# Patient Record
Sex: Male | Born: 1950 | Race: White | Hispanic: No | Marital: Married | Smoking: Never smoker
Health system: Southern US, Community
[De-identification: ages and names within clinical notes are randomized; demographics above are authoritative.]

## PROBLEM LIST (undated history)

## (undated) DIAGNOSIS — C4490 Unspecified malignant neoplasm of skin, unspecified: Secondary | ICD-10-CM

## (undated) DIAGNOSIS — Z789 Other specified health status: Secondary | ICD-10-CM

## (undated) DIAGNOSIS — N183 Chronic kidney disease, stage 3 unspecified: Secondary | ICD-10-CM

## (undated) DIAGNOSIS — J45909 Unspecified asthma, uncomplicated: Secondary | ICD-10-CM

## (undated) DIAGNOSIS — M6530 Trigger finger, unspecified finger: Secondary | ICD-10-CM

## (undated) DIAGNOSIS — K529 Noninfective gastroenteritis and colitis, unspecified: Secondary | ICD-10-CM

## (undated) DIAGNOSIS — R55 Syncope and collapse: Secondary | ICD-10-CM

## (undated) DIAGNOSIS — Z950 Presence of cardiac pacemaker: Secondary | ICD-10-CM

## (undated) DIAGNOSIS — Z9989 Dependence on other enabling machines and devices: Secondary | ICD-10-CM

## (undated) DIAGNOSIS — K635 Polyp of colon: Secondary | ICD-10-CM

## (undated) DIAGNOSIS — N320 Bladder-neck obstruction: Secondary | ICD-10-CM

## (undated) DIAGNOSIS — M1990 Unspecified osteoarthritis, unspecified site: Secondary | ICD-10-CM

## (undated) DIAGNOSIS — C439 Malignant melanoma of skin, unspecified: Secondary | ICD-10-CM

## (undated) DIAGNOSIS — E119 Type 2 diabetes mellitus without complications: Secondary | ICD-10-CM

## (undated) DIAGNOSIS — M109 Gout, unspecified: Secondary | ICD-10-CM

## (undated) DIAGNOSIS — E785 Hyperlipidemia, unspecified: Secondary | ICD-10-CM

## (undated) DIAGNOSIS — N1330 Unspecified hydronephrosis: Secondary | ICD-10-CM

## (undated) DIAGNOSIS — I453 Trifascicular block: Secondary | ICD-10-CM

## (undated) DIAGNOSIS — I10 Essential (primary) hypertension: Secondary | ICD-10-CM

## (undated) DIAGNOSIS — G4733 Obstructive sleep apnea (adult) (pediatric): Secondary | ICD-10-CM

## (undated) HISTORY — DX: Chronic kidney disease, stage 3 unspecified: N18.30

## (undated) HISTORY — DX: Unspecified hydronephrosis: N13.30

## (undated) HISTORY — PX: SKIN CANCER EXCISION: SHX779

## (undated) HISTORY — DX: Trifascicular block: I45.3

## (undated) HISTORY — DX: Bladder-neck obstruction: N32.0

## (undated) HISTORY — DX: Malignant melanoma of skin, unspecified: C43.9

## (undated) HISTORY — DX: Chronic kidney disease, stage 3 (moderate): N18.3

## (undated) HISTORY — DX: Syncope and collapse: R55

---

## 2005-01-17 ENCOUNTER — Ambulatory Visit: Payer: Self-pay | Admitting: Family Medicine

## 2005-02-27 ENCOUNTER — Ambulatory Visit: Payer: Self-pay | Admitting: Family Medicine

## 2006-11-15 ENCOUNTER — Ambulatory Visit: Payer: Self-pay | Admitting: Dermatology

## 2007-08-22 ENCOUNTER — Ambulatory Visit: Payer: Self-pay | Admitting: Gastroenterology

## 2011-12-01 ENCOUNTER — Emergency Department: Payer: Self-pay | Admitting: Emergency Medicine

## 2011-12-14 ENCOUNTER — Ambulatory Visit: Payer: Self-pay | Admitting: Family Medicine

## 2011-12-25 HISTORY — PX: INCISION AND DRAINAGE: SHX5863

## 2012-01-08 ENCOUNTER — Ambulatory Visit: Payer: Self-pay | Admitting: Orthopedic Surgery

## 2012-01-15 ENCOUNTER — Ambulatory Visit: Payer: Self-pay | Admitting: Gastroenterology

## 2012-01-17 LAB — PATHOLOGY REPORT

## 2015-04-17 NOTE — Op Note (Signed)
PATIENT NAME:  Anthony Palmer, Anthony Palmer MR#:  [id number] DATE OF BIRTH:  December 19, 1951  DATE OF PROCEDURE:  01/08/2012  PREOPERATIVE DIAGNOSIS: Hematoma left lower leg.  POSTOPERATIVE DIAGNOSIS: Hematoma left lower leg.    PROCEDURE: Incision and drainage of hematoma left lower leg.   ANESTHESIA: General.   SURGEON: Laurene Footman, MD    PROCEDURE: The patient was brought to the operating room and after adequate anesthesia was obtained, the leg was prepped and draped in the usual sterile fashion. After patient identification and time-out procedures were completed, an approximately 1 cm incision was made just lateral to midline and over the mass which was at the proximal end of the tibia. In spreading the soft tissue, a large amount of blood clot was evacuated from the wound, 50 mL at least. After gentle pressure to evacuate this, the wound was thoroughly irrigated to remove all clot. It did appear to be adequately decompressed. There was no evidence of infection. After pulse irrigation, quarter-inch Penrose drain was placed to prevent recurrence and sutured in place with a single 4-0 nylon suture to partially close the wound. The wound was then dressed with Xeroform, 4 x 4's, Webril, and an Ace wrap for a compressive dressing. The patient was sent to the recovery room in stable condition.   ESTIMATED BLOOD LOSS: 10 mL.   COMPLICATIONS: None.   SPECIMENS: None.   ____________________________ Laurene Footman, MD mjm:drc D: 01/08/2012 19:16:43 ET T: 01/08/2012 20:08:47 ET JOB#: [id number]  cc: Laurene Footman, MD, <Dictator> Laurene Footman MD ELECTRONICALLY SIGNED 01/09/2012 17:02

## 2016-07-20 ENCOUNTER — Encounter: Payer: Self-pay | Admitting: *Deleted

## 2016-07-23 ENCOUNTER — Ambulatory Visit
Admission: RE | Admit: 2016-07-23 | Discharge: 2016-07-23 | Disposition: A | Discharge status: Home / Self Care | Payer: Commercial Managed Care - HMO | Source: Ambulatory Visit | Attending: Gastroenterology | Admitting: Gastroenterology

## 2016-07-23 ENCOUNTER — Encounter
Admission: RE | Disposition: A | Discharge status: Home / Self Care | Payer: Self-pay | Source: Ambulatory Visit | Attending: Gastroenterology

## 2016-07-23 ENCOUNTER — Ambulatory Visit: Payer: Commercial Managed Care - HMO | Admitting: Anesthesiology

## 2016-07-23 ENCOUNTER — Encounter: Payer: Self-pay | Admitting: *Deleted

## 2016-07-23 DIAGNOSIS — E119 Type 2 diabetes mellitus without complications: Secondary | ICD-10-CM | POA: Diagnosis not present

## 2016-07-23 DIAGNOSIS — Z79899 Other long term (current) drug therapy: Secondary | ICD-10-CM | POA: Diagnosis not present

## 2016-07-23 DIAGNOSIS — D123 Benign neoplasm of transverse colon: Secondary | ICD-10-CM | POA: Diagnosis not present

## 2016-07-23 DIAGNOSIS — K6389 Other specified diseases of intestine: Secondary | ICD-10-CM | POA: Insufficient documentation

## 2016-07-23 DIAGNOSIS — I10 Essential (primary) hypertension: Secondary | ICD-10-CM | POA: Insufficient documentation

## 2016-07-23 DIAGNOSIS — M1990 Unspecified osteoarthritis, unspecified site: Secondary | ICD-10-CM | POA: Diagnosis not present

## 2016-07-23 DIAGNOSIS — Z86010 Personal history of colonic polyps: Secondary | ICD-10-CM | POA: Diagnosis present

## 2016-07-23 DIAGNOSIS — K5730 Diverticulosis of large intestine without perforation or abscess without bleeding: Secondary | ICD-10-CM | POA: Insufficient documentation

## 2016-07-23 DIAGNOSIS — K529 Noninfective gastroenteritis and colitis, unspecified: Secondary | ICD-10-CM | POA: Diagnosis not present

## 2016-07-23 DIAGNOSIS — D122 Benign neoplasm of ascending colon: Secondary | ICD-10-CM | POA: Diagnosis not present

## 2016-07-23 DIAGNOSIS — N400 Enlarged prostate without lower urinary tract symptoms: Secondary | ICD-10-CM | POA: Insufficient documentation

## 2016-07-23 DIAGNOSIS — E785 Hyperlipidemia, unspecified: Secondary | ICD-10-CM | POA: Insufficient documentation

## 2016-07-23 DIAGNOSIS — Z7984 Long term (current) use of oral hypoglycemic drugs: Secondary | ICD-10-CM | POA: Insufficient documentation

## 2016-07-23 DIAGNOSIS — D124 Benign neoplasm of descending colon: Secondary | ICD-10-CM | POA: Insufficient documentation

## 2016-07-23 HISTORY — DX: Unspecified osteoarthritis, unspecified site: M19.90

## 2016-07-23 HISTORY — PX: COLONOSCOPY WITH PROPOFOL: SHX5780

## 2016-07-23 HISTORY — DX: Trigger finger, unspecified finger: M65.30

## 2016-07-23 HISTORY — DX: Hyperlipidemia, unspecified: E78.5

## 2016-07-23 HISTORY — DX: Essential (primary) hypertension: I10

## 2016-07-23 LAB — GLUCOSE, CAPILLARY: Glucose-Capillary: 113 mg/dL — ABNORMAL HIGH (ref 65–99)

## 2016-07-23 SURGERY — COLONOSCOPY WITH PROPOFOL
Anesthesia: General

## 2016-07-23 MED ORDER — PROPOFOL 10 MG/ML IV BOLUS
INTRAVENOUS | Status: DC | PRN
  Administered 2016-07-23: 30 mg via INTRAVENOUS
  Administered 2016-07-23: 70 mg via INTRAVENOUS

## 2016-07-23 MED ORDER — SODIUM CHLORIDE 0.9 % IV SOLN
INTRAVENOUS | Status: DC
  Administered 2016-07-23: 09:00:00 via INTRAVENOUS

## 2016-07-23 MED ORDER — SODIUM CHLORIDE 0.9 % IV SOLN
INTRAVENOUS | Status: DC
  Administered 2016-07-23: 08:00:00 via INTRAVENOUS

## 2016-07-23 MED ORDER — SODIUM CHLORIDE 0.9 % IV SOLN: INTRAVENOUS | Status: DC

## 2016-07-23 MED ORDER — PROPOFOL 500 MG/50ML IV EMUL
INTRAVENOUS | Status: DC | PRN
  Administered 2016-07-23: 120 ug/kg/min via INTRAVENOUS

## 2016-07-23 MED ORDER — LIDOCAINE 2% (20 MG/ML) 5 ML SYRINGE
INTRAMUSCULAR | Status: DC | PRN
  Administered 2016-07-23: 50 mg via INTRAVENOUS

## 2016-07-23 MED ORDER — MIDAZOLAM HCL 5 MG/5ML IJ SOLN
INTRAMUSCULAR | Status: DC | PRN
  Administered 2016-07-23: 1 mg via INTRAVENOUS

## 2016-07-23 MED ORDER — PHENYLEPHRINE HCL 10 MG/ML IJ SOLN
INTRAMUSCULAR | Status: DC | PRN
  Administered 2016-07-23: 100 ug via INTRAVENOUS

## 2016-07-23 NOTE — Transfer of Care (Signed)
Immediate Anesthesia Transfer of Care Note  Patient: Anthony Palmer  Procedure(s) Performed: Procedure(s): COLONOSCOPY WITH PROPOFOL (N/A)  Patient Location: Endoscopy Unit  Anesthesia Type:General  Level of Consciousness: awake  Airway & Oxygen Therapy: Patient Spontanous Breathing and Patient connected to nasal cannula oxygen  Post-op Assessment: Report given to RN and Post -op Vital signs reviewed and stable  Post vital signs: Reviewed  Last Vitals:  Vitals:   07/23/16 0802 07/23/16 1027  BP: 136/84 105/70  Pulse: 62 66  Resp: 18 18  Temp: 36.1 C     Last Pain:  Vitals:   07/23/16 0802  TempSrc: Tympanic         Complications: No apparent anesthesia complications

## 2016-07-23 NOTE — H&P (Signed)
Outpatient short stay form Pre-procedure 07/23/2016 8:53 AM Anthony Sails MD  Primary Physician: Dr. [place] Pink  Reason for visit:  Colonoscopy  History of present illness:  Patient is a 65 year old male presenting today for colonoscopy. He has personal history of adenomatous colon polyps. His last colonoscopy was in January 2013. He tolerated his prep well. He takes no aspirin or blood thinning agents.    Current Facility-Administered Medications:  .  0.9 %  sodium chloride infusion, , Intravenous, Continuous, Anthony Silvas, MD .  0.9 %  sodium chloride infusion, , Intravenous, Continuous, Anthony Sails, MD, Last Rate: 50 mL/hr at 07/23/16 [id number] .  0.9 %  sodium chloride infusion, , Intravenous, Continuous, Anthony Sails, MD  Prescriptions Prior to Admission  Medication Sig Dispense Refill Last Dose  . allopurinol (ZYLOPRIM) 300 MG tablet Take 300 mg by mouth daily.   07/22/2016 at Unknown time  . enalapril (VASOTEC) 20 MG tablet Take 20 mg by mouth daily.   07/22/2016 at Unknown time  . fluticasone (FLONASE) 50 MCG/ACT nasal spray Place 2 sprays into both nostrils daily.   07/22/2016 at Unknown time  . lovastatin (MEVACOR) 40 MG tablet Take 40 mg by mouth at bedtime.   07/22/2016 at Unknown time  . metFORMIN (GLUCOPHAGE) 500 MG tablet Take by mouth 2 (two) times daily with a meal.   Past Week at Unknown time  . tamsulosin (FLOMAX) 0.4 MG CAPS capsule Take 0.4 mg by mouth daily.   07/22/2016 at Unknown time  . cefUROXime (CEFTIN) 250 MG tablet Take 250 mg by mouth 2 (two) times daily with a meal.   Completed Course at Unknown time     No Known Allergies   Past Medical History:  Diagnosis Date  . Arthritis   . Diabetes mellitus without complication ([place])   . Hyperlipidemia   . Hypertension   . Trigger finger, acquired     Review of systems:      Physical Exam    Heart and lungs: Regular rate and rhythm without rub or gallop, lungs are bilaterally clear.   HEENT: Normocephalic atraumatic eyes are anicteric    Other:     Pertinant exam for procedure: Soft nontender nondistended bowel sounds positive normoactive.    Planned proceedures: Colonoscopy and indicated procedures. I have discussed the risks benefits and complications of procedures to include not limited to bleeding, infection, perforation and the risk of sedation and the patient wishes to proceed. I have discussed the risks benefits and complications of procedures to include not limited to bleeding, infection, perforation and the risk of sedation and the patient wishes to proceed.    Anthony Sails, MD Gastroenterology 07/23/2016  8:53 AM

## 2016-07-23 NOTE — Op Note (Signed)
[facility] Gastroenterology Patient Name: Anthony Palmer Procedure Date: 07/23/2016 8:57 AM MRN: [id number] Account #: [id number] Date of Birth: [birth year] Admit Type: Outpatient Age: 65 Room: [facility] ENDO ROOM 1 Gender: Male Note Status: Finalized Procedure:            Colonoscopy Indications:          Personal history of colonic polyps Providers:            Lollie Sails, MD Referring MD:         Irven Easterly. Kary Kos, MD (Referring MD) Medicines:            Monitored Anesthesia Care Complications:        No immediate complications. Procedure:            Pre-Anesthesia Assessment:                       - ASA Grade Assessment: III - A patient with severe                        systemic disease.                       After obtaining informed consent, the colonoscope was                        passed under direct vision. Throughout the procedure,                        the patient's blood pressure, pulse, and oxygen                        saturations were monitored continuously. The                        Colonoscope was introduced through the anus and                        advanced to the the cecum, identified by appendiceal                        orifice and ileocecal valve. The colonoscopy was                        unusually difficult due to significant looping, a                        tortuous colon and the patient's body habitus.                        Successful completion of the procedure was aided by                        changing the patient to a supine position, changing the                        patient to a prone position and using manual pressure.                        The patient tolerated the procedure well. The quality  of the bowel preparation was fair except the cecum was                        poor. Findings:      The sigmoid colon, descending colon and transverse colon were       significantly redundant.      Multiple  small and large-mouthed diverticula were found in the sigmoid       colon, descending colon, transverse colon and ascending colon.      A 4 mm polyp was found in the descending colon. The polyp was sessile.       The polyp was removed with a cold snare. Resection and retrieval were       complete.      A 2 mm polyp was found in the descending colon. The polyp was sessile.       The polyp was removed with a cold biopsy forceps. Resection and       retrieval were complete.      A 12 mm polyp was found in the descending colon. The polyp was       pedunculated. The polyp was removed with a hot snare. Resection and       retrieval were complete. To prevent bleeding after the polypectomy, one       hemostatic clip was successfully placed. There was no bleeding at the       end of the maneuver.      A 3 mm polyp was found in the hepatic flexure. The polyp was sessile.       The polyp was removed with a hot snare. Resection and retrieval were       complete. To prevent bleeding after the polypectomy, one hemostatic clip       was successfully placed. There was no bleeding at the end of the       maneuver. The polyp was removed with a cold biopsy forceps. Resection       and retrieval were complete.      A 5 mm polyp was found in the ascending colon. The polyp was       semi-pedunculated. The polyp was removed with a cold snare. Resection       and retrieval were complete. To prevent bleeding after the polypectomy,       one hemostatic clip was successfully placed. There was no bleeding at       the end of the maneuver.      A diffuse area of granular mucosa was found at the ileocecal valve.       Biopsies were taken with a cold forceps for histology.      A 6 mm polyp was found in the distal descending colon. The polyp was       semi-pedunculated. The polyp was removed with a cold snare. Resection       and retrieval were complete. To prevent bleeding after the polypectomy,       one hemostatic  clip was successfully placed. There was no bleeding at       the end of the maneuver.      prominant rectal vasculature      The digital rectal exam findings include enlarged prostate. Impression:           - Redundant colon.                       -  Diverticulosis in the sigmoid colon, in the                        descending colon, in the transverse colon and in the                        ascending colon.                       - One 4 mm polyp in the descending colon, removed with                        a cold snare. Resected and retrieved.                       - One 2 mm polyp in the descending colon, removed with                        a cold biopsy forceps. Resected and retrieved.                       - One 12 mm polyp in the descending colon, removed with                        a hot snare. Resected and retrieved. Clip was placed.                       - One 3 mm polyp at the hepatic flexure, removed with a                        hot snare and removed with a cold biopsy forceps.                        Resected and retrieved. Clip was placed.                       - One 5 mm polyp in the ascending colon, removed with a                        cold snare. Resected and retrieved. Clip was placed.                       - Granularity at the ileocecal valve. Biopsied.                       - One 6 mm polyp in the distal descending colon,                        removed with a cold snare. Resected and retrieved. Clip                        was placed.                       - Enlarged prostate found on digital rectal exam. Recommendation:       - Discharge patient to home.                       - Await pathology results.  Procedure Code(s):    --- Professional ---                       [phone], Colonoscopy, flexible; with removal of tumor(s),                        polyp(s), or other lesion(s) by snare technique                       45380, 84, Colonoscopy, flexible; with biopsy, single                         or multiple Diagnosis Code(s):    --- Professional ---                       D12.3, Benign neoplasm of transverse colon (hepatic                        flexure or splenic flexure)                       D12.2, Benign neoplasm of ascending colon                       D12.4, Benign neoplasm of descending colon                       K63.89, Other specified diseases of intestine                       Z86.010, Personal history of colonic polyps                       K57.30, Diverticulosis of large intestine without                        perforation or abscess without bleeding                       N40.0, Benign prostatic hyperplasia without lower                        urinary tract symptoms                       Q43.8, Other specified congenital malformations of                        intestine CPT copyright 2016 American Medical Association. All rights reserved. The codes documented in this report are preliminary and upon coder review may  be revised to meet current compliance requirements. Lollie Sails, MD 07/23/2016 10:37:36 AM This report has been signed electronically. Number of Addenda: 0 Note Initiated On: 07/23/2016 8:57 AM Scope Withdrawal Time: 0 hours 25 minutes 41 seconds  Total Procedure Duration: 1 hour 13 minutes 13 seconds       [facility]

## 2016-07-23 NOTE — Anesthesia Postprocedure Evaluation (Signed)
Anesthesia Post Note  Patient: Anthony Palmer  Procedure(s) Performed: Procedure(s) (LRB): COLONOSCOPY WITH PROPOFOL (N/A)  Patient location during evaluation: PACU Anesthesia Type: General Level of consciousness: awake Pain management: pain level controlled Vital Signs Assessment: post-procedure vital signs reviewed and stable Respiratory status: nonlabored ventilation Cardiovascular status: stable Anesthetic complications: no    Last Vitals:  Vitals:   07/23/16 1026 07/23/16 1027  BP: 105/70 105/70  Pulse: 69 66  Resp: 19 18  Temp: (!) 35.7 C     Last Pain:  Vitals:   07/23/16 1026  TempSrc: Tympanic                 VAN STAVEREN,Yaretzy Olazabal

## 2016-07-23 NOTE — Anesthesia Preprocedure Evaluation (Signed)
Anesthesia Evaluation  Patient identified by MRN, date of birth, ID band Patient awake    Reviewed: Allergy & Precautions, NPO status , Patient's Chart, lab work & pertinent test results  Airway Mallampati: II       Dental  (+) Teeth Intact   Pulmonary neg pulmonary ROS,    breath sounds clear to auscultation       Cardiovascular Exercise Tolerance: Good hypertension, Pt. on medications  Rhythm:Regular     Neuro/Psych    GI/Hepatic negative GI ROS, Neg liver ROS,   Endo/Other  diabetes, Type 2, Oral Hypoglycemic Agents  Renal/GU negative Renal ROS     Musculoskeletal   Abdominal (+) + obese,   Peds  Hematology   Anesthesia Other Findings   Reproductive/Obstetrics                             Anesthesia Physical Anesthesia Plan  ASA: III  Anesthesia Plan: General   Post-op Pain Management:    Induction: Intravenous  Airway Management Planned: Natural Airway and Nasal Cannula  Additional Equipment:   Intra-op Plan:   Post-operative Plan:   Informed Consent: I have reviewed the patients History and Physical, chart, labs and discussed the procedure including the risks, benefits and alternatives for the proposed anesthesia with the patient or authorized representative who has indicated his/her understanding and acceptance.     Plan Discussed with: CRNA  Anesthesia Plan Comments:         Anesthesia Quick Evaluation

## 2016-07-24 ENCOUNTER — Encounter: Payer: Self-pay | Admitting: Gastroenterology

## 2016-07-24 LAB — SURGICAL PATHOLOGY

## 2017-09-21 ENCOUNTER — Emergency Department: Payer: 59

## 2017-09-21 ENCOUNTER — Inpatient Hospital Stay
Admission: EM | Admit: 2017-09-21 | Discharge: 2017-09-25 | DRG: 684 | Disposition: A | Discharge status: Home / Self Care | Payer: 59 | Attending: Internal Medicine | Admitting: Internal Medicine

## 2017-09-21 ENCOUNTER — Encounter: Payer: Self-pay | Admitting: Emergency Medicine

## 2017-09-21 DIAGNOSIS — G4733 Obstructive sleep apnea (adult) (pediatric): Secondary | ICD-10-CM | POA: Diagnosis not present

## 2017-09-21 DIAGNOSIS — N179 Acute kidney failure, unspecified: Principal | ICD-10-CM | POA: Diagnosis present

## 2017-09-21 DIAGNOSIS — E119 Type 2 diabetes mellitus without complications: Secondary | ICD-10-CM

## 2017-09-21 DIAGNOSIS — E785 Hyperlipidemia, unspecified: Secondary | ICD-10-CM | POA: Diagnosis not present

## 2017-09-21 DIAGNOSIS — I129 Hypertensive chronic kidney disease with stage 1 through stage 4 chronic kidney disease, or unspecified chronic kidney disease: Secondary | ICD-10-CM | POA: Diagnosis not present

## 2017-09-21 DIAGNOSIS — N281 Cyst of kidney, acquired: Secondary | ICD-10-CM | POA: Diagnosis present

## 2017-09-21 DIAGNOSIS — R310 Gross hematuria: Secondary | ICD-10-CM | POA: Diagnosis present

## 2017-09-21 DIAGNOSIS — N1330 Unspecified hydronephrosis: Secondary | ICD-10-CM | POA: Diagnosis not present

## 2017-09-21 DIAGNOSIS — M109 Gout, unspecified: Secondary | ICD-10-CM

## 2017-09-21 DIAGNOSIS — N183 Chronic kidney disease, stage 3 unspecified: Secondary | ICD-10-CM

## 2017-09-21 DIAGNOSIS — Z7951 Long term (current) use of inhaled steroids: Secondary | ICD-10-CM

## 2017-09-21 DIAGNOSIS — Z79899 Other long term (current) drug therapy: Secondary | ICD-10-CM | POA: Diagnosis not present

## 2017-09-21 DIAGNOSIS — Z7984 Long term (current) use of oral hypoglycemic drugs: Secondary | ICD-10-CM | POA: Diagnosis not present

## 2017-09-21 DIAGNOSIS — N19 Unspecified kidney failure: Secondary | ICD-10-CM

## 2017-09-21 DIAGNOSIS — E1122 Type 2 diabetes mellitus with diabetic chronic kidney disease: Secondary | ICD-10-CM | POA: Diagnosis not present

## 2017-09-21 DIAGNOSIS — R32 Unspecified urinary incontinence: Secondary | ICD-10-CM | POA: Diagnosis present

## 2017-09-21 DIAGNOSIS — R55 Syncope and collapse: Secondary | ICD-10-CM | POA: Diagnosis present

## 2017-09-21 DIAGNOSIS — N320 Bladder-neck obstruction: Secondary | ICD-10-CM | POA: Diagnosis not present

## 2017-09-21 DIAGNOSIS — I493 Ventricular premature depolarization: Secondary | ICD-10-CM | POA: Diagnosis present

## 2017-09-21 DIAGNOSIS — I4510 Unspecified right bundle-branch block: Secondary | ICD-10-CM | POA: Diagnosis present

## 2017-09-21 DIAGNOSIS — N3289 Other specified disorders of bladder: Secondary | ICD-10-CM | POA: Diagnosis not present

## 2017-09-21 DIAGNOSIS — I10 Essential (primary) hypertension: Secondary | ICD-10-CM

## 2017-09-21 LAB — TROPONIN I: Troponin I: <0.03 ng/mL (ref <0.03)

## 2017-09-21 LAB — BASIC METABOLIC PANEL
Anion gap: 9 (ref 5–15)
BUN: 45 mg/dL — ABNORMAL HIGH (ref 6–20)
CHLORIDE: 107 mmol/L (ref 101–111)
CO2: 22 mmol/L (ref 22–32)
Calcium: 8.9 mg/dL (ref 8.9–10.3)
Creatinine, Ser: 2.52 mg/dL — ABNORMAL HIGH (ref 0.61–1.24)
GFR calc Af Amer: 29 mL/min — ABNORMAL LOW (ref >60)
GFR calc non Af Amer: 25 mL/min — ABNORMAL LOW (ref >60)
Glucose, Bld: 104 mg/dL — ABNORMAL HIGH (ref 65–99)
POTASSIUM: 4.3 mmol/L (ref 3.5–5.1)
SODIUM: 138 mmol/L (ref 135–145)

## 2017-09-21 LAB — CBC
HCT: 38 % — ABNORMAL LOW (ref 40.0–52.0)
Hemoglobin: 12.8 g/dL — ABNORMAL LOW (ref 13.0–18.0)
MCH: 30.2 pg (ref 26.0–34.0)
MCHC: 33.6 g/dL (ref 32.0–36.0)
MCV: 90 fL (ref 80.0–100.0)
Platelets: 260 10*3/uL (ref 150–440)
RBC: 4.23 MIL/uL — AB (ref 4.40–5.90)
RDW: 14.8 % — ABNORMAL HIGH (ref 11.5–14.5)
WBC: 9.8 10*3/uL (ref 3.8–10.6)

## 2017-09-21 MED ORDER — HEPARIN SODIUM (PORCINE) 5000 UNIT/ML IJ SOLN
5000.0000 [IU] | Freq: Three times a day (TID) | INTRAMUSCULAR | Status: DC
  Administered 2017-09-22 – 2017-09-24 (×10): 5000 [IU] via SUBCUTANEOUS
  Filled 2017-09-21 (×11): qty 1

## 2017-09-21 MED ORDER — ACETAMINOPHEN 325 MG PO TABS: 650.0000 mg | ORAL_TABLET | Freq: Four times a day (QID) | ORAL | Status: DC | PRN

## 2017-09-21 MED ORDER — GLIPIZIDE ER 2.5 MG PO TB24
2.5000 mg | ORAL_TABLET | Freq: Every day | ORAL | Status: DC
  Filled 2017-09-21: qty 1

## 2017-09-21 MED ORDER — TAMSULOSIN HCL 0.4 MG PO CAPS
0.4000 mg | ORAL_CAPSULE | Freq: Every day | ORAL | Status: DC
  Administered 2017-09-22 – 2017-09-25 (×4): 0.4 mg via ORAL
  Filled 2017-09-21 (×4): qty 1

## 2017-09-21 MED ORDER — LOSARTAN POTASSIUM-HCTZ 100-12.5 MG PO TABS: 1.0000 | ORAL_TABLET | Freq: Every day | ORAL | Status: DC

## 2017-09-21 MED ORDER — ACETAMINOPHEN 650 MG RE SUPP: 650.0000 mg | Freq: Four times a day (QID) | RECTAL | Status: DC | PRN

## 2017-09-21 MED ORDER — ALLOPURINOL 300 MG PO TABS
300.0000 mg | ORAL_TABLET | Freq: Every day | ORAL | Status: DC
  Administered 2017-09-22 – 2017-09-25 (×4): 300 mg via ORAL
  Filled 2017-09-21 (×4): qty 1

## 2017-09-21 MED ORDER — ONDANSETRON HCL 4 MG PO TABS: 4.0000 mg | ORAL_TABLET | Freq: Four times a day (QID) | ORAL | Status: DC | PRN

## 2017-09-21 MED ORDER — ONDANSETRON HCL 4 MG/2ML IJ SOLN: 4.0000 mg | Freq: Four times a day (QID) | INTRAMUSCULAR | Status: DC | PRN

## 2017-09-21 MED ORDER — INSULIN ASPART 100 UNIT/ML ~~LOC~~ SOLN: 0.0000 [IU] | Freq: Every day | SUBCUTANEOUS | Status: DC

## 2017-09-21 MED ORDER — SODIUM CHLORIDE 0.9 % IV SOLN
INTRAVENOUS | Status: DC
  Administered 2017-09-22 – 2017-09-25 (×3): via INTRAVENOUS

## 2017-09-21 MED ORDER — INSULIN ASPART 100 UNIT/ML ~~LOC~~ SOLN
0.0000 [IU] | Freq: Three times a day (TID) | SUBCUTANEOUS | Status: DC
  Administered 2017-09-24: 2 [IU] via SUBCUTANEOUS
  Filled 2017-09-21: qty 1

## 2017-09-21 MED ORDER — FLUTICASONE PROPIONATE 50 MCG/ACT NA SUSP
2.0000 | Freq: Every day | NASAL | Status: DC
  Filled 2017-09-21: qty 16

## 2017-09-21 MED ORDER — POLYETHYL GLYCOL-PROPYL GLYCOL 0.4-0.3 % OP GEL
1.0000 "application " | Freq: Every day | OPHTHALMIC | Status: DC
  Filled 2017-09-21: qty 10

## 2017-09-21 MED ORDER — ASPIRIN EC 81 MG PO TBEC
81.0000 mg | DELAYED_RELEASE_TABLET | Freq: Every day | ORAL | Status: DC
  Administered 2017-09-22 – 2017-09-25 (×4): 81 mg via ORAL
  Filled 2017-09-21 (×4): qty 1

## 2017-09-21 MED ORDER — POLYETHYLENE GLYCOL 3350 17 G PO PACK: 17.0000 g | PACK | Freq: Every day | ORAL | Status: DC | PRN

## 2017-09-21 NOTE — ED Triage Notes (Signed)
Pt to rm 4 [facility], report syncopal episode, hit head, hematoma noted to posterior head.  Pt reports this occurring a couple times over past couple weeks.  Pt reports change in DM and HTN medications 1 month ago, stopped metformin and started glipizide, stopped enalapril and started losartan.  Pt NAD at this time.  Pt had syncopal episode while in bed, lasted about 10 seconds, had snoring respirations and body twitched mildly

## 2017-09-21 NOTE — ED Provider Notes (Signed)
[facility] Emergency Department Provider Note  ____________________________________________   First MD Initiated Contact with Patient 09/21/17 2103     (approximate)  I have reviewed the triage vital signs and the nursing notes.   HISTORY  Chief Complaint Loss of Consciousness   HPI Anthony Palmer is a 66 y.o. male with history of diabetes and hypertension who is presenting to the emergency department with several episodes of loss of consciousness. He says that he has been getting feelings in his chest which rise up to his neck which she feels as a wave of warmth. He has been lost consciousness for several seconds. He had an episode while in the emergency department and was witnessed by his nurse where he had snoring respirations for several seconds and then regained consciousness. He had no prolonged episode of confusion. He did not lose bowel or bladder continence. He denies any shortness of breath, chest pain nausea or vomiting. He says that he has been eating and drinking normally. No diarrhea.   Past Medical History:  Diagnosis Date  . Arthritis   . Diabetes mellitus without complication ([place])   . Hyperlipidemia   . Hypertension   . Trigger finger, acquired     There are no active problems to display for this patient.   Past Surgical History:  Procedure Laterality Date  . COLONOSCOPY WITH PROPOFOL N/A 07/23/2016   Procedure: COLONOSCOPY WITH PROPOFOL;  Surgeon: Lollie Sails, MD;  Location: [facility] ENDOSCOPY;  Service: Endoscopy;  Laterality: N/A;    Prior to Admission medications   Medication Sig Start Date End Date Taking? Authorizing Provider  allopurinol (ZYLOPRIM) 300 MG tablet Take 300 mg by mouth daily.    [provider]  cefUROXime (CEFTIN) 250 MG tablet Take 250 mg by mouth 2 (two) times daily with a meal.    [provider]  enalapril (VASOTEC) 20 MG tablet Take 20 mg by mouth daily.    [provider]    fluticasone (FLONASE) 50 MCG/ACT nasal spray Place 2 sprays into both nostrils daily.    [provider]  lovastatin (MEVACOR) 40 MG tablet Take 40 mg by mouth at bedtime.    [provider]  metFORMIN (GLUCOPHAGE) 500 MG tablet Take by mouth 2 (two) times daily with a meal.    [provider]  tamsulosin (FLOMAX) 0.4 MG CAPS capsule Take 0.4 mg by mouth daily.    [provider]    Allergies Patient has no known allergies.  History reviewed. No pertinent family history.  Social History Social History  Substance Use Topics  . Smoking status: Never Smoker  . Smokeless tobacco: Never Used  . Alcohol use No    Review of Systems  Constitutional: No fever/chills Eyes: No visual changes. ENT: No sore throat. Cardiovascular: Denies chest pain. Respiratory: Denies shortness of breath. Gastrointestinal: No abdominal pain.  No nausea, no vomiting.  No diarrhea.  No constipation. Genitourinary: Negative for dysuria. Musculoskeletal: Negative for back pain. Skin: Negative for rash. Neurological: Negative for headaches, focal weakness or numbness.   ____________________________________________   PHYSICAL EXAM:  VITAL SIGNS: ED Triage Vitals  Enc Vitals Group     BP 09/21/17 2058 (!) 164/97     Pulse Rate 09/21/17 2058 80     Resp 09/21/17 2058 18     Temp 09/21/17 2058 98.2 F (36.8 C)     Temp Source 09/21/17 2058 Oral     SpO2 09/21/17 2058 100 %  Weight 09/21/17 2059 240 lb (108.9 kg)     Height 09/21/17 2059 5\' 8"  (1.727 m)     Head Circumference --      Peak Flow --      Pain Score 09/21/17 2053 8     Pain Loc --      Pain Edu? --      Excl. in [place]? --     Constitutional: Alert and oriented. Well appearing and in no acute distress. Eyes: Conjunctivae are normal.  Head: Atraumatic. Nose: No congestion/rhinnorhea. Mouth/Throat: Mucous membranes are moist. no tongue bite. Neck: No stridor.   Cardiovascular: Normal rate,  regular rhythm. Grossly normal heart sounds.  Good peripheral circulation with equal bilateral radial pulses. Respiratory: Normal respiratory effort.  No retractions. Lungs CTAB. Gastrointestinal: Soft and nontender. No distention.  Musculoskeletal: No lower extremity tenderness nor edema.  No joint effusions. Neurologic:  Normal speech and language. No gross focal neurologic deficits are appreciated. Skin:  Skin is warm, dry and intact. No rash noted. Psychiatric: Mood and affect are normal. Speech and behavior are normal.  ____________________________________________   LABS (all labs ordered are listed, but only abnormal results are displayed)  Labs Reviewed  BASIC METABOLIC PANEL - Abnormal; Notable for the following:       Result Value   Glucose, Bld 104 (*)    BUN 45 (*)    Creatinine, Ser 2.52 (*)    GFR calc non Af Amer 25 (*)    GFR calc Af Amer 29 (*)    All other components within normal limits  CBC - Abnormal; Notable for the following:    RBC 4.23 (*)    Hemoglobin 12.8 (*)    HCT 38.0 (*)    RDW 14.8 (*)    All other components within normal limits  TROPONIN I  URINALYSIS, COMPLETE (UACMP) WITH MICROSCOPIC   ____________________________________________  EKG  ED ECG REPORT I, Doran Stabler, the attending physician, personally viewed and interpreted this ECG.   Date: 09/21/2017  EKG Time: 2057  Rate: 81  Rhythm: normal sinus rhythm  Axis: normal  Intervals:right bundle branch block  ST&T Change: LVH with ICD and secondary repolarization abnormality. No sniffing and ST elevation or depression. T-wave inversion in aVL. T-wave inversion in V2. EKG machine read the EKG as an inferior MI. However, I disagree with the elevation of the ST segments here and there is no cervical depression. Furthermore, the patient is calm in a symptomatic at this time.  ____________________________________________  RADIOLOGY  CT head without any acute pathology. Chest x-ray  without any acute pathology. ____________________________________________   PROCEDURES  Procedure(s) performed:   Procedures  Critical Care performed:   ____________________________________________   INITIAL IMPRESSION / ASSESSMENT AND PLAN / ED COURSE  Pertinent labs & imaging results that were available during my care of the patient were reviewed by me and considered in my medical decision making (see chart for details).  DX: Syncope, seizure, ACS    ----------------------------------------- 9:54 PM on 09/21/2017 -----------------------------------------  Patient without any complaints at this time. Intermittent PVCs on the monitor. Multiple episodes of syncope with presyncopal sensation. The patient's wife is also saying that he started having these episodes around the time he started low started about a month ago and that she has noted that arrhythmia is a side effect of losartan. Possible association. Patient will be admitted to the hospital. Signed out to Dr. Claria Dice.  patient is understanding the plan for admission a month to  comply.  ____________________________________________   FINAL CLINICAL IMPRESSION(S) / ED DIAGNOSES  syncope    NEW MEDICATIONS STARTED DURING THIS VISIT:  New Prescriptions   No medications on file     Note:  This document was prepared using Dragon voice recognition software and may include unintentional dictation errors.     Orbie Pyo, MD 09/21/17 2155

## 2017-09-21 NOTE — ED Notes (Signed)
Christine RN, aware of bed assigned.

## 2017-09-21 NOTE — H&P (Signed)
PCP:   [place] Pink, MD   Chief Complaint:  Syncope  HPI: This is a 66 year old male who states that since Sunday he felt as though  WAVE was washing up over him, he states he had a brief episode of loss of consciousness. He was sitting on his couch therefore did not fall, he felt better after. Today he was AT [place] with his family and this recurred, he fell and hit his head. He does not remember falling. This recurred in ER, he had a brief recurrent episode that lasted approximately 10 seconds. It was witnessed. There is no reports of any seizure activity, he was not postictal, he was not incontinent. He denies any chest pains. Of note he's had had 2 changes in medications recently. His metformin was changed to glipizide and his ACE inhibitor was changed to losartan. He denies being lightheaded or dizzy. He states prior to these episodes he denies any fever, chills, nausea, vomiting, diarrhea, altered mentation, burning on urination, weight loss. He is in good health. History provided by the patient as well as his wife and children present at bedside. Last week he states PCP, he was told he had chronic renal disease, nephrology follow-up was arranged  Review of Systems:  The patient denies anorexia, fever, weight loss,, vision loss, decreased hearing, hoarseness, chest pain, syncope, dyspnea on exertion, peripheral edema, balance deficits, hemoptysis, abdominal pain, melena, hematochezia, severe indigestion/heartburn, hematuria, incontinence, syncope, genital sores, muscle weakness, suspicious skin lesions, transient blindness, difficulty walking, depression, unusual weight change, abnormal bleeding, enlarged lymph nodes, angioedema, and breast masses.  Past Medical History: Past Medical History:  Diagnosis Date  . Arthritis   . Diabetes mellitus without complication ([place])   . Hyperlipidemia   . Hypertension   . Trigger finger, acquired    Past Surgical History:  Procedure Laterality Date   . COLONOSCOPY WITH PROPOFOL N/A 07/23/2016   Procedure: COLONOSCOPY WITH PROPOFOL;  Surgeon: Lollie Sails, MD;  Location: [facility] ENDOSCOPY;  Service: Endoscopy;  Laterality: N/A;    Medications: Prior to Admission medications   Medication Sig Start Date End Date Taking? Authorizing Provider  allopurinol (ZYLOPRIM) 300 MG tablet Take 300 mg by mouth daily.   Yes [provider]  glipiZIDE (GLUCOTROL XL) 2.5 MG 24 hr tablet Take 2.5 mg by mouth daily with breakfast.   Yes [provider]  losartan-hydrochlorothiazide (HYZAAR) 100-12.5 MG tablet Take 1 tablet by mouth daily. 08/20/17 08/20/18 Yes [provider]  lovastatin (MEVACOR) 40 MG tablet Take 40 mg by mouth at bedtime.   Yes [provider]  Polyethyl Glycol-Propyl Glycol (SYSTANE OP) Apply 1 drop to eye daily.   Yes [provider]  Polyethyl Glycol-Propyl Glycol (SYSTANE) 0.4-0.3 % GEL ophthalmic gel Place 1 application into both eyes daily.   Yes [provider]  tamsulosin (FLOMAX) 0.4 MG CAPS capsule Take 0.4 mg by mouth daily.   Yes [provider]  cefUROXime (CEFTIN) 250 MG tablet Take 250 mg by mouth 2 (two) times daily with a meal.    [provider]  fluticasone (FLONASE) 50 MCG/ACT nasal spray Place 2 sprays into both nostrils daily.    [provider]    Allergies:  No Known Allergies  Social History:  reports that he has never smoked. He has never used smokeless tobacco. He reports that he does not drink alcohol or use drugs.  Family History: History reviewed.Coronary artery disease  Physical Exam: Vitals:   09/21/17 2059 09/21/17 2130 09/21/17 2200  09/21/17 2230  BP:  (!) 140/97 126/89   Pulse:  97 97 96  Resp:  16 20 (!) 22  Temp:      TempSrc:      SpO2:  100% 100% 97%  Weight: 108.9 kg (240 lb)     Height: 5\' 8"  (1.727 m)       General:  Alert and oriented times three, well developed and nourished, no acute  distress Eyes: PERRLA, pink conjunctiva, no scleral icterus ENT: Moist oral mucosa, neck supple, no thyromegaly Lungs: clear to ascultation, no wheeze, no crackles, no use of accessory muscles Cardiovascular: regular rate and rhythm, no regurgitation, no gallops, no murmurs. No carotid bruits, no JVD Abdomen: soft, positive BS, non-tender, non-distended, no organomegaly, not an acute abdomen GU: not examined Neuro: CN II - XII grossly intact, sensation intact Musculoskeletal: strength 5/5 all extremities, no clubbing, cyanosis or edema Skin: no rash, no subcutaneous crepitation, no decubitus Psych: appropriate patient   Labs on Admission:   Recent Labs  09/21/17 2103  NA 138  K 4.3  CL 107  CO2 22  GLUCOSE 104*  BUN 45*  CREATININE 2.52*  CALCIUM 8.9   No results for input(s): AST, ALT, ALKPHOS, BILITOT, PROT, ALBUMIN in the last 72 hours. No results for input(s): LIPASE, AMYLASE in the last 72 hours.  Recent Labs  09/21/17 2103  WBC 9.8  HGB 12.8*  HCT 38.0*  MCV 90.0  PLT 260    Recent Labs  09/21/17 2103  TROPONINI <0.03   Invalid input(s): POCBNP No results for input(s): DDIMER in the last 72 hours. No results for input(s): HGBA1C in the last 72 hours. No results for input(s): CHOL, HDL, LDLCALC, TRIG, CHOLHDL, LDLDIRECT in the last 72 hours. No results for input(s): TSH, T4TOTAL, T3FREE, THYROIDAB in the last 72 hours.  Invalid input(s): FREET3 No results for input(s): VITAMINB12, FOLATE, FERRITIN, TIBC, IRON, RETICCTPCT in the last 72 hours.  Micro Results: No results found for this or any previous visit (from the past 240 hour(s)).   Radiological Exams on Admission: Dg Chest 1 View  Result Date: 09/21/2017 CLINICAL DATA:  Syncope, weakness. EXAM: CHEST 1 VIEW COMPARISON:  Radiographs of November 15, 2006. FINDINGS: The heart size and mediastinal contours are within normal limits. Both lungs are clear. No pneumothorax or pleural effusion is noted.  The visualized skeletal structures are unremarkable. IMPRESSION: No acute cardiopulmonary abnormality seen. Electronically Signed   By: Marijo Conception, M.D.   On: 09/21/2017 21:30   Ct Head Wo Contrast  Result Date: 09/21/2017 CLINICAL DATA:  Head trauma. EXAM: CT HEAD WITHOUT CONTRAST TECHNIQUE: Contiguous axial images were obtained from the base of the skull through the vertex without intravenous contrast. COMPARISON:  None. FINDINGS: Brain: No evidence of acute infarction, hemorrhage, hydrocephalus, extra-axial collection or mass lesion/mass effect. Vascular: No hyperdense vessel or unexpected calcification. Skull: Normal. Negative for fracture or focal lesion. Sinuses/Orbits: No acute finding. Other: Left posterior scout hematoma is identified. IMPRESSION: 1. No acute intracranial abnormalities. 2. Left posterior scalp hematoma. Electronically Signed   By: Kerby Moors M.D.   On: 09/21/2017 21:51    Assessment/Plan Present on Admission: . Syncope -admit to MedSurg with telemetry -Monitor on telemetry Overnight -Given patient's diabetes mellitus, chronic kidney disease and risk factor is that he could benefit from a cardiac workup inpatient versus outpatient -No evidence of seizures -cycle cardiac enzymes -Orthostatic vitals ordered, IV fluid  Hypertension -stable, home medications resumed  Diabetes mellitus -Home meds  resume, SSI  Gout -stable, home medications resumed  Obstructive sleep apnea -CPAP  Chronic kidney disease -stable, home medications resumed -new diagnoses by PCP. Follow-up with nephrology are arranged  Abeni Finchum 09/21/2017, 11:01 PM

## 2017-09-21 NOTE — ED Notes (Signed)
Pt taken to CT.

## 2017-09-21 NOTE — ED Notes (Signed)
Pt transported to room 146

## 2017-09-21 NOTE — ED Notes (Signed)
Attempted to call report to floor, per floor not ready for pt yet, states they will approve room soon

## 2017-09-22 ENCOUNTER — Observation Stay: Payer: 59

## 2017-09-22 ENCOUNTER — Observation Stay (HOSPITAL_BASED_OUTPATIENT_CLINIC_OR_DEPARTMENT_OTHER)
Admit: 2017-09-22 | Discharge: 2017-09-22 | Disposition: A | Discharge status: Home / Self Care | Payer: 59 | Attending: Internal Medicine | Admitting: Internal Medicine

## 2017-09-22 DIAGNOSIS — G4733 Obstructive sleep apnea (adult) (pediatric): Secondary | ICD-10-CM

## 2017-09-22 DIAGNOSIS — I10 Essential (primary) hypertension: Secondary | ICD-10-CM

## 2017-09-22 DIAGNOSIS — N183 Chronic kidney disease, stage 3 (moderate): Secondary | ICD-10-CM | POA: Diagnosis not present

## 2017-09-22 DIAGNOSIS — E1122 Type 2 diabetes mellitus with diabetic chronic kidney disease: Secondary | ICD-10-CM | POA: Diagnosis not present

## 2017-09-22 DIAGNOSIS — N179 Acute kidney failure, unspecified: Secondary | ICD-10-CM | POA: Diagnosis not present

## 2017-09-22 DIAGNOSIS — R55 Syncope and collapse: Secondary | ICD-10-CM

## 2017-09-22 LAB — CBC
HEMATOCRIT: 38.1 % — AB (ref 40.0–52.0)
HEMOGLOBIN: 12.9 g/dL — AB (ref 13.0–18.0)
MCH: 31 pg (ref 26.0–34.0)
MCHC: 33.8 g/dL (ref 32.0–36.0)
MCV: 91.8 fL (ref 80.0–100.0)
Platelets: 242 10*3/uL (ref 150–440)
RBC: 4.15 MIL/uL — AB (ref 4.40–5.90)
RDW: 14.9 % — ABNORMAL HIGH (ref 11.5–14.5)
WBC: 9.7 10*3/uL (ref 3.8–10.6)

## 2017-09-22 LAB — BASIC METABOLIC PANEL
ANION GAP: 9 (ref 5–15)
BUN: 46 mg/dL — ABNORMAL HIGH (ref 6–20)
CALCIUM: 9.1 mg/dL (ref 8.9–10.3)
CO2: 23 mmol/L (ref 22–32)
Chloride: 108 mmol/L (ref 101–111)
Creatinine, Ser: 2.32 mg/dL — ABNORMAL HIGH (ref 0.61–1.24)
GFR, EST AFRICAN AMERICAN: 32 mL/min — AB (ref >60)
GFR, EST NON AFRICAN AMERICAN: 28 mL/min — AB (ref >60)
Glucose, Bld: 94 mg/dL (ref 65–99)
POTASSIUM: 4.4 mmol/L (ref 3.5–5.1)
Sodium: 140 mmol/L (ref 135–145)

## 2017-09-22 LAB — GLUCOSE, CAPILLARY
GLUCOSE-CAPILLARY: 80 mg/dL (ref 65–99)
GLUCOSE-CAPILLARY: 101 mg/dL — AB (ref 65–99)
Glucose-Capillary: 164 mg/dL — ABNORMAL HIGH (ref 65–99)
Glucose-Capillary: 74 mg/dL (ref 65–99)
Glucose-Capillary: 96 mg/dL (ref 65–99)

## 2017-09-22 LAB — TROPONIN I: Troponin I: <0.03 ng/mL (ref <0.03)

## 2017-09-22 LAB — URINALYSIS, COMPLETE (UACMP) WITH MICROSCOPIC
Bacteria, UA: NONE SEEN
Bilirubin Urine: NEGATIVE
GLUCOSE, UA: NEGATIVE mg/dL
HGB URINE DIPSTICK: NEGATIVE
KETONES UR: NEGATIVE mg/dL
LEUKOCYTES UA: NEGATIVE
Nitrite: NEGATIVE
PH: 5 (ref 5.0–8.0)
PROTEIN: NEGATIVE mg/dL
SQUAMOUS EPITHELIAL / LPF: NONE SEEN
Specific Gravity, Urine: 1.01 (ref 1.005–1.030)

## 2017-09-22 LAB — ECHOCARDIOGRAM COMPLETE
HEIGHTINCHES: 68 in
WEIGHTICAEL: 3628.8 [oz_av]

## 2017-09-22 LAB — MAGNESIUM: MAGNESIUM: 2.2 mg/dL (ref 1.7–2.4)

## 2017-09-22 MED ORDER — LOSARTAN POTASSIUM 50 MG PO TABS
100.0000 mg | ORAL_TABLET | Freq: Every day | ORAL | Status: DC
  Administered 2017-09-22: 100 mg via ORAL
  Filled 2017-09-22: qty 2

## 2017-09-22 MED ORDER — HYDROCHLOROTHIAZIDE 12.5 MG PO CAPS
12.5000 mg | ORAL_CAPSULE | Freq: Every day | ORAL | Status: DC
  Administered 2017-09-22: 12.5 mg via ORAL
  Filled 2017-09-22: qty 1

## 2017-09-22 NOTE — Consult Note (Signed)
Cardiology Consultation:   Anthony Palmer ID: Anthony Anthony Palmer; [id number]; 04-15-1951   Admit date: 09/21/2017 Date of Consult: 09/22/2017  Primary Care Provider: [place] Pink, MD Primary Cardiologist: New to [facility] Reason for consult: Syncope Physician placing consult: Anthony Anthony Palmer   Anthony Palmer Profile:   Anthony Anthony Palmer is a 66 y.o. male with a hx of Diabetes, hypertension, who presents to [facility] hospital after episode of syncope.   History of Present Illness:   Anthony Anthony Palmer report several episodes of near syncope and syncope Around Anthony end of August, beginning of September he had episode of near syncope He was in standing position, symptoms resolved without intervention Felt like a wave coming up his chest into his neck  Head recurrent episode of near syncope approximately one week ago Again symptoms resolved  Episode of syncope yesterday while he was out shopping, felt hot in his chest going up to his neck [place] down hit Anthony back of his head, He reports waking up immediately, was unaware that he had fallen, no postictal symptoms, no seizure type activity, no tongue biting Taken to Anthony emergency room Notes reviewed indicating he had similar episode while in supine position in Anthony emergency room, was not on telemetry at Anthony time. Within 10 seconds see had made full recovery  Review of previous records shows worsening renal function over Anthony past year Previously with normal creatinine summer of 2017 Creatinine 1.3 12/26/2016 Follow-up creatinine 1.5 in summer 2018 August creatinine 2.2 Now creatinine 2.5  Previously on enalapril 20 mg, changed to losartan and of August 2018 Placed on high-dose losartan and HCTZ in Anthony emergency room last night   Past Medical History:  Diagnosis Date  . Arthritis   . Diabetes mellitus without complication ([place])   . Hyperlipidemia   . Hypertension   . Trigger finger, acquired     Past Surgical History:  Procedure Laterality Date  . COLONOSCOPY WITH  PROPOFOL N/A 07/23/2016   Procedure: COLONOSCOPY WITH PROPOFOL;  Surgeon: Lollie Sails, MD;  Location[facility] ENDOSCOPY;  Service: Endoscopy;  Laterality: N/A;     Home Medications:  Prior to Admission medications   Medication Sig Start Date End Date Taking? Authorizing Provider  allopurinol (ZYLOPRIM) 300 MG tablet Take 300 mg by mouth daily.   Yes [provider]  glipiZIDE (GLUCOTROL XL) 2.5 MG 24 hr tablet Take 2.5 mg by mouth daily with breakfast.   Yes [provider]  losartan-hydrochlorothiazide (HYZAAR) 100-12.5 MG tablet Take 1 tablet by mouth daily. 08/20/17 08/20/18 Yes [provider]  lovastatin (MEVACOR) 40 MG tablet Take 40 mg by mouth at bedtime.   Yes [provider]  Polyethyl Glycol-Propyl Glycol (SYSTANE OP) Apply 1 drop to eye daily.   Yes [provider]  Polyethyl Glycol-Propyl Glycol (SYSTANE) 0.4-0.3 % GEL ophthalmic gel Place 1 application into both eyes daily.   Yes [provider]  tamsulosin (FLOMAX) 0.4 MG CAPS capsule Take 0.4 mg by mouth daily.   Yes [provider]  cefUROXime (CEFTIN) 250 MG tablet Take 250 mg by mouth 2 (two) times daily with a meal.    [provider]  fluticasone (FLONASE) 50 MCG/ACT nasal spray Place 2 sprays into both nostrils daily.    [provider]    Inpatient Medications: Scheduled Meds: . allopurinol  300 mg Oral Daily  . aspirin EC  81 mg Oral Daily  . fluticasone  2 spray Each Nare Daily  . heparin  5,000 Units Subcutaneous Q8H  .  insulin aspart  0-15 Units Subcutaneous TID WC  . insulin aspart  0-5 Units Subcutaneous QHS  . Polyethyl Glycol-Propyl Glycol  1 application Both Eyes Daily  . tamsulosin  0.4 mg Oral Daily   Continuous Infusions: . sodium chloride 75 mL/hr at 09/22/17 0019   PRN Meds: acetaminophen **OR** acetaminophen, ondansetron **OR** ondansetron (ZOFRAN) IV, polyethylene glycol  Allergies:   No Known Allergies  Social  History:   Social History   Social History  . Marital status: Married    Spouse name: N/A  . Number of children: N/A  . Years of education: N/A   Occupational History  . Not on file.   Social History Main Topics  . Smoking status: Never Smoker  . Smokeless tobacco: Never Used  . Alcohol use No  . Drug use: No  . Sexual activity: Not on file   Other Topics Concern  . Not on file   Social History Narrative  . No narrative on file    Family History:   Mother with arrhythmia, required a pacemaker  ROS:  Please see Anthony history of present illness.  Review of Systems  Constitution: Negative for diaphoresis, fever, weakness, malaise/fatigue and night sweats.  HENT: Negative.   Eyes: Negative.   Cardiovascular: Positive for near-syncope and syncope. Negative for chest pain, claudication, cyanosis, dyspnea on exertion, irregular heartbeat, leg swelling, orthopnea, palpitations and paroxysmal nocturnal dyspnea.  Respiratory: Negative for cough, shortness of breath, sleep disturbances due to breathing and wheezing.   Endocrine: Negative.   Hematologic/Lymphatic: Negative.   Skin: Negative.   Musculoskeletal: Negative for falls, joint pain, joint swelling and myalgias.  Gastrointestinal: Negative.   Neurological: Negative for difficulty with concentration, excessive daytime sleepiness, dizziness, focal weakness, light-headedness and numbness.  Psychiatric/Behavioral: Negative.    All other ROS reviewed and negative.     Physical Exam/Data:   Vitals:   09/21/17 2300 09/21/17 2330 09/21/17 2353 09/22/17 0714  BP: (!) 125/94 125/83 136/80 126/82  Pulse: (!) 104 98 97 68  Resp: 18 (!) 22 18 19   Temp:   99.1 F (37.3 C) 97.8 F (36.6 C)  TempSrc:   Oral Oral  SpO2: 100% 97% 96% 100%  Weight:   226 lb 12.8 oz (102.9 kg)   Height:   5\' 8"  (1.727 m)     Intake/Output Summary (Last 24 hours) at 09/22/17 1638 Last data filed at 09/22/17 1501  Gross per 24 hour  Intake            1222.5 ml  Output              650 ml  Net            572.5 ml   Filed Weights   09/21/17 2059 09/21/17 2353  Weight: 240 lb (108.9 kg) 226 lb 12.8 oz (102.9 kg)   Body mass index is 34.48 kg/m.  General:  Well nourished, well developed, in no acute distress HEENT: normal Lymph: no adenopathy Neck: no JVD Endocrine:  No thryomegaly Vascular: No carotid bruits; FA pulses 2+ bilaterally without bruits  Cardiac:  normal S1, S2; RRR; no murmur  Lungs:  clear to auscultation bilaterally, no wheezing, rhonchi or rales  Abd: soft, nontender, no hepatomegaly  Ext: no edema Musculoskeletal:  No deformities, BUE and BLE strength normal and equal Skin: warm and dry  Neuro:  CNs 2-12 intact, no focal abnormalities noted Psych:  Normal affect   EKG:  Anthony EKG was personally reviewed and demonstrates:  Normal sinus rhythm with no significant ST or T-wave changes, right bundle branch block, left anterior fascicular block  Telemetry:  Telemetry was personally reviewed and demonstrates:  Normal sinus rhythm, no significant arrhythmia noted  Relevant CV Studies: Echocardiogram Normal LV function, normal right heart pressures, no significant valve disease  Laboratory Data:  Chemistry Recent Labs Lab 09/21/17 2103 09/22/17 0406  NA 138 140  K 4.3 4.4  CL 107 108  CO2 22 23  GLUCOSE 104* 94  BUN 45* 46*  CREATININE 2.52* 2.32*  CALCIUM 8.9 9.1  GFRNONAA 25* 28*  GFRAA 29* 32*  ANIONGAP 9 9    No results for input(s): PROT, ALBUMIN, AST, ALT, ALKPHOS, BILITOT in Anthony last 168 hours. Hematology Recent Labs Lab 09/21/17 2103 09/22/17 0406  WBC 9.8 9.7  RBC 4.23* 4.15*  HGB 12.8* 12.9*  HCT 38.0* 38.1*  MCV 90.0 91.8  MCH 30.2 31.0  MCHC 33.6 33.8  RDW 14.8* 14.9*  PLT 260 242   Cardiac Enzymes Recent Labs Lab 09/21/17 2103 09/22/17 0406  TROPONINI <0.03 <0.03   No results for input(s): TROPIPOC in Anthony last 168 hours.  BNPNo results for input(s): BNP, PROBNP in Anthony  last 168 hours.  DDimer No results for input(s): DDIMER in Anthony last 168 hours.  Radiology/Studies:  Dg Chest 1 View  Result Date: 09/21/2017 CLINICAL DATA:  Syncope, weakness. EXAM: CHEST 1 VIEW COMPARISON:  Radiographs of November 15, 2006. FINDINGS: Anthony heart size and mediastinal contours are within normal limits. Both lungs are clear. No pneumothorax or pleural effusion is noted. Anthony visualized skeletal structures are unremarkable. IMPRESSION: No acute cardiopulmonary abnormality seen. Electronically Signed   By: Marijo Conception, M.D.   On: 09/21/2017 21:30   Ct Head Wo Contrast  Result Date: 09/21/2017 CLINICAL DATA:  Head trauma. EXAM: CT HEAD WITHOUT CONTRAST TECHNIQUE: Contiguous axial images were obtained from Anthony base of Anthony skull through Anthony vertex without intravenous contrast. COMPARISON:  None. FINDINGS: Brain: No evidence of acute infarction, hemorrhage, hydrocephalus, extra-axial collection or mass lesion/mass effect. Vascular: No hyperdense vessel or unexpected calcification. Skull: Normal. Negative for fracture or focal lesion. Sinuses/Orbits: No acute finding. Other: Left posterior scout hematoma is identified. IMPRESSION: 1. No acute intracranial abnormalities. 2. Left posterior scalp hematoma. Electronically Signed   By: Kerby Moors M.D.   On: 09/21/2017 21:51   US Carotid Bilateral  Result Date: 09/22/2017 CLINICAL DATA:  Syncopal episode. History of hypertension and diabetes. EXAM: BILATERAL CAROTID DUPLEX ULTRASOUND TECHNIQUE: Pearline Cables scale imaging, color Doppler and duplex ultrasound were performed of bilateral carotid and vertebral arteries in Anthony neck. COMPARISON:  None. FINDINGS: Criteria: Quantification of carotid stenosis is based on velocity parameters that correlate Anthony residual internal carotid diameter with NASCET-based stenosis levels, using Anthony diameter of Anthony distal internal carotid lumen as Anthony denominator for stenosis measurement. Anthony following velocity measurements  were obtained: RIGHT ICA:  84/36 cm/sec CCA:  027/74 cm/sec SYSTOLIC ICA/CCA RATIO:  0.8 DIASTOLIC ICA/CCA RATIO:  1.9 ECA:  100 cm/sec LEFT ICA:  74/25 cm/sec CCA:  128/78 cm/sec SYSTOLIC ICA/CCA RATIO:  0.7 DIASTOLIC ICA/CCA RATIO:  0.9 ECA:  113 cm/sec RIGHT CAROTID ARTERY: There is no grayscale evidence significant intimal thickening or atherosclerotic plaque affecting interrogated portions of Anthony right carotid system. There are no elevated peak systolic velocities within Anthony interrogated course of Anthony right internal carotid artery to suggest a hemodynamically significant stenosis. RIGHT VERTEBRAL ARTERY:  Antegrade flow LEFT CAROTID ARTERY: There is no grayscale evidence  of significant intimal thickening or atherosclerotic plaque affecting interrogated portions of Anthony left carotid system. There are no elevated peak systolic velocities within Anthony interrogated course of Anthony left internal carotid artery to suggest a hemodynamically significant stenosis. Anthony left internal carotid artery is noted to be tortuous at its origin and proximal aspects (image 45). LEFT VERTEBRAL ARTERY:  Antegrade flow IMPRESSION: Unremarkable carotid Doppler ultrasound. Electronically Signed   By: Sandi Mariscal M.D.   On: 09/22/2017 09:49    Assessment and Plan:   1. Syncope Etiology unclear, concerning for arrhythmia No documentation of seizure type activity Echocardiogram showing normal LV function No significant hemodynamically significant carotid disease -Would continue on telemetry,  30 day monitor at  Discharge  If no arrhythmia on telemetry This will be ordered as outpatient by our office Less likely ischemia  2. Acute renal failure Worse over Anthony past year, normal creatinine now 2.5 on arrival Losartan held Was placed on HCTZ in Anthony emergency room, this was held Would monitor her blood pressure on no medication for now Agree with continued gentle IV fluids Consider renal ultrasound, including renal arteries  3.  Diabetes type 2 Primary care, on glipizide Metformin previously held for renal dysfunction  4. OSA  on CPAP  Long discussion with Anthony Palmer and family at bedside, as well as Dr. Bridgett Larsson  Total encounter time more than 110 minutes  Greater than 50% was spent in counseling and coordination of care with Anthony Anthony Palmer   For questions or updates, please contact [place] Please consult [email or web address] for contact info under Cardiology/STEMI.   Signed, Ida Rogue, MD  09/22/2017 4:38 PM

## 2017-09-22 NOTE — Progress Notes (Addendum)
[place] [facility] [place] NAME: Anthony Palmer    MR#:  [id number]  DATE OF BIRTH:  1951-01-23  SUBJECTIVE:  CHIEF COMPLAINT:   Chief Complaint  Patient presents with  . Loss of Consciousness  The patient has no complaints. REVIEW OF SYSTEMS:  Review of Systems  Constitutional: Negative for chills, fever and malaise/fatigue.  HENT: Negative for sore throat.   Eyes: Negative for blurred vision and double vision.  Respiratory: Negative for cough, hemoptysis, shortness of breath, wheezing and stridor.   Cardiovascular: Negative for chest pain, palpitations, orthopnea and leg swelling.  Gastrointestinal: Negative for abdominal pain, blood in stool, diarrhea, melena, nausea and vomiting.  Genitourinary: Negative for dysuria, flank pain and hematuria.  Musculoskeletal: Negative for back pain and joint pain.  Neurological: Negative for dizziness, sensory change, focal weakness, seizures, loss of consciousness, weakness and headaches.  Endo/Heme/Allergies: Negative for polydipsia.  Psychiatric/Behavioral: Negative for depression. The patient is not nervous/anxious.     DRUG ALLERGIES:  No Known Allergies VITALS:  Blood pressure 126/82, pulse 68, temperature 97.8 F (36.6 C), temperature source Oral, resp. rate 19, height 5\' 8"  (1.727 m), weight 226 lb 12.8 oz (102.9 kg), SpO2 100 %. PHYSICAL EXAMINATION:  Physical Exam  Constitutional: He is oriented to person, place, and time and well-developed, well-nourished, and in no distress.  HENT:  Head: Normocephalic.  Mouth/Throat: Oropharynx is clear and moist.  Eyes: Pupils are equal, round, and reactive to light. Conjunctivae and EOM are normal. No scleral icterus.  Neck: Normal range of motion. Neck supple. No JVD present. No tracheal deviation present.  Cardiovascular: Normal rate, regular rhythm and normal heart sounds.  Exam reveals no gallop.   No murmur heard. Pulmonary/Chest: Effort normal  and breath sounds normal. No respiratory distress. He has no wheezes. He has no rales.  Abdominal: Soft. Bowel sounds are normal. He exhibits no distension. There is no tenderness. There is no rebound.  Musculoskeletal: Normal range of motion. He exhibits no edema or tenderness.  Neurological: He is alert and oriented to person, place, and time. No cranial nerve deficit.  Skin: No rash noted. No erythema.  Psychiatric: Affect normal.   LABORATORY PANEL:  Male CBC  Recent Labs Lab 09/22/17 0406  WBC 9.7  HGB 12.9*  HCT 38.1*  PLT 242   ------------------------------------------------------------------------------------------------------------------ Chemistries   Recent Labs Lab 09/22/17 0406  NA 140  K 4.4  CL 108  CO2 23  GLUCOSE 94  BUN 46*  CREATININE 2.32*  CALCIUM 9.1  MG 2.2   RADIOLOGY:  Dg Chest 1 View  Result Date: 09/21/2017 CLINICAL DATA:  Syncope, weakness. EXAM: CHEST 1 VIEW COMPARISON:  Radiographs of November 15, 2006. FINDINGS: The heart size and mediastinal contours are within normal limits. Both lungs are clear. No pneumothorax or pleural effusion is noted. The visualized skeletal structures are unremarkable. IMPRESSION: No acute cardiopulmonary abnormality seen. Electronically Signed   By: Marijo Conception, M.D.   On: 09/21/2017 21:30   Ct Head Wo Contrast  Result Date: 09/21/2017 CLINICAL DATA:  Head trauma. EXAM: CT HEAD WITHOUT CONTRAST TECHNIQUE: Contiguous axial images were obtained from the base of the skull through the vertex without intravenous contrast. COMPARISON:  None. FINDINGS: Brain: No evidence of acute infarction, hemorrhage, hydrocephalus, extra-axial collection or mass lesion/mass effect. Vascular: No hyperdense vessel or unexpected calcification. Skull: Normal. Negative for fracture or focal lesion. Sinuses/Orbits: No acute finding. Other: Left posterior scout hematoma is identified. IMPRESSION: 1.  No acute intracranial abnormalities. 2.  Left posterior scalp hematoma. Electronically Signed   By: Kerby Moors M.D.   On: 09/21/2017 21:51   US Carotid Bilateral  Result Date: 09/22/2017 CLINICAL DATA:  Syncopal episode. History of hypertension and diabetes. EXAM: BILATERAL CAROTID DUPLEX ULTRASOUND TECHNIQUE: Pearline Cables scale imaging, color Doppler and duplex ultrasound were performed of bilateral carotid and vertebral arteries in the neck. COMPARISON:  None. FINDINGS: Criteria: Quantification of carotid stenosis is based on velocity parameters that correlate the residual internal carotid diameter with NASCET-based stenosis levels, using the diameter of the distal internal carotid lumen as the denominator for stenosis measurement. The following velocity measurements were obtained: RIGHT ICA:  84/36 cm/sec CCA:  287/86 cm/sec SYSTOLIC ICA/CCA RATIO:  0.8 DIASTOLIC ICA/CCA RATIO:  1.9 ECA:  100 cm/sec LEFT ICA:  74/25 cm/sec CCA:  767/20 cm/sec SYSTOLIC ICA/CCA RATIO:  0.7 DIASTOLIC ICA/CCA RATIO:  0.9 ECA:  113 cm/sec RIGHT CAROTID ARTERY: There is no grayscale evidence significant intimal thickening or atherosclerotic plaque affecting interrogated portions of the right carotid system. There are no elevated peak systolic velocities within the interrogated course of the right internal carotid artery to suggest a hemodynamically significant stenosis. RIGHT VERTEBRAL ARTERY:  Antegrade flow LEFT CAROTID ARTERY: There is no grayscale evidence of significant intimal thickening or atherosclerotic plaque affecting interrogated portions of the left carotid system. There are no elevated peak systolic velocities within the interrogated course of the left internal carotid artery to suggest a hemodynamically significant stenosis. The left internal carotid artery is noted to be tortuous at its origin and proximal aspects (image 45). LEFT VERTEBRAL ARTERY:  Antegrade flow IMPRESSION: Unremarkable carotid Doppler ultrasound. Electronically Signed   By: Sandi Mariscal M.D.    On: 09/22/2017 09:49   ASSESSMENT AND PLAN:   . Syncope continue telemetry monitor  Echocardiogram, carotid duplex and cardiology consult. May need Holter monitor per Dr. Rockey Situ.  Hypertension -stable, home medications resumed  Diabetes mellitus -Home meds resume, SSI  Gout -stable, home medications resumed  Obstructive sleep apnea -CPAP  ARF on CKD. Baseline Cr. Was 1.3. Hold losartan-as HCTZ., IV fluids to support and follow-up renal US and BMP.  I discussed with Dr, Rockey Situ. Time spent approximately 36 minutes.  All the records are reviewed and case discussed with Care Management/Social Worker. Management plans discussed with the patient, his wife and daughter. and they are in agreement.  CODE STATUS: Full Code  TOTAL TIME TAKING CARE OF THIS PATIENT: 32 minutes.   More than 50% of the time was spent in counseling/coordination of care: YES  POSSIBLE D/C IN 1-2 DAYS, DEPENDING ON CLINICAL CONDITION.   Demetrios Loll M.D on 09/22/2017 at 1:08 PM  Between 7am to 6pm - Pager - [phone]  After 6pm go to [email or web address] - [facility]

## 2017-09-23 LAB — GLUCOSE, CAPILLARY
GLUCOSE-CAPILLARY: 87 mg/dL (ref 65–99)
Glucose-Capillary: 102 mg/dL — ABNORMAL HIGH (ref 65–99)
Glucose-Capillary: 100 mg/dL — ABNORMAL HIGH (ref 65–99)
Glucose-Capillary: 103 mg/dL — ABNORMAL HIGH (ref 65–99)

## 2017-09-23 LAB — BASIC METABOLIC PANEL
Anion gap: 8 (ref 5–15)
BUN: 47 mg/dL — AB (ref 6–20)
CHLORIDE: 111 mmol/L (ref 101–111)
CO2: 20 mmol/L — ABNORMAL LOW (ref 22–32)
CREATININE: 2.49 mg/dL — AB (ref 0.61–1.24)
Calcium: 8.8 mg/dL — ABNORMAL LOW (ref 8.9–10.3)
GFR calc Af Amer: 30 mL/min — ABNORMAL LOW (ref >60)
GFR calc non Af Amer: 26 mL/min — ABNORMAL LOW (ref >60)
GLUCOSE: 118 mg/dL — AB (ref 65–99)
Potassium: 4.6 mmol/L (ref 3.5–5.1)
Sodium: 139 mmol/L (ref 135–145)

## 2017-09-23 MED ORDER — POLYVINYL ALCOHOL 1.4 % OP SOLN
1.0000 [drp] | Freq: Every day | OPHTHALMIC | Status: DC | PRN
  Filled 2017-09-23: qty 15

## 2017-09-23 MED ORDER — PRAVASTATIN SODIUM 20 MG PO TABS
40.0000 mg | ORAL_TABLET | Freq: Every day | ORAL | Status: DC
  Administered 2017-09-23 – 2017-09-24 (×2): 40 mg via ORAL
  Filled 2017-09-23 (×2): qty 2

## 2017-09-23 NOTE — Progress Notes (Signed)
    No pauses noted on telemetry. Continue to monitor. Agree with gentle IV hydration given renal function.

## 2017-09-23 NOTE — Consult Note (Addendum)
Consult: ARF, urinary retention, bilateral hydronephrosis Requested by: Dr. Carlynn Spry   History of Present Illness: 66 yo male admitted for loss of consciousness. He was also noted to have acute renal failure with a creatinine of 2.52. His creatinine has been rising over the past year from a baseline of 24 May 2016. During that time he is also noticed a weaker stream, frequency, urgency, nocturia and enuresis. He's been on tamsulosin for about a year after trying Detrol. He does feel like he empties his bladder. A renal ultrasound was done 09/22/2017 which revealed bilateral Left greater than Right hydroureteronephrosis and a distended bladder of about 1200 mL (pt could not void). The kidneys were echogenic. I reviewed all the images. He denies dysuria or gross hematuria. Neurogenic risk includes diabetes. No peripheral neuropathy. Bladder scan tonight > 999 ml. His PSA was 1.4 06/28/2017.   Also, on the 09/22/2017 ultrasound there was a 3.1 cm complex upper pole right renal cyst. CT with IV contrast was recommended depending on the patient's renal function.   Past Medical History:  Diagnosis Date  . Arthritis   . Diabetes mellitus without complication ([place])   . Hyperlipidemia   . Hypertension   . Trigger finger, acquired    Past Surgical History:  Procedure Laterality Date  . COLONOSCOPY WITH PROPOFOL N/A 07/23/2016   Procedure: COLONOSCOPY WITH PROPOFOL;  Surgeon: Lollie Sails, MD;  Location: [facility] ENDOSCOPY;  Service: Endoscopy;  Laterality: N/A;    Home Medications:  Prescriptions Prior to Admission  Medication Sig Dispense Refill Last Dose  . allopurinol (ZYLOPRIM) 300 MG tablet Take 300 mg by mouth daily.   09/21/2017 at Unknown time  . glipiZIDE (GLUCOTROL XL) 2.5 MG 24 hr tablet Take 2.5 mg by mouth daily with breakfast.   09/21/2017 at Unknown time  . losartan-hydrochlorothiazide (HYZAAR) 100-12.5 MG tablet Take 1 tablet by mouth daily.   09/20/2017 at Unknown time  . lovastatin  (MEVACOR) 40 MG tablet Take 40 mg by mouth at bedtime.   09/20/2017 at Unknown time  . Polyethyl Glycol-Propyl Glycol (SYSTANE OP) Apply 1 drop to eye daily.   09/20/2017 at Unknown time  . Polyethyl Glycol-Propyl Glycol (SYSTANE) 0.4-0.3 % GEL ophthalmic gel Place 1 application into both eyes daily.   09/20/2017 at Unknown time  . tamsulosin (FLOMAX) 0.4 MG CAPS capsule Take 0.4 mg by mouth daily.   09/20/2017 at Unknown time  . cefUROXime (CEFTIN) 250 MG tablet Take 250 mg by mouth 2 (two) times daily with a meal.   Not Taking at Unknown time  . fluticasone (FLONASE) 50 MCG/ACT nasal spray Place 2 sprays into both nostrils daily.   Not Taking at Unknown time   Allergies: No Known Allergies  History reviewed. No pertinent family history. Social History:  reports that he has never smoked. He has never used smokeless tobacco. He reports that he does not drink alcohol or use drugs.  ROS: A complete review of systems was performed.  All systems are negative except for pertinent findings as noted. Review of Systems  All other systems reviewed and are negative.    Physical Exam:  Vital signs in last 24 hours: Temp:  [97.7 F (36.5 C)-98.2 F (36.8 C)] 97.7 F (36.5 C) (10/01 1919) Pulse Rate:  [69-76] 71 (10/01 1919) Resp:  [16-18] 16 (10/01 1919) BP: (131-141)/(75-86) 131/75 (10/01 1919) SpO2:  [99 %-100 %] 99 % (10/01 1919) General:  Alert and oriented, No acute distress HEENT: Normocephalic, atraumatic Neck: No JVD or  lymphadenopathy Cardiovascular: Regular rate and rhythm Lungs: Regular rate and effort Abdomen: Soft, nontender, nondistended, no abdominal masses Back: No CVA tenderness Extremities: No edema Neurologic: Grossly intact  I discussed the nature risks benefits and alternatives to a Foley catheter and I recommended it. Patient elected to proceed. Procedure: The penis was prepped and draped in the usual sterile fashion. An 44 French coud catheter was passed without  difficulty and 1300 mL of urine was drained. Urine was clear.  Laboratory Data:  Results for orders placed or performed during the hospital encounter of 09/21/17 (from the past 24 hour(s))  Glucose, capillary     Status: Abnormal   Collection Time: 09/22/17 10:34 PM  Result Value Ref Range   Glucose-Capillary 101 (H) 65 - 99 mg/dL   Comment 1 Notify RN   Basic metabolic panel     Status: Abnormal   Collection Time: 09/23/17  3:34 AM  Result Value Ref Range   Sodium 139 135 - 145 mmol/L   Potassium 4.6 3.5 - 5.1 mmol/L   Chloride 111 101 - 111 mmol/L   CO2 20 (L) 22 - 32 mmol/L   Glucose, Bld 118 (H) 65 - 99 mg/dL   BUN 47 (H) 6 - 20 mg/dL   Creatinine, Ser 2.49 (H) 0.61 - 1.24 mg/dL   Calcium 8.8 (L) 8.9 - 10.3 mg/dL   GFR calc non Af Amer 26 (L) >60 mL/min   GFR calc Af Amer 30 (L) >60 mL/min   Anion gap 8 5 - 15  Glucose, capillary     Status: Abnormal   Collection Time: 09/23/17  7:44 AM  Result Value Ref Range   Glucose-Capillary 102 (H) 65 - 99 mg/dL  Glucose, capillary     Status: Abnormal   Collection Time: 09/23/17 11:35 AM  Result Value Ref Range   Glucose-Capillary 100 (H) 65 - 99 mg/dL  Glucose, capillary     Status: Abnormal   Collection Time: 09/23/17  4:37 PM  Result Value Ref Range   Glucose-Capillary 103 (H) 65 - 99 mg/dL   No results found for this or any previous visit (from the past 240 hour(s)). Creatinine:  Recent Labs  09/21/17 2103 09/22/17 0406 09/23/17 0334  CREATININE 2.52* 2.32* 2.49*    Impression/Assessment/Plan: 1) Urinary retention-continue Foley catheter and tamsulosin. He'll need a voiding trial in the office in about a week. I would let the bladder rest for at least 5 days.   2) Acute renal failure-likely related to the hydronephrosis abdomen distended bladder. Trend urine output and creatinine. See below regarding further imaging.   3) Complex right renal cyst-he will not tolerate contrast right now. A noncontrast MRI of the  kidneys might be the best way to go. Also, it would show if the hydronephrosis improved.   Anthony Palmer 09/23/2017, 7:51 PM

## 2017-09-23 NOTE — Progress Notes (Signed)
Pt A&OX4. VSS. Family at bedside. Pt tolerated ambulation to the restroom well. Pt in no acute distress at this time.

## 2017-09-23 NOTE — Progress Notes (Signed)
[place] at [place] NAME: Anthony Palmer    MR#:  [id number]  DATE OF BIRTH:  01-Nov-1951  SUBJECTIVE:  CHIEF COMPLAINT:   Chief Complaint  Patient presents with  . Loss of Consciousness  The patient has no complaints. Creat worsening. Wife at bedside REVIEW OF SYSTEMS:  Review of Systems  Constitutional: Negative for chills, fever and malaise/fatigue.  HENT: Negative for sore throat.   Eyes: Negative for blurred vision and double vision.  Respiratory: Negative for cough, hemoptysis, shortness of breath, wheezing and stridor.   Cardiovascular: Negative for chest pain, palpitations, orthopnea and leg swelling.  Gastrointestinal: Negative for abdominal pain, blood in stool, diarrhea, melena, nausea and vomiting.  Genitourinary: Negative for dysuria, flank pain and hematuria.  Musculoskeletal: Negative for back pain and joint pain.  Neurological: Negative for dizziness, sensory change, focal weakness, seizures, loss of consciousness, weakness and headaches.  Endo/Heme/Allergies: Negative for polydipsia.  Psychiatric/Behavioral: Negative for depression. The patient is not nervous/anxious.    DRUG ALLERGIES:  No Known Allergies VITALS:  Blood pressure (!) 141/86, pulse 76, temperature 98.2 F (36.8 C), temperature source Oral, resp. rate 18, height 5\' 8"  (1.727 m), weight 102.9 kg (226 lb 12.8 oz), SpO2 100 %. PHYSICAL EXAMINATION:  Physical Exam  Constitutional: He is oriented to person, place, and time and well-developed, well-nourished, and in no distress.  HENT:  Head: Normocephalic.  Mouth/Throat: Oropharynx is clear and moist.  Eyes: Pupils are equal, round, and reactive to light. Conjunctivae and EOM are normal. No scleral icterus.  Neck: Normal range of motion. Neck supple. No JVD present. No tracheal deviation present.  Cardiovascular: Normal rate, regular rhythm and normal heart sounds.  Exam reveals no gallop.   No murmur  heard. Pulmonary/Chest: Effort normal and breath sounds normal. No respiratory distress. He has no wheezes. He has no rales.  Abdominal: Soft. Bowel sounds are normal. He exhibits no distension. There is no tenderness. There is no rebound.  Musculoskeletal: Normal range of motion. He exhibits no edema or tenderness.  Neurological: He is alert and oriented to person, place, and time. No cranial nerve deficit.  Skin: No rash noted. No erythema.  Psychiatric: Affect normal.   LABORATORY PANEL:  Male CBC  Recent Labs Lab 09/22/17 0406  WBC 9.7  HGB 12.9*  HCT 38.1*  PLT 242   ------------------------------------------------------------------------------------------------------------------ Chemistries   Recent Labs Lab 09/22/17 0406 09/23/17 0334  NA 140 139  K 4.4 4.6  CL 108 111  CO2 23 20*  GLUCOSE 94 118*  BUN 46* 47*  CREATININE 2.32* 2.49*  CALCIUM 9.1 8.8*  MG 2.2  --    RADIOLOGY:  No results found. ASSESSMENT AND PLAN:   . Syncope - unclear etio, could be cardiac arrythmia - normal Echocardiogram, carotid duplex  - Appreciate cardiology input - May need outpt Holter monitor per Dr. Rockey Situ.  * ARF on CKD. Baseline Cr. Was 1.3.-> now 2.49 - CKD can be due to long standing DM/HTN Hold losartan-as HCTZ - c/s nephro (was scheduled to see Dr Candiss Norse as an outpt on 19th oct) - renal US shows 3.1 cm complex cyst and hydronephrosis as well - will get urology c/s.  * Hypertension -stable, monitor  Diabetes mellitus - SSI  Gout -stable, home medications resumed  Obstructive sleep apnea -CPAP    All the records are reviewed and case discussed with Care Management/Social Worker. Management plans discussed with the patient, his wife  and they are  in agreement.  CODE STATUS: Full Code  TOTAL TIME TAKING CARE OF THIS PATIENT: 32 minutes.   More than 50% of the time was spent in counseling/coordination of care: YES  POSSIBLE D/C IN 1-2 DAYS,  DEPENDING ON CLINICAL CONDITION.   Max Sane M.D on 09/23/2017 at 5:55 PM  Between 7am to 6pm - Pager - [phone]  After 6pm go to [email or web address] - [facility]

## 2017-09-24 ENCOUNTER — Inpatient Hospital Stay: Payer: 59

## 2017-09-24 DIAGNOSIS — E1122 Type 2 diabetes mellitus with diabetic chronic kidney disease: Secondary | ICD-10-CM | POA: Diagnosis present

## 2017-09-24 DIAGNOSIS — N179 Acute kidney failure, unspecified: Secondary | ICD-10-CM | POA: Diagnosis present

## 2017-09-24 DIAGNOSIS — I493 Ventricular premature depolarization: Secondary | ICD-10-CM | POA: Diagnosis present

## 2017-09-24 DIAGNOSIS — R55 Syncope and collapse: Secondary | ICD-10-CM | POA: Diagnosis present

## 2017-09-24 DIAGNOSIS — N281 Cyst of kidney, acquired: Secondary | ICD-10-CM | POA: Diagnosis present

## 2017-09-24 DIAGNOSIS — N320 Bladder-neck obstruction: Secondary | ICD-10-CM | POA: Diagnosis present

## 2017-09-24 DIAGNOSIS — G4733 Obstructive sleep apnea (adult) (pediatric): Secondary | ICD-10-CM | POA: Diagnosis present

## 2017-09-24 DIAGNOSIS — R338 Other retention of urine: Secondary | ICD-10-CM | POA: Diagnosis not present

## 2017-09-24 DIAGNOSIS — Z7984 Long term (current) use of oral hypoglycemic drugs: Secondary | ICD-10-CM | POA: Diagnosis not present

## 2017-09-24 DIAGNOSIS — R32 Unspecified urinary incontinence: Secondary | ICD-10-CM | POA: Diagnosis present

## 2017-09-24 DIAGNOSIS — N19 Unspecified kidney failure: Secondary | ICD-10-CM

## 2017-09-24 DIAGNOSIS — N183 Chronic kidney disease, stage 3 (moderate): Secondary | ICD-10-CM | POA: Diagnosis present

## 2017-09-24 DIAGNOSIS — R310 Gross hematuria: Secondary | ICD-10-CM | POA: Diagnosis present

## 2017-09-24 DIAGNOSIS — I129 Hypertensive chronic kidney disease with stage 1 through stage 4 chronic kidney disease, or unspecified chronic kidney disease: Secondary | ICD-10-CM | POA: Diagnosis present

## 2017-09-24 DIAGNOSIS — E785 Hyperlipidemia, unspecified: Secondary | ICD-10-CM | POA: Diagnosis present

## 2017-09-24 DIAGNOSIS — N3289 Other specified disorders of bladder: Secondary | ICD-10-CM | POA: Diagnosis present

## 2017-09-24 DIAGNOSIS — Z7951 Long term (current) use of inhaled steroids: Secondary | ICD-10-CM | POA: Diagnosis not present

## 2017-09-24 DIAGNOSIS — N1330 Unspecified hydronephrosis: Secondary | ICD-10-CM | POA: Diagnosis present

## 2017-09-24 DIAGNOSIS — I4510 Unspecified right bundle-branch block: Secondary | ICD-10-CM | POA: Diagnosis present

## 2017-09-24 DIAGNOSIS — Z79899 Other long term (current) drug therapy: Secondary | ICD-10-CM | POA: Diagnosis not present

## 2017-09-24 DIAGNOSIS — M109 Gout, unspecified: Secondary | ICD-10-CM | POA: Diagnosis present

## 2017-09-24 DIAGNOSIS — R944 Abnormal results of kidney function studies: Secondary | ICD-10-CM | POA: Diagnosis not present

## 2017-09-24 LAB — CBC
HCT: 38 % — ABNORMAL LOW (ref 40.0–52.0)
Hemoglobin: 12.8 g/dL — ABNORMAL LOW (ref 13.0–18.0)
MCH: 30.6 pg (ref 26.0–34.0)
MCHC: 33.8 g/dL (ref 32.0–36.0)
MCV: 90.5 fL (ref 80.0–100.0)
Platelets: 233 10*3/uL (ref 150–440)
RBC: 4.19 MIL/uL — AB (ref 4.40–5.90)
RDW: 14.9 % — ABNORMAL HIGH (ref 11.5–14.5)
WBC: 7.6 10*3/uL (ref 3.8–10.6)

## 2017-09-24 LAB — BASIC METABOLIC PANEL
Anion gap: 7 (ref 5–15)
BUN: 42 mg/dL — AB (ref 6–20)
CO2: 21 mmol/L — ABNORMAL LOW (ref 22–32)
CREATININE: 2.29 mg/dL — AB (ref 0.61–1.24)
Calcium: 9.1 mg/dL (ref 8.9–10.3)
Chloride: 112 mmol/L — ABNORMAL HIGH (ref 101–111)
GFR, EST AFRICAN AMERICAN: 33 mL/min — AB (ref >60)
GFR, EST NON AFRICAN AMERICAN: 28 mL/min — AB (ref >60)
Glucose, Bld: 108 mg/dL — ABNORMAL HIGH (ref 65–99)
POTASSIUM: 4.8 mmol/L (ref 3.5–5.1)
SODIUM: 140 mmol/L (ref 135–145)

## 2017-09-24 LAB — GLUCOSE, CAPILLARY
GLUCOSE-CAPILLARY: 107 mg/dL — AB (ref 65–99)
GLUCOSE-CAPILLARY: 109 mg/dL — AB (ref 65–99)
GLUCOSE-CAPILLARY: 144 mg/dL — AB (ref 65–99)
GLUCOSE-CAPILLARY: 82 mg/dL (ref 65–99)

## 2017-09-24 MED ORDER — IOPAMIDOL (ISOVUE-300) INJECTION 61%
15.0000 mL | INTRAVENOUS | Status: AC
  Administered 2017-09-24 (×2): 15 mL via ORAL

## 2017-09-24 NOTE — Progress Notes (Signed)
Urology Consult Follow Up  Subjective: Patient without complaint this morning.  Vital signs are stable.  He is afebrile.  UOP is good.  Urine pink tinged.  Cr from early this am is 2.49 up from 2.32 the day before.    Anti-infectives: Anti-infectives    None      Current Facility-Administered Medications  Medication Dose Route Frequency Provider Last Rate Last Dose  . 0.9 %  sodium chloride infusion   Intravenous Continuous Quintella Baton, MD 75 mL/hr at 09/23/17 2051    . acetaminophen (TYLENOL) tablet 650 mg  650 mg Oral Q6H PRN Crosley, Debby, MD       Or  . acetaminophen (TYLENOL) suppository 650 mg  650 mg Rectal Q6H PRN Crosley, Debby, MD      . allopurinol (ZYLOPRIM) tablet 300 mg  300 mg Oral Daily Crosley, Debby, MD   300 mg at 09/23/17 0856  . aspirin EC tablet 81 mg  81 mg Oral Daily Crosley, Debby, MD   81 mg at 09/23/17 0856  . fluticasone (FLONASE) 50 MCG/ACT nasal spray 2 spray  2 spray Each Nare Daily Crosley, Debby, MD      . heparin injection 5,000 Units  5,000 Units Subcutaneous Q8H Quintella Baton, MD   5,000 Units at 09/24/17 0522  . insulin aspart (novoLOG) injection 0-15 Units  0-15 Units Subcutaneous TID WC Crosley, Debby, MD      . insulin aspart (novoLOG) injection 0-5 Units  0-5 Units Subcutaneous QHS Crosley, Debby, MD      . ondansetron (ZOFRAN) tablet 4 mg  4 mg Oral Q6H PRN Crosley, Debby, MD       Or  . ondansetron (ZOFRAN) injection 4 mg  4 mg Intravenous Q6H PRN Crosley, Debby, MD      . polyethylene glycol (MIRALAX / GLYCOLAX) packet 17 g  17 g Oral Daily PRN Crosley, Debby, MD      . polyvinyl alcohol (LIQUIFILM TEARS) 1.4 % ophthalmic solution 1 drop  1 drop Both Eyes Daily PRN Crosley, Debby, MD      . pravastatin (PRAVACHOL) tablet 40 mg  40 mg Oral q1800 Max Sane, MD   40 mg at 09/23/17 1717  . tamsulosin (FLOMAX) capsule 0.4 mg  0.4 mg Oral Daily Crosley, Debby, MD   0.4 mg at 09/23/17 0856     Objective: Vital signs in last 24  hours: Temp:  [97.7 F (36.5 C)-98.2 F (36.8 C)] 97.7 F (36.5 C) (10/02 0707) Pulse Rate:  [71-81] 76 (10/02 0707) Resp:  [16-18] 16 (10/02 0243) BP: (129-141)/(71-86) 133/81 (10/02 0707) SpO2:  [97 %-100 %] 97 % (10/02 0707)  Intake/Output from previous day: 10/01 0701 - 10/02 0700 In: 1905 [P.O.:480; I.V.:1425] Out: 3400 [Urine:3400] Intake/Output this shift: No intake/output data recorded.   Physical Exam Constitutional: Well nourished. Alert and oriented, No acute distress. HEENT: [place] AT, moist mucus membranes. Trachea midline, no masses. Cardiovascular: No clubbing, cyanosis, or edema. Respiratory: Normal respiratory effort, no increased work of breathing. GI: Abdomen is soft, non tender, non distended, no abdominal masses. Liver and spleen not palpable.  No hernias appreciated.  Stool sample for occult testing is not indicated.   GU: No CVA tenderness.  No bladder fullness or masses.  Patient with circumcised phallus. Urethral meatus is patent.  Normal physiologic penile discharge from catheter.  Foley in place.  No penile lesions or rashes. Scrotum without lesions, cysts, rashes and/or edema.   Skin: No rashes, bruises or suspicious lesions. Lymph: No  cervical or inguinal adenopathy. Neurologic: Grossly intact, no focal deficits, moving all 4 extremities. Psychiatric: Normal mood and affect.  Lab Results:   Recent Labs  09/21/17 2103 09/22/17 0406  WBC 9.8 9.7  HGB 12.8* 12.9*  HCT 38.0* 38.1*  PLT 260 242   BMET  Recent Labs  09/22/17 0406 09/23/17 0334  NA 140 139  K 4.4 4.6  CL 108 111  CO2 23 20*  GLUCOSE 94 118*  BUN 46* 47*  CREATININE 2.32* 2.49*  CALCIUM 9.1 8.8*   PT/INR No results for input(s): LABPROT, INR in the last 72 hours. ABG No results for input(s): PHART, HCO3 in the last 72 hours.  Invalid input(s): PCO2, PO2  Studies/Results: US Renal  Result Date: 09/22/2017 CLINICAL DATA:  Renal failure. EXAM: RENAL / URINARY TRACT  ULTRASOUND COMPLETE COMPARISON:  Abdomen CT dated 02/27/2005. FINDINGS: Right Kidney: Length: 14.5 cm. Mildly echogenic. Moderate dilatation of the renal collecting system. Multiple cysts. These include a 3.1 cm upper pole exophytic cyst with a thin and mildly focally thickened internal membrane with hypoechoic material between the membrane and anterior surface of the cyst. There is some internal blood flow within the membrane with color Doppler. There are additional smaller simple appearing cysts. These were not previously present. Left Kidney: Length: 12.2 cm. Mildly echogenic. Moderate to marked dilatation of the collecting system. Small exophytic lower pole simple cysts. Bladder: Markedly distended urinary bladder with a calculated volume of 1163 cc. The patient was unable to void. IMPRESSION: 1. Markedly distended urinary bladder with moderate right and moderate-to-marked left hydronephrosis. 2. 3.1 cm complex upper pole right renal cyst. This is concerning for the possibility of a cystic malignancy. Further evaluation with pre and postcontrast magnetic resonance imaging or pre and postcontrast CT imaging of the kidneys is recommended when the patient's renal function permits. 3. Mildly echogenic kidneys, compatible with mild medical renal disease. Electronically Signed   By: Claudie Revering M.D.   On: 09/22/2017 19:10   US Carotid Bilateral  Result Date: 09/22/2017 CLINICAL DATA:  Syncopal episode. History of hypertension and diabetes. EXAM: BILATERAL CAROTID DUPLEX ULTRASOUND TECHNIQUE: Pearline Cables scale imaging, color Doppler and duplex ultrasound were performed of bilateral carotid and vertebral arteries in the neck. COMPARISON:  None. FINDINGS: Criteria: Quantification of carotid stenosis is based on velocity parameters that correlate the residual internal carotid diameter with NASCET-based stenosis levels, using the diameter of the distal internal carotid lumen as the denominator for stenosis measurement. The  following velocity measurements were obtained: RIGHT ICA:  84/36 cm/sec CCA:  161/09 cm/sec SYSTOLIC ICA/CCA RATIO:  0.8 DIASTOLIC ICA/CCA RATIO:  1.9 ECA:  100 cm/sec LEFT ICA:  74/25 cm/sec CCA:  604/54 cm/sec SYSTOLIC ICA/CCA RATIO:  0.7 DIASTOLIC ICA/CCA RATIO:  0.9 ECA:  113 cm/sec RIGHT CAROTID ARTERY: There is no grayscale evidence significant intimal thickening or atherosclerotic plaque affecting interrogated portions of the right carotid system. There are no elevated peak systolic velocities within the interrogated course of the right internal carotid artery to suggest a hemodynamically significant stenosis. RIGHT VERTEBRAL ARTERY:  Antegrade flow LEFT CAROTID ARTERY: There is no grayscale evidence of significant intimal thickening or atherosclerotic plaque affecting interrogated portions of the left carotid system. There are no elevated peak systolic velocities within the interrogated course of the left internal carotid artery to suggest a hemodynamically significant stenosis. The left internal carotid artery is noted to be tortuous at its origin and proximal aspects (image 45). LEFT VERTEBRAL ARTERY:  Antegrade flow IMPRESSION: Unremarkable  carotid Doppler ultrasound. Electronically Signed   By: Sandi Mariscal M.D.   On: 09/22/2017 09:49     Assessment and Plan  1. Urinary retention  - continue Foley and tamsulosin  - will need a TOV in the office in about one week   2. Acute renal failure  - likely related to the hydronephrosis abdomen distended bladder  - continue to trend urine output and creatinine - if creatinine fails to improve would consider a non contrast CT to evaluate for stone or other reason for obstruction  3. Complex right renal cyst  - will further evaluate as an outpatient as hopefully his renal function will return to baseline and we can pursue a dedicated contrast study  4. Hematuria  - most likely due to the "micro tears" in the bladder caused by the urinary retention as  UA was negative at time of admission  - encouraged the patient to continue his fluid intake  - will continue to monitor     Pt seen and examined on rounds. Hematuria light - agree likely from distention. I think it's best to start with a non-con CT to better eval the Kidneys and the ureters. We can set up an MRI down the road if needed. I spoke to Dr. Manuella Ghazi and he agrees. I also spoke with PA McGowan and agree with her assessment and plan.    LOS: 0 days    [facility] 09/24/2017

## 2017-09-24 NOTE — Consult Note (Signed)
Date: 09/24/2017                  Patient Name:  Anthony Palmer  MRN: [id number]  DOB: [birth year]  Age / Sex: 66 y.o., male         PCP: [place] Pink, MD                 Service Requesting Consult: IM/ Max Sane, MD                 Reason for Consult: ARF            History of Present Illness: Patient is a 66 y.o. male with medical problems of DM-2, HTN, OSA, CKD, who was admitted to [facility] on 09/21/2017 for evaluation of syncope.  He states he was shopping at [place] with his wife when all of a sudden he felt funny and passed out.  He states that he probably lost consciousness less than 10 seconds.  He was back to his usual state after that but decided to come to the hospital for evaluation. Upon presentation his creatinine was noted to be still high at 2.52 His baseline creatinine is 1.0 from June 14, 2016 Since then it has trended up Patient reports he has some issues with incontinence especially at night He has had some difficulty with voiding off-and-on Renal ultrasound done on September 30 showed markedly distended urinary bladder with moderate right and moderate to marked left hydronephrosis.  It also showed a 3.1 cm complex upper pole right renal cyst Patient was evaluated by urologist and an MRI of the kidneys has been ordered.  Foley catheter was placed resulted in significantly improved urine output Iniitially, patient was noted to have blood in the urine, which is clearing now Serum creatinine today is improved compared to yesterday    Medications: Outpatient medications: Prescriptions Prior to Admission  Medication Sig Dispense Refill Last Dose  . allopurinol (ZYLOPRIM) 300 MG tablet Take 300 mg by mouth daily.   09/21/2017 at Unknown time  . glipiZIDE (GLUCOTROL XL) 2.5 MG 24 hr tablet Take 2.5 mg by mouth daily with breakfast.   09/21/2017 at Unknown time  . losartan-hydrochlorothiazide (HYZAAR) 100-12.5 MG tablet Take 1 tablet by mouth daily.   09/20/2017 at  Unknown time  . lovastatin (MEVACOR) 40 MG tablet Take 40 mg by mouth at bedtime.   09/20/2017 at Unknown time  . Polyethyl Glycol-Propyl Glycol (SYSTANE OP) Apply 1 drop to eye daily.   09/20/2017 at Unknown time  . Polyethyl Glycol-Propyl Glycol (SYSTANE) 0.4-0.3 % GEL ophthalmic gel Place 1 application into both eyes daily.   09/20/2017 at Unknown time  . tamsulosin (FLOMAX) 0.4 MG CAPS capsule Take 0.4 mg by mouth daily.   09/20/2017 at Unknown time  . cefUROXime (CEFTIN) 250 MG tablet Take 250 mg by mouth 2 (two) times daily with a meal.   Not Taking at Unknown time  . fluticasone (FLONASE) 50 MCG/ACT nasal spray Place 2 sprays into both nostrils daily.   Not Taking at Unknown time    Current medications: Current Facility-Administered Medications  Medication Dose Route Frequency Provider Last Rate Last Dose  . 0.9 %  sodium chloride infusion   Intravenous Continuous Quintella Baton, MD 75 mL/hr at 09/23/17 2051    . acetaminophen (TYLENOL) tablet 650 mg  650 mg Oral Q6H PRN Crosley, Debby, MD       Or  . acetaminophen (TYLENOL) suppository 650 mg  650 mg Rectal Q6H  PRN Quintella Baton, MD      . allopurinol (ZYLOPRIM) tablet 300 mg  300 mg Oral Daily Crosley, Debby, MD   300 mg at 09/24/17 0847  . aspirin EC tablet 81 mg  81 mg Oral Daily Crosley, Debby, MD   81 mg at 09/24/17 0847  . fluticasone (FLONASE) 50 MCG/ACT nasal spray 2 spray  2 spray Each Nare Daily Crosley, Debby, MD      . heparin injection 5,000 Units  5,000 Units Subcutaneous Q8H Quintella Baton, MD   5,000 Units at 09/24/17 0522  . insulin aspart (novoLOG) injection 0-15 Units  0-15 Units Subcutaneous TID WC Crosley, Debby, MD      . insulin aspart (novoLOG) injection 0-5 Units  0-5 Units Subcutaneous QHS Crosley, Debby, MD      . ondansetron (ZOFRAN) tablet 4 mg  4 mg Oral Q6H PRN Crosley, Debby, MD       Or  . ondansetron (ZOFRAN) injection 4 mg  4 mg Intravenous Q6H PRN Crosley, Debby, MD      . polyethylene glycol (MIRALAX  / GLYCOLAX) packet 17 g  17 g Oral Daily PRN Crosley, Debby, MD      . polyvinyl alcohol (LIQUIFILM TEARS) 1.4 % ophthalmic solution 1 drop  1 drop Both Eyes Daily PRN Crosley, Debby, MD      . pravastatin (PRAVACHOL) tablet 40 mg  40 mg Oral q1800 Max Sane, MD   40 mg at 09/23/17 1717  . tamsulosin (FLOMAX) capsule 0.4 mg  0.4 mg Oral Daily Crosley, Debby, MD   0.4 mg at 09/24/17 0847      Allergies: No Known Allergies    Past Medical History: Past Medical History:  Diagnosis Date  . Arthritis   . Diabetes mellitus without complication ([place])   . Hyperlipidemia   . Hypertension   . Trigger finger, acquired      Past Surgical History: Past Surgical History:  Procedure Laterality Date  . COLONOSCOPY WITH PROPOFOL N/A 07/23/2016   Procedure: COLONOSCOPY WITH PROPOFOL;  Surgeon: Lollie Sails, MD;  Location: [facility] ENDOSCOPY;  Service: Endoscopy;  Laterality: N/A;     Family History: History reviewed. No pertinent family history.   Social History: Social History   Social History  . Marital status: Married    Spouse name: N/A  . Number of children: N/A  . Years of education: N/A   Occupational History  . Not on file.   Social History Main Topics  . Smoking status: Never Smoker  . Smokeless tobacco: Never Used  . Alcohol use No  . Drug use: No  . Sexual activity: Not on file   Other Topics Concern  . Not on file   Social History Narrative  . No narrative on file     Review of Systems: Gen: no fevers or chills or weight loss HEENT: no vision or hearing problems reported CV: no chest pain or shortness of breath Resp: no cough or sputum production GI:no problems with appetite, no nausea or vomiting GU : problems with voiding as described above MS: no acute joint pains reported Derm:  no complaints Psych:no complaints Heme: complaints Neuro: syncope Endocrine.  No complaints  Vital Signs: Blood pressure 133/81, pulse 76, temperature 97.7 F (36.5  C), temperature source Oral, resp. rate 16, height 5\' 8"  (1.727 m), weight 102.9 kg (226 lb 12.8 oz), SpO2 97 %.   Intake/Output Summary (Last 24 hours) at 09/24/17 1037 Last data filed at 09/24/17 0900  Gross per  24 hour  Intake             1785 ml  Output             3800 ml  Net            -2015 ml    Weight trends: [employer]   09/21/17 2059 09/21/17 2353  Weight: 108.9 kg (240 lb) 102.9 kg (226 lb 12.8 oz)    Physical Exam: General:  no acute distress, laying in the bed  HEENT Anicteric, moist oral mucous membranes  Neck:  no masses, no JVD  Lungs: Lungs are clear to auscultation bilaterally  Heart::  soft systolic murmur, regular  Abdomen: Soft, nontender  Extremities:  no peripheral edema  Neurologic: Alert, oriented  Skin: No acute rashes     Foley: In place with blood-tinged urine       Lab results: Basic Metabolic Panel:  Recent Labs Lab 09/22/17 0406 09/23/17 0334 09/24/17 0809  NA 140 139 140  K 4.4 4.6 4.8  CL 108 111 112*  CO2 23 20* 21*  GLUCOSE 94 118* 108*  BUN 46* 47* 42*  CREATININE 2.32* 2.49* 2.29*  CALCIUM 9.1 8.8* 9.1  MG 2.2  --   --     Liver Function Tests: No results for input(s): AST, ALT, ALKPHOS, BILITOT, PROT, ALBUMIN in the last 168 hours. No results for input(s): LIPASE, AMYLASE in the last 168 hours. No results for input(s): AMMONIA in the last 168 hours.  CBC:  Recent Labs Lab 09/22/17 0406 09/24/17 0809  WBC 9.7 7.6  HGB 12.9* 12.8*  HCT 38.1* 38.0*  MCV 91.8 90.5  PLT 242 233    Cardiac Enzymes:  Recent Labs Lab 09/22/17 0406  TROPONINI <0.03    BNP: Invalid input(s): POCBNP  CBG:  Recent Labs Lab 09/23/17 0744 09/23/17 1135 09/23/17 1637 09/23/17 2103 09/24/17 0809  GLUCAP 102* 100* 103* 87 107*    Microbiology: No results found for this or any previous visit (from the past 720 hour(s)).   Coagulation Studies: No results for input(s): LABPROT, INR in the last 72  hours.  Urinalysis:  Recent Labs  09/22/17 0559  COLORURINE YELLOW*  LABSPEC 1.010  PHURINE 5.0  GLUCOSEU NEGATIVE  HGBUR NEGATIVE  BILIRUBINUR NEGATIVE  KETONESUR NEGATIVE  PROTEINUR NEGATIVE  NITRITE NEGATIVE  LEUKOCYTESUR NEGATIVE        Imaging: US Renal  Result Date: 09/22/2017 CLINICAL DATA:  Renal failure. EXAM: RENAL / URINARY TRACT ULTRASOUND COMPLETE COMPARISON:  Abdomen CT dated 02/27/2005. FINDINGS: Right Kidney: Length: 14.5 cm. Mildly echogenic. Moderate dilatation of the renal collecting system. Multiple cysts. These include a 3.1 cm upper pole exophytic cyst with a thin and mildly focally thickened internal membrane with hypoechoic material between the membrane and anterior surface of the cyst. There is some internal blood flow within the membrane with color Doppler. There are additional smaller simple appearing cysts. These were not previously present. Left Kidney: Length: 12.2 cm. Mildly echogenic. Moderate to marked dilatation of the collecting system. Small exophytic lower pole simple cysts. Bladder: Markedly distended urinary bladder with a calculated volume of 1163 cc. The patient was unable to void. IMPRESSION: 1. Markedly distended urinary bladder with moderate right and moderate-to-marked left hydronephrosis. 2. 3.1 cm complex upper pole right renal cyst. This is concerning for the possibility of a cystic malignancy. Further evaluation with pre and postcontrast magnetic resonance imaging or pre and postcontrast CT imaging of the kidneys is recommended when the  patient's renal function permits. 3. Mildly echogenic kidneys, compatible with mild medical renal disease. Electronically Signed   By: Claudie Revering M.D.   On: 09/22/2017 19:10      Assessment & Plan: Pt is a 66 y.o. Caucasian  male with  DM-2, HTN, OSA, CKD, was admitted on 09/21/2017 with syncope and was found to have acute urinary retention and hydronephrosis.   1.  Acute renal failure 2.  Bilateral  hydronephrosis from bladder outlet obstruction 3.  Right upper pole complex renal cyst 4.  Gross hematuria  Acute renal failure is likely secondary to bladder outlet obstruction.  A Foley catheter has been placed.  Patient has gross hematuria which is likely related to bladder stretching versus Foley placement.  Admission urinalysis negative for blood  Continue to watch her serum electrolytes and serum creatinine Outpatient urology followup for voiding trial MRI for complex right renal cyst.

## 2017-09-24 NOTE — Progress Notes (Signed)
Reviewed the CT films with Dr. Erlene Quan.   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  Patient is to be NPO after midnight in preparation for cystoscopy with bilateral retrogrades.  Nurse is notified.     Addendum: CT reviewed. 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 We'll discuss findings with the patient in the a.m. I tentatively added him on to the OR schedule for tomorrow for cystoscopy, bilateral retrograde, bilateral ureteral stents with possible biopsy.  Hollice Espy, MD

## 2017-09-24 NOTE — Progress Notes (Signed)
[facility] [place] NAME: Anthony Palmer    MR#:  [id number]  DATE OF BIRTH:  Dec 20, 1951  SUBJECTIVE:  CHIEF COMPLAINT:   Chief Complaint  Patient presents with  . Loss of Consciousness  The patient has no complaints. Creat improving.  Now has indwelling Foley draining blood-tinged urine REVIEW OF SYSTEMS:  Review of Systems  Constitutional: Negative for chills, fever and malaise/fatigue.  HENT: Negative for sore throat.   Eyes: Negative for blurred vision and double vision.  Respiratory: Negative for cough, hemoptysis, shortness of breath, wheezing and stridor.   Cardiovascular: Negative for chest pain, palpitations, orthopnea and leg swelling.  Gastrointestinal: Negative for abdominal pain, blood in stool, diarrhea, melena, nausea and vomiting.  Genitourinary: Positive for hematuria. Negative for dysuria and flank pain.  Musculoskeletal: Negative for back pain and joint pain.  Neurological: Negative for dizziness, sensory change, focal weakness, seizures, loss of consciousness, weakness and headaches.  Endo/Heme/Allergies: Negative for polydipsia.  Psychiatric/Behavioral: Negative for depression. The patient is not nervous/anxious.    DRUG ALLERGIES:  No Known Allergies VITALS:  Blood pressure 133/81, pulse 76, temperature 97.7 F (36.5 C), temperature source Oral, resp. rate 16, height 5\' 8"  (1.727 m), weight 102.9 kg (226 lb 12.8 oz), SpO2 97 %. PHYSICAL EXAMINATION:  Physical Exam  Constitutional: He is oriented to person, place, and time and well-developed, well-nourished, and in no distress.  HENT:  Head: Normocephalic.  Mouth/Throat: Oropharynx is clear and moist.  Eyes: Pupils are equal, round, and reactive to light. Conjunctivae and EOM are normal. No scleral icterus.  Neck: Normal range of motion. Neck supple. No JVD present. No tracheal deviation present.  Cardiovascular: Normal rate, regular rhythm and normal heart  sounds.  Exam reveals no gallop.   No murmur heard. Pulmonary/Chest: Effort normal and breath sounds normal. No respiratory distress. He has no wheezes. He has no rales.  Abdominal: Soft. Bowel sounds are normal. He exhibits no distension. There is no tenderness. There is no rebound.  Musculoskeletal: Normal range of motion. He exhibits no edema or tenderness.  Neurological: He is alert and oriented to person, place, and time. No cranial nerve deficit.  Skin: No rash noted. No erythema.  Psychiatric: Affect normal.  Foley catheter placed on 09/23/2017 per urology LABORATORY PANEL:  Male CBC  Recent Labs Lab 09/24/17 0809  WBC 7.6  HGB 12.8*  HCT 38.0*  PLT 233   ------------------------------------------------------------------------------------------------------------------ Chemistries   Recent Labs Lab 09/22/17 0406  09/24/17 0809  NA 140  < > 140  K 4.4  < > 4.8  CL 108  < > 112*  CO2 23  < > 21*  GLUCOSE 94  < > 108*  BUN 46*  < > 42*  CREATININE 2.32*  < > 2.29*  CALCIUM 9.1  < > 9.1  MG 2.2  --   --   < > = values in this interval not displayed. RADIOLOGY:  No results found. ASSESSMENT AND PLAN:   . Syncope - unclear etio, could be cardiac arrythmia - normal Echocardiogram, carotid duplex  - Appreciate cardiology input - May need outpt Holter monitor per Dr. Rockey Situ.  * ARF on CKD. Baseline Cr. Was 1.3.-> 2.49 -> 2.29 - CKD can be due to long standing DM/HTN - Hold losartan-as HCTZ - c/s nephro (was scheduled to see Dr Candiss Norse as an outpt on 19th oct) - renal US shows 3.1 cm complex cyst and hydronephrosis as well -Appreciate urology input -  discussed with Dr. Junious Silk recommends CT scan of the abdomen and pelvis without contrast which I have ordered -We will he was requested last evening and was placed draining blood tinged urine  * Hypertension -stable, monitor  Diabetes mellitus - SSI  Gout -stable, home medications resumed  Obstructive sleep  apnea -CPAP    All the records are reviewed and case discussed with Care Management/Social Worker. Management plans discussed with the patient, Dr. Junious Silk and they are in agreement.  CODE STATUS: Full Code  TOTAL TIME TAKING CARE OF THIS PATIENT: 30 minutes.   More than 50% of the time was spent in counseling/coordination of care: YES  POSSIBLE D/C IN 2-3 DAYS, DEPENDING ON CLINICAL CONDITION.  Nephrology and urology evaluation   Max Sane M.D on 09/24/2017 at 1:25 PM  Between 7am to 6pm - Pager - [phone]  After 6pm go to [email or web address] - [facility]

## 2017-09-24 NOTE — Progress Notes (Signed)
Urine is pink, MD aware. VSS. Pt in no acute distress. Will continue to monitor.

## 2017-09-25 ENCOUNTER — Telehealth: Payer: Self-pay | Admitting: Urology

## 2017-09-25 ENCOUNTER — Telehealth: Payer: Self-pay | Admitting: Cardiovascular Disease

## 2017-09-25 ENCOUNTER — Telehealth: Payer: Self-pay | Admitting: *Deleted

## 2017-09-25 ENCOUNTER — Encounter
Admission: EM | Disposition: A | Discharge status: Home / Self Care | Payer: Self-pay | Source: Home / Self Care | Attending: Internal Medicine

## 2017-09-25 DIAGNOSIS — N1339 Other hydronephrosis: Secondary | ICD-10-CM

## 2017-09-25 LAB — BASIC METABOLIC PANEL
ANION GAP: 5 (ref 5–15)
BUN: 33 mg/dL — AB (ref 6–20)
CALCIUM: 9.2 mg/dL (ref 8.9–10.3)
CO2: 23 mmol/L (ref 22–32)
CREATININE: 1.97 mg/dL — AB (ref 0.61–1.24)
Chloride: 112 mmol/L — ABNORMAL HIGH (ref 101–111)
GFR calc Af Amer: 40 mL/min — ABNORMAL LOW (ref >60)
GFR, EST NON AFRICAN AMERICAN: 34 mL/min — AB (ref >60)
GLUCOSE: 107 mg/dL — AB (ref 65–99)
Potassium: 4.6 mmol/L (ref 3.5–5.1)
Sodium: 140 mmol/L (ref 135–145)

## 2017-09-25 LAB — GLUCOSE, CAPILLARY
GLUCOSE-CAPILLARY: 93 mg/dL (ref 65–99)
GLUCOSE-CAPILLARY: 122 mg/dL — AB (ref 65–99)

## 2017-09-25 LAB — CBC
HCT: 38.4 % — ABNORMAL LOW (ref 40.0–52.0)
Hemoglobin: 13 g/dL (ref 13.0–18.0)
MCH: 31.2 pg (ref 26.0–34.0)
MCHC: 33.8 g/dL (ref 32.0–36.0)
MCV: 92.3 fL (ref 80.0–100.0)
PLATELETS: 224 10*3/uL (ref 150–440)
RBC: 4.16 MIL/uL — ABNORMAL LOW (ref 4.40–5.90)
RDW: 14.9 % — AB (ref 11.5–14.5)
WBC: 7.1 10*3/uL (ref 3.8–10.6)

## 2017-09-25 SURGERY — CYSTOSCOPY, WITH RETROGRADE PYELOGRAM
Anesthesia: Choice

## 2017-09-25 NOTE — Progress Notes (Signed)
[facility], [place] 09/25/17  Subjective:   Patient is doing well His serum creatinine is lower at 1.97 Urine output 3950 cc last 24 hours No nausea, vomiting.  Appetite is good.   Objective:  Vital signs in last 24 hours:  Temp:  [98.4 F (36.9 C)-98.6 F (37 C)] 98.4 F (36.9 C) (10/03 0719) Pulse Rate:  [66-78] 66 (10/03 0719) Resp:  [19] 19 (10/02 2051) BP: (122-143)/(82-90) 122/82 (10/03 0719) SpO2:  [98 %-100 %] 100 % (10/03 0719)  Weight change:  Filed Weights   09/21/17 2059 09/21/17 2353  Weight: 108.9 kg (240 lb) 102.9 kg (226 lb 12.8 oz)    Intake/Output:    Intake/Output Summary (Last 24 hours) at 09/25/17 1530 Last data filed at 09/25/17 1241  Gross per 24 hour  Intake          1968.75 ml  Output             4400 ml  Net         -2431.25 ml   Physical Exam: General:  no acute distress, laying in the bed  HEENT Anicteric, moist oral mucous membranes  Neck:  no masses, no JVD  Lungs: Lungs are clear to auscultation bilaterally  Heart::  soft systolic murmur, regular  Abdomen: Soft, nontender  Extremities:  no peripheral edema  Neurologic: Alert, oriented  Skin: No acute rashes     Foley: In place with blood-tinged urine- new urine is clearing     Basic Metabolic Panel:   Recent Labs Lab 09/21/17 2103 09/22/17 0406 09/23/17 0334 09/24/17 0809 09/25/17 0426  NA 138 140 139 140 140  K 4.3 4.4 4.6 4.8 4.6  CL 107 108 111 112* 112*  CO2 22 23 20* 21* 23  GLUCOSE 104* 94 118* 108* 107*  BUN 45* 46* 47* 42* 33*  CREATININE 2.52* 2.32* 2.49* 2.29* 1.97*  CALCIUM 8.9 9.1 8.8* 9.1 9.2  MG  --  2.2  --   --   --      CBC:  Recent Labs Lab 09/21/17 2103 09/22/17 0406 09/24/17 0809 09/25/17 0426  WBC 9.8 9.7 7.6 7.1  HGB 12.8* 12.9* 12.8* 13.0  HCT 38.0* 38.1* 38.0* 38.4*  MCV 90.0 91.8 90.5 92.3  PLT 260 242 233 224     No results found for: HEPBSAG, HEPBSAB, HEPBIGM    Microbiology:  No  results found for this or any previous visit (from the past 240 hour(s)).  Coagulation Studies: No results for input(s): LABPROT, INR in the last 72 hours.  Urinalysis: No results for input(s): COLORURINE, LABSPEC, PHURINE, GLUCOSEU, HGBUR, BILIRUBINUR, KETONESUR, PROTEINUR, UROBILINOGEN, NITRITE, LEUKOCYTESUR in the last 72 hours.  Invalid input(s): APPERANCEUR    Imaging: Ct Abdomen Pelvis Wo Contrast  Result Date: 09/24/2017 CLINICAL DATA:  Hematuria and bladder distention. Hydroureteronephrosis on ultrasound. Ultrasound exam 09/22/2017 EXAM: CT ABDOMEN AND PELVIS WITHOUT CONTRAST TECHNIQUE: Multidetector CT imaging of the abdomen and pelvis was performed following the standard protocol without IV contrast. COMPARISON:  None. FINDINGS: Lower chest: Subsegmental atelectasis noted both lower lobes. Coronary artery atherosclerosis is evident. Hepatobiliary: Insert normal noncontrast crescent No focal abnormality in the liver on this study without intravenous contrast. There is no evidence for gallstones, gallbladder wall thickening, or pericholecystic fluid. No intrahepatic or extrahepatic biliary dilation. Pancreas: No focal mass lesion. No dilatation of the main duct. No intraparenchymal cyst. No peripancreatic edema. Spleen: No splenomegaly. No focal mass lesion. Adrenals/Urinary Tract: No adrenal nodule or mass.  For lesions are identified in the right kidney. The largest is a 2.9 cm exophytic upper pole lesion measuring water attenuation. Two additional anterior exophytic lesions near the junction of the upper and interpolar regions of the right kidney measure 1.6 and 1.8 cm respectively and both approach water attenuation. The fourth lesion is a tiny 12 mm exophytic lesion in the inferior pole of left kidney also measuring water attenuation. Tiny 15 mm exophytic lesion lower pole left kidney measures water attenuation. There is moderate bilateral hydroureteronephrosis. While cross modality  comparison is difficult, the degree of collecting system and ureteral distention appears similar to the ultrasound exam from 09/22/2017. Both ureters remain distended to the level of the UVJ on each side. There is irregular circumferential bladder wall thickening with mild perivesicular edema. Small air bubble identified in the region of the right UVJ could be a tiny bladder diverticulum. Gas in the urinary bladder is compatible with the presence of a Foley catheter. Stomach/Bowel: Stomach is nondistended. No gastric wall thickening. No evidence of outlet obstruction. 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 No small bowel wall thickening. No small bowel dilatation. The terminal ileum is normal. The appendix is normal. Diverticular changes are noted in the left colon without evidence of diverticulitis. Vascular/Lymphatic: There is abdominal aortic atherosclerosis without aneurysm. There is no gastrohepatic or hepatoduodenal ligament lymphadenopathy. No intraperitoneal or retroperitoneal lymphadenopathy. No pelvic sidewall lymphadenopathy. Reproductive: Prostate gland appears enlarged. Other: No intraperitoneal free fluid. Musculoskeletal: Bone windows reveal no worrisome lytic or sclerotic osseous lesions. IMPRESSION: 1. Bilateral moderate hydroureteronephrosis with ureteral distention extending down to the UVJ bilaterally. Irregular circumferential bladder wall thickening is identified and there is associated mild perivesicular edema. Gas in the urinary bladder is likely related to the presence of a Foley catheter. 2. Tiny gas bubbles seen just anterior to the distal right ureter may potentially be gas trapped within a small bladder diverticulum. 3. Bilateral renal cysts measuring up to 2.9 cm in the upper pole of the right kidney. This dominant 2.9 cm lesion corresponds to the 3.1 cm cyst seen by ultrasound. The internal architecture within this lesion visible at ultrasound is not apparent on noncontrast CT and  follow-up will be required as cystic neoplasm cannot be excluded. Electronically Signed   By: Misty Stanley M.D.   On: 09/24/2017 15:07     Medications:   . sodium chloride 75 mL/hr at 09/25/17 0003   . allopurinol  300 mg Oral Daily  . aspirin EC  81 mg Oral Daily  . fluticasone  2 spray Each Nare Daily  . heparin  5,000 Units Subcutaneous Q8H  . insulin aspart  0-15 Units Subcutaneous TID WC  . insulin aspart  0-5 Units Subcutaneous QHS  . pravastatin  40 mg Oral q1800  . tamsulosin  0.4 mg Oral Daily   acetaminophen **OR** acetaminophen, ondansetron **OR** ondansetron (ZOFRAN) IV, polyethylene glycol, polyvinyl alcohol  Assessment/ Plan:  66 y.o.caucasian  male with  DM-2, HTN, OSA, CKD, was admitted on 09/21/2017 with syncope and was found to have acute urinary retention and hydronephrosis.   1.  Acute renal failure 2.  Bilateral hydronephrosis from bladder outlet obstruction 3.  Right upper pole complex renal cyst 4.  Gross hematuria  Acute renal failure is likely secondary to bladder outlet obstruction.  A Foley catheter has been placed.  Patient has gross hematuria which is under evaluation. CT of abdomen shows Bilateral moderate hydroureteronephrosis and ureter distention extending to the UVJ bilaterally.  Irregular circumferential bladder wall  thickening.  Outpatient urology followup for voiding trial and cystoscopy.  that need further imaging for the complex renal cyst. We will followup with the patient as outpatient.   LOS: [place] 10/3/20183:30 PM  [place], [place]

## 2017-09-25 NOTE — Telephone Encounter (Signed)
Please arrange for outpatient f/u next week with voiding trial ([facility] teaching), renal ultrasound and Cr on day prior to visit.     Hollice Espy, MD

## 2017-09-25 NOTE — Telephone Encounter (Signed)
TCM for [facility] ph f/u for syncope needed in 2 weeks Scheduled 11/13 with Dr. Rockey Situ Added to waitlist

## 2017-09-25 NOTE — Progress Notes (Signed)
Urology Consult Follow Up  Subjective: Noncontrast CT scan reviewed, continues to have bilateral moderate hydronephrosis down to the level of the UVJ bilaterally with very thickened bladder wall.  Foley shows bladder decompression. Systolic characterized by CT scan without contrast.  Creatinine is continuing to trend downwards. Briefly considered proceeding to the operating room today for cystoscopy, possible bilateral retrogrades and stent, but insetting of improving creatinine, will hold off at this time.  Anti-infectives: Anti-infectives    None      Current Facility-Administered Medications  Medication Dose Route Frequency Provider Last Rate Last Dose  . 0.9 %  sodium chloride infusion   Intravenous Continuous Quintella Baton, MD 75 mL/hr at 09/25/17 0003    . acetaminophen (TYLENOL) tablet 650 mg  650 mg Oral Q6H PRN Crosley, Debby, MD       Or  . acetaminophen (TYLENOL) suppository 650 mg  650 mg Rectal Q6H PRN Crosley, Debby, MD      . allopurinol (ZYLOPRIM) tablet 300 mg  300 mg Oral Daily Crosley, Debby, MD   300 mg at 09/24/17 0847  . aspirin EC tablet 81 mg  81 mg Oral Daily Crosley, Debby, MD   81 mg at 09/24/17 0847  . fluticasone (FLONASE) 50 MCG/ACT nasal spray 2 spray  2 spray Each Nare Daily Crosley, Debby, MD      . heparin injection 5,000 Units  5,000 Units Subcutaneous Q8H Quintella Baton, MD   5,000 Units at 09/24/17 2120  . insulin aspart (novoLOG) injection 0-15 Units  0-15 Units Subcutaneous TID WC Quintella Baton, MD   2 Units at 09/24/17 1621  . insulin aspart (novoLOG) injection 0-5 Units  0-5 Units Subcutaneous QHS Crosley, Debby, MD      . ondansetron (ZOFRAN) tablet 4 mg  4 mg Oral Q6H PRN Claria Dice, Debby, MD       Or  . ondansetron (ZOFRAN) injection 4 mg  4 mg Intravenous Q6H PRN Crosley, Debby, MD      . polyethylene glycol (MIRALAX / GLYCOLAX) packet 17 g  17 g Oral Daily PRN Crosley, Debby, MD      . polyvinyl alcohol (LIQUIFILM TEARS) 1.4 % ophthalmic  solution 1 drop  1 drop Both Eyes Daily PRN Crosley, Debby, MD      . pravastatin (PRAVACHOL) tablet 40 mg  40 mg Oral q1800 Max Sane, MD   40 mg at 09/24/17 1612  . tamsulosin (FLOMAX) capsule 0.4 mg  0.4 mg Oral Daily Crosley, Debby, MD   0.4 mg at 09/24/17 0847     Objective: Vital signs in last 24 hours: Temp:  [98.4 F (36.9 C)-98.6 F (37 C)] 98.4 F (36.9 C) (10/03 0719) Pulse Rate:  [66-90] 66 (10/03 0719) Resp:  [19] 19 (10/02 2051) BP: (122-151)/(82-91) 122/82 (10/03 0719) SpO2:  [98 %-100 %] 100 % (10/03 0719)  Intake/Output from previous day: 10/02 0701 - 10/03 0700 In: 2088.8 [P.O.:360; I.V.:1728.8] Out: 3950 [Urine:3950] Intake/Output this shift: No intake/output data recorded.   Physical Exam Constitutional: Well nourished. Alert and oriented, No acute distress. HEENT: [place] AT, moist mucus membranes. Trachea midline, no masses. Cardiovascular: No clubbing, cyanosis, or edema. Respiratory: Normal respiratory effort, no increased work of breathing. GI: Abdomen is soft, non tender, non distended. GU: Foley draining clear urine, brown tinged. Neurologic: Grossly intact, no focal deficits, moving all 4 extremities. Psychiatric: Normal mood and affect.  Lab Results:   Recent Labs  09/24/17 0809 09/25/17 0426  WBC 7.6 7.1  HGB 12.8* 13.0  HCT  38.0* 38.4*  PLT 233 224   BMET  Recent Labs  09/24/17 0809 09/25/17 0426  NA 140 140  K 4.8 4.6  CL 112* 112*  CO2 21* 23  GLUCOSE 108* 107*  BUN 42* 33*  CREATININE 2.29* 1.97*  CALCIUM 9.1 9.2    Studies/Results: Ct Abdomen Pelvis Wo Contrast  Result Date: 09/24/2017 CLINICAL DATA:  Hematuria and bladder distention. Hydroureteronephrosis on ultrasound. Ultrasound exam 09/22/2017 EXAM: CT ABDOMEN AND PELVIS WITHOUT CONTRAST TECHNIQUE: Multidetector CT imaging of the abdomen and pelvis was performed following the standard protocol without IV contrast. COMPARISON:  None. FINDINGS: Lower chest:  Subsegmental atelectasis noted both lower lobes. Coronary artery atherosclerosis is evident. Hepatobiliary: Insert normal noncontrast crescent No focal abnormality in the liver on this study without intravenous contrast. There is no evidence for gallstones, gallbladder wall thickening, or pericholecystic fluid. No intrahepatic or extrahepatic biliary dilation. Pancreas: No focal mass lesion. No dilatation of the main duct. No intraparenchymal cyst. No peripancreatic edema. Spleen: No splenomegaly. No focal mass lesion. Adrenals/Urinary Tract: No adrenal nodule or mass. For lesions are identified in the right kidney. The largest is a 2.9 cm exophytic upper pole lesion measuring water attenuation. Two additional anterior exophytic lesions near the junction of the upper and interpolar regions of the right kidney measure 1.6 and 1.8 cm respectively and both approach water attenuation. The fourth lesion is a tiny 12 mm exophytic lesion in the inferior pole of left kidney also measuring water attenuation. Tiny 15 mm exophytic lesion lower pole left kidney measures water attenuation. There is moderate bilateral hydroureteronephrosis. While cross modality comparison is difficult, the degree of collecting system and ureteral distention appears similar to the ultrasound exam from 09/22/2017. Both ureters remain distended to the level of the UVJ on each side. There is irregular circumferential bladder wall thickening with mild perivesicular edema. Small air bubble identified in the region of the right UVJ could be a tiny bladder diverticulum. Gas in the urinary bladder is compatible with the presence of a Foley catheter. Stomach/Bowel: Stomach is nondistended. No gastric wall thickening. No evidence of outlet obstruction. 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 No small bowel wall thickening. No small bowel dilatation. The terminal ileum is normal. The appendix is normal. Diverticular changes are noted in the left colon  without evidence of diverticulitis. Vascular/Lymphatic: There is abdominal aortic atherosclerosis without aneurysm. There is no gastrohepatic or hepatoduodenal ligament lymphadenopathy. No intraperitoneal or retroperitoneal lymphadenopathy. No pelvic sidewall lymphadenopathy. Reproductive: Prostate gland appears enlarged. Other: No intraperitoneal free fluid. Musculoskeletal: Bone windows reveal no worrisome lytic or sclerotic osseous lesions. IMPRESSION: 1. Bilateral moderate hydroureteronephrosis with ureteral distention extending down to the UVJ bilaterally. Irregular circumferential bladder wall thickening is identified and there is associated mild perivesicular edema. Gas in the urinary bladder is likely related to the presence of a Foley catheter. 2. Tiny gas bubbles seen just anterior to the distal right ureter may potentially be gas trapped within a small bladder diverticulum. 3. Bilateral renal cysts measuring up to 2.9 cm in the upper pole of the right kidney. This dominant 2.9 cm lesion corresponds to the 3.1 cm cyst seen by ultrasound. The internal architecture within this lesion visible at ultrasound is not apparent on noncontrast CT and follow-up will be required as cystic neoplasm cannot be excluded. Electronically Signed   By: Misty Stanley M.D.   On: 09/24/2017 15:07   CT scan personally reviewed today  Assessment and Plan  1. Urinary retention  - continue Foley and  tamsulosin  - will need a TOV in the office in about one week              - bladder wall thickening severe, c/w chronic outlet obstruction vs. Infiltrative process   2. Acute renal failure  - improving with Foley             - briefly considered OR today for cystoscopy, B RTG, stents, with possible biopsy, but with continued improvement in Cr, prefer to reassess in one week with repeat labs and repeat renal ultrasound , diet advanced  3. Complex right renal cyst  - will further evaluate as an outpatient as hopefully his  renal function will return to baseline and we can pursue a dedicated contrast study  4. Hematuria  - most likely due to the "micro tears" in the bladder caused by the urinary retention as UA was negative at time of admission  - encouraged the patient to continue his fluid intake  -outpatient cysto  Will arrange appropriate outpatient f/u in 1 week.   LOS: 1 day    Hollice Espy 09/25/2017

## 2017-09-25 NOTE — Discharge Instructions (Signed)
Acute Kidney Injury, Adult Acute kidney injury is a sudden worsening of kidney function. The kidneys are organs that have several jobs. They filter the blood to remove waste products and extra fluid. They also maintain a healthy balance of minerals and hormones in the body, which helps control blood pressure and keep bones strong. With this condition, your kidneys do not do their jobs as well as they should. This condition ranges from mild to severe. Over time it may develop into long-lasting (chronic) kidney disease. Early detection and treatment may prevent acute kidney injury from developing into a chronic condition. What are the causes? Common causes of this condition include:  A problem with blood flow to the kidneys. This may be caused by:  Low blood pressure (hypotension) or shock.  Blood loss.  Heart and blood vessel (cardiovascular) disease.  Severe burns.  Liver disease.  Direct damage to the kidneys. This may be caused by:  Certain medicines.  A kidney infection.  Poisoning.  Being around or in contact with toxic substances.  A surgical wound.  A hard, direct hit to the kidney area.  A sudden blockage of urine flow. This may be caused by:  Cancer.  Kidney stones.  An enlarged prostate in males. What are the signs or symptoms? Symptoms of this condition may not be obvious until the condition becomes severe. Symptoms of this condition can include:  Tiredness (lethargy), or difficulty staying awake.  Nausea or vomiting.  Swelling (edema) of the face, legs, ankles, or feet.  Problems with urination, such as:  Abdominal pain, or pain along the side of your stomach (flank).  Decreased urine production.  Decrease in the force of urine flow.  Muscle twitches and cramps, especially in the legs.  Confusion or trouble concentrating.  Loss of appetite.  Fever. How is this diagnosed? This condition may be diagnosed with tests, including:  Blood  tests.  Urine tests.  Imaging tests.  A test in which a sample of tissue is removed from the kidneys to be examined under a microscope (kidney biopsy). How is this treated? Treatment for this condition depends on the cause and how severe the condition is. In mild cases, treatment may not be needed. The kidneys may heal on their own. In more severe cases, treatment will involve:  Treating the cause of the kidney injury. This may involve changing any medicines you are taking or adjusting your dosage.  Fluids. You may need specialized IV fluids to balance your body's needs.  Having a catheter placed to drain urine and prevent blockages.  Preventing problems from occurring. This may mean avoiding certain medicines or procedures that can cause further injury to the kidneys. In some cases treatment may also require:  A procedure to remove toxic wastes from the body (dialysis or continuous renal replacement therapy - CRRT).  Surgery. This may be done to repair a torn kidney, or to remove the blockage from the urinary system. Follow these instructions at home: Medicines   Take over-the-counter and prescription medicines only as told by your health care provider.  Do not take any new medicines without your health care provider's approval. Many medicines can worsen your kidney damage.  Do not take any vitamin and mineral supplements without your health care provider's approval. Many nutritional supplements can worsen your kidney damage. Lifestyle   If your health care provider prescribed changes to your diet, follow them. You may need to decrease the amount of protein you eat.  Achieve and maintain a   healthy weight. If you need help with this, ask your health care provider.  Start or continue an exercise plan. Try to exercise at least 30 minutes a day, 5 days a week.  Do not use any tobacco products, such as cigarettes, chewing tobacco, and e-cigarettes. If you need help quitting, ask  your health care provider. General instructions   Keep track of your blood pressure. Report changes in your blood pressure as told by your health care provider.  Stay up to date with immunizations. Ask your health care provider which immunizations you need.  Keep all follow-up visits as told by your health care provider. This is important. Where to find more information:  American Association of Kidney Patients: www.aakp.org  National Kidney Foundation: www.kidney.org  American Kidney Fund: www.akfinc.org  [employer] [facility]:  www.lifeoptions.org  www.kidneyschool.org Contact a health care provider if:  Your symptoms get worse.  You develop new symptoms. Get help right away if:  You develop symptoms of worsening kidney disease, which include:  Headaches.  Abnormally dark or light skin.  Easy bruising.  Frequent hiccups.  Chest pain.  Shortness of breath.  End of menstruation in women.  Seizures.  Confusion or altered mental status.  Abdominal or back pain.  Itchiness.  You have a fever.  Your body is producing less urine.  You have pain or bleeding when you urinate. Summary  Acute kidney injury is a sudden worsening of kidney function.  Acute kidney injury can be caused by problems with blood flow to the kidneys, direct damage to the kidneys, and sudden blockage of urine flow.  Symptoms of this condition may not be obvious until it becomes severe. Symptoms may include edema, lethargy, confusion, nausea or vomiting, and problems passing urine.  This condition can usually be diagnosed with blood tests, urine tests, and imaging tests. Sometimes a kidney biopsy is done to diagnose this condition.  Treatment for this condition often involves treating the underlying cause. It is treated with fluids, medicines, dialysis, diet changes, or surgery. This information is not intended to replace advice given to you by your health care provider.  Make sure you discuss any questions you have with your health care provider. Document Released: 06/25/2011 Document Revised: 11/30/2016 Document Reviewed: 11/30/2016 Elsevier Interactive Patient Education  2017 [employer].  

## 2017-09-25 NOTE — Telephone Encounter (Signed)
-----   Message from Anthony Mu, PA-C sent at 09/25/2017 11:06 AM EDT ----- Dr. Rockey Situ is hoping we can get this patient a 30-day event monitor mailed to his house before he discharges. Diagnosis: syncope.

## 2017-09-25 NOTE — Telephone Encounter (Signed)
Spoke with patient and made him aware that company would be calling to verify his mailing address to ship out his monitor. He verbalized understanding with no further questions at this time.

## 2017-09-25 NOTE — Progress Notes (Signed)
Patient have been giving discharge instructions. Patient was thought how to take care of his leg bag and verbalized understanding. No complaints or SS of distress.

## 2017-09-25 NOTE — Telephone Encounter (Signed)
Done ° ° °Anthony Palmer °

## 2017-09-25 NOTE — Discharge Summary (Signed)
[place] [facility] [place] NAME: Anthony Palmer    MR#:  [id number]  DATE OF BIRTH:  03/27/1951  DATE OF ADMISSION:  09/21/2017   ADMITTING PHYSICIAN: Quintella Baton, MD  DATE OF DISCHARGE: 09/25/2017  1:50 PM  PRIMARY CARE PHYSICIAN: Maryland Pink, MD   ADMISSION DIAGNOSIS:  Syncope, unspecified syncope type [R55] DISCHARGE DIAGNOSIS:  Active Problems:   Syncope   Diabetes mellitus ([place])   CKD (chronic kidney disease), stage III (HCC)   Obstructive sleep apnea   Essential hypertension   Gout   ARF (acute renal failure) ([place])  SECONDARY DIAGNOSIS:   Past Medical History:  Diagnosis Date  . Arthritis   . Diabetes mellitus without complication ([place])   . Hyperlipidemia   . Hypertension   . Trigger finger, acquired    HOSPITAL COURSE:  66 y.o.caucasian  male with DM-2, HTN, OSA, CKD, was admitted on 9/29/2018with syncope and was found to have acute urinary retention and hydronephrosis.   1. Acute renal failure: Baseline Cr. Was 1.3.-> 2.49 -> 2.29-> 1.9 on d/c 2. Bilateral hydronephrosis from bladder outlet obstruction - being d/c with indwelling foley 3. Right upper pole complex renal cyst - outpt urology f/up 4. Gross hematuria: due to foley placement, resolved in 24 hrs 5.  Syncope - unclear etio, could be cardiac arrythmia - normal Echocardiogram, carotid duplex  - outpt Holter monitor per Dr. Rockey Situ and f/up with his office  DISCHARGE CONDITIONS:  stable CONSULTS OBTAINED:  Treatment Team:  Minna Merritts, MD Murlean Iba, MD Festus Aloe, MD DRUG ALLERGIES:  No Known Allergies DISCHARGE MEDICATIONS:   Allergies as of 09/25/2017   No Known Allergies     Medication List    STOP taking these medications   cefUROXime 250 MG tablet Commonly known as:  CEFTIN   losartan-hydrochlorothiazide 100-12.5 MG tablet Commonly known as:  HYZAAR     TAKE these medications   allopurinol 300 MG  tablet Commonly known as:  ZYLOPRIM Take 300 mg by mouth daily.   fluticasone 50 MCG/ACT nasal spray Commonly known as:  FLONASE Place 2 sprays into both nostrils daily.   glipiZIDE 2.5 MG 24 hr tablet Commonly known as:  GLUCOTROL XL Take 2.5 mg by mouth daily with breakfast.   lovastatin 40 MG tablet Commonly known as:  MEVACOR Take 40 mg by mouth at bedtime.   SYSTANE 0.4-0.3 % Gel ophthalmic gel Generic drug:  Polyethyl Glycol-Propyl Glycol Place 1 application into both eyes daily.   SYSTANE OP Apply 1 drop to eye daily.   tamsulosin 0.4 MG Caps capsule Commonly known as:  FLOMAX Take 0.4 mg by mouth daily.        DISCHARGE INSTRUCTIONS:   DIET:  Regular diet DISCHARGE CONDITION:  Good ACTIVITY:  Activity as tolerated OXYGEN:  Home Oxygen: No.  Oxygen Delivery: room air DISCHARGE LOCATION:  home   If you experience worsening of your admission symptoms, develop shortness of breath, life threatening emergency, suicidal or homicidal thoughts you must seek medical attention immediately by calling 911 or calling your MD immediately  if symptoms less severe.  You Must read complete instructions/literature along with all the possible adverse reactions/side effects for all the Medicines you take and that have been prescribed to you. Take any new Medicines after you have completely understood and accpet all the possible adverse reactions/side effects.   Please note  You were cared for by a hospitalist during your hospital stay. If you have  any questions about your discharge medications or the care you received while you were in the hospital after you are discharged, you can call the unit and asked to speak with the hospitalist on call if the hospitalist that took care of you is not available. Once you are discharged, your primary care physician will handle any further medical issues. Please note that NO REFILLS for any discharge medications will be authorized once you  are discharged, as it is imperative that you return to your primary care physician (or establish a relationship with a primary care physician if you do not have one) for your aftercare needs so that they can reassess your need for medications and monitor your lab values.    On the day of Discharge:  VITAL SIGNS:  Blood pressure 122/82, pulse 66, temperature 98.4 F (36.9 C), temperature source Oral, resp. rate 19, height 5\' 8"  (1.727 m), weight 102.9 kg (226 lb 12.8 oz), SpO2 100 %. PHYSICAL EXAMINATION:  GENERAL:  66 y.o.-year-old patient lying in the bed with no acute distress.  EYES: Pupils equal, round, reactive to light and accommodation. No scleral icterus. Extraocular muscles intact.  HEENT: Head atraumatic, normocephalic. Oropharynx and nasopharynx clear.  NECK:  Supple, no jugular venous distention. No thyroid enlargement, no tenderness.  LUNGS: Normal breath sounds bilaterally, no wheezing, rales,rhonchi or crepitation. No use of accessory muscles of respiration.  CARDIOVASCULAR: S1, S2 normal. No murmurs, rubs, or gallops.  ABDOMEN: Soft, non-tender, non-distended. Bowel sounds present. No organomegaly or mass.  EXTREMITIES: No pedal edema, cyanosis, or clubbing.  NEUROLOGIC: Cranial nerves II through XII are intact. Muscle strength 5/5 in all extremities. Sensation intact. Gait not checked.  PSYCHIATRIC: The patient is alert and oriented x 3.  SKIN: No obvious rash, lesion, or ulcer.  DATA REVIEW:   CBC  Recent Labs Lab 09/25/17 0426  WBC 7.1  HGB 13.0  HCT 38.4*  PLT 224    Chemistries   Recent Labs Lab 09/22/17 0406  09/25/17 0426  NA 140  < > 140  K 4.4  < > 4.6  CL 108  < > 112*  CO2 23  < > 23  GLUCOSE 94  < > 107*  BUN 46*  < > 33*  CREATININE 2.32*  < > 1.97*  CALCIUM 9.1  < > 9.2  MG 2.2  --   --   < > = values in this interval not displayed.   Follow-up Information    Schedule an appointment as soon as possible for a visit with Maryland Pink,  MD.   Specialty:  Family Medicine Contact information: [address] [place] [address] [phone]        Hollice Espy, MD. Schedule an appointment as soon as possible for a visit on 10/03/2017.   Specialty:  Urology Why:  at 845,  AND ULTRASOUND WILL BE ON 10/10, OFFICE WILL CALL YOU WITH  TIME Contact information: [address] [place] [address] [phone]        Minna Merritts, MD. Schedule an appointment as soon as possible for a visit on 11/05/2017.   Specialty:  Cardiology Why:  [facility] information: [place] [address] [phone]        Murlean Iba, MD. Go on 10/11/2017.   Specialty:  Internal Medicine Why:  as scheduled Contact information: [place] [address] [phone]  Management plans discussed with the patient, family and they are in agreement.  CODE STATUS: Prior   TOTAL TIME TAKING CARE OF THIS PATIENT: 45 minutes.    Max Sane M.D on 09/25/2017 at 8:27 PM  Between 7am to 6pm - Pager - [phone]  After 6pm go to [email or web address] - Proofreader  [facility]  Office  [phone]  CC: Primary care physician; Maryland Pink, MD   Note: This dictation was prepared with Dragon dictation along with smaller phrase technology. Any transcriptional errors that result from this process are unintentional.

## 2017-09-25 NOTE — Telephone Encounter (Signed)
Patient contacted regarding discharge from [facility] on 09/25/17.  Patient understands to follow up with provider Dr. Rockey Situ on 11/05/17 at 08:00 AM at [facility] Patient understands discharge instructions? Yes Patient understands medications and regiment? Yes Patient understands to bring all medications to this visit? Yes  Patient confirmed information and let him know that he is on a waiting list for a sooner appointment. He was appreciative for the call and had no further questions at this time.

## 2017-09-26 DIAGNOSIS — N1330 Unspecified hydronephrosis: Secondary | ICD-10-CM | POA: Diagnosis not present

## 2017-09-26 DIAGNOSIS — R944 Abnormal results of kidney function studies: Secondary | ICD-10-CM | POA: Diagnosis not present

## 2017-09-26 DIAGNOSIS — N179 Acute kidney failure, unspecified: Secondary | ICD-10-CM | POA: Diagnosis not present

## 2017-09-26 DIAGNOSIS — N281 Cyst of kidney, acquired: Secondary | ICD-10-CM | POA: Diagnosis not present

## 2017-09-26 DIAGNOSIS — R338 Other retention of urine: Secondary | ICD-10-CM | POA: Diagnosis not present

## 2017-09-26 NOTE — Progress Notes (Signed)
[place] [facility] [place] was admitted to the [facility] on 09/21/2017 and Discharged 09/25/2017 and should be excused from work  for 6 days starting 09/21/2017, may return to work without any restrictions 09/27/2017.  Max Sane M.D on 09/26/2017,at 3:34 PM  [place] at [facility]  [phone]

## 2017-09-29 ENCOUNTER — Observation Stay (INDEPENDENT_AMBULATORY_CARE_PROVIDER_SITE_OTHER): Payer: Medicare Other

## 2017-09-29 DIAGNOSIS — R55 Syncope and collapse: Secondary | ICD-10-CM

## 2017-10-02 ENCOUNTER — Ambulatory Visit
Admission: RE | Admit: 2017-10-02 | Discharge: 2017-10-02 | Disposition: A | Discharge status: Home / Self Care | Payer: Medicare Other | Source: Ambulatory Visit | Attending: Urology | Admitting: Urology

## 2017-10-02 DIAGNOSIS — N1330 Unspecified hydronephrosis: Secondary | ICD-10-CM | POA: Diagnosis not present

## 2017-10-02 DIAGNOSIS — N3289 Other specified disorders of bladder: Secondary | ICD-10-CM | POA: Insufficient documentation

## 2017-10-02 DIAGNOSIS — N1339 Other hydronephrosis: Secondary | ICD-10-CM

## 2017-10-03 ENCOUNTER — Ambulatory Visit (INDEPENDENT_AMBULATORY_CARE_PROVIDER_SITE_OTHER): Payer: 59

## 2017-10-03 VITALS — BP 115/73 | HR 73 | Ht 67.0 in | Wt 224.7 lb

## 2017-10-03 DIAGNOSIS — N1330 Unspecified hydronephrosis: Secondary | ICD-10-CM | POA: Diagnosis not present

## 2017-10-03 NOTE — Progress Notes (Signed)
Continuous Intermittent Catheterization  Due to possible rentention patient is present today for a teaching of self I & O Catheterization. Patient was given detailed verbal and printed instructions of self catheterization. Patient was cleaned and prepped in a sterile fashion.  With instruction and assistance patient inserted a 14FR coude and urine return was noted 20 ml, urine was yellow in color. Patient tolerated well, no complications were noted Patient was given a sample bag with supplies to take home.  Instructions were given per Dr. Erlene Quan for patient to cath PRN, daily.  Patient is to follow up tomorrow with Dr. Erlene Quan.  Preformed by: Toniann Fail, LPN   Additional Notes: Pt was taught CIC today and samples were given. No order was placed yet. A creatinine was drawn today. Pt will RTC tomorrow morning to see Dr. Erlene Quan. Pt voiced understanding of entire conversation/teachings.   Blood pressure 115/73, pulse 73, height 5\' 7"  (1.702 m), weight 224 lb 11.2 oz (101.9 kg).

## 2017-10-04 ENCOUNTER — Ambulatory Visit (INDEPENDENT_AMBULATORY_CARE_PROVIDER_SITE_OTHER): Payer: 59 | Admitting: Urology

## 2017-10-04 ENCOUNTER — Encounter: Payer: Self-pay | Admitting: Urology

## 2017-10-04 VITALS — BP 147/91 | HR 79 | Ht 67.0 in | Wt 223.0 lb

## 2017-10-04 DIAGNOSIS — N1330 Unspecified hydronephrosis: Secondary | ICD-10-CM

## 2017-10-04 DIAGNOSIS — R339 Retention of urine, unspecified: Secondary | ICD-10-CM | POA: Diagnosis not present

## 2017-10-04 DIAGNOSIS — N179 Acute kidney failure, unspecified: Secondary | ICD-10-CM | POA: Diagnosis not present

## 2017-10-04 DIAGNOSIS — N401 Enlarged prostate with lower urinary tract symptoms: Secondary | ICD-10-CM

## 2017-10-04 DIAGNOSIS — N138 Other obstructive and reflux uropathy: Secondary | ICD-10-CM

## 2017-10-04 LAB — CREATININE, SERUM
Creatinine, Ser: 1.69 mg/dL — ABNORMAL HIGH (ref 0.76–1.27)
GFR calc Af Amer: 49 mL/min/{1.73_m2} — ABNORMAL LOW (ref >59)
GFR calc non Af Amer: 42 mL/min/{1.73_m2} — ABNORMAL LOW (ref >59)

## 2017-10-04 MED ORDER — LIDOCAINE HCL 2 % EX GEL
1.0000 "application " | Freq: Once | CUTANEOUS | Status: AC
  Administered 2017-10-04: 1 via URETHRAL

## 2017-10-04 MED ORDER — FINASTERIDE 5 MG PO TABS: 5.0000 mg | ORAL_TABLET | Freq: Every day | ORAL | 11 refills | Status: DC

## 2017-10-04 MED ORDER — CIPROFLOXACIN HCL 500 MG PO TABS
500.0000 mg | ORAL_TABLET | Freq: Once | ORAL | Status: AC
  Administered 2017-10-04: 500 mg via ORAL

## 2017-10-04 NOTE — Progress Notes (Signed)
10/04/2017 9:20 PM   Anthony Palmer 1951-11-09 [id number]  Referring provider: Maryland Pink, MD [address] [place], [place] [address]    HPI: 66 year old male recently discharged from the hospital who presents today for follow-up.  He was recently admitted with an episode of syncope found to have acute kidney injury, urinary retention (1200 cc), and hydronephrosis. With Foley catheter placement, his creatinine continued to improve.  He returned to our office yesterday for a voiding trial. He was unable to void spontaneously and was instructed on clean intermittent catheterization.  Renal ultrasound on 10/02/2017 showed persistent bilateral hydronephrosis down to the level of the UVJ as seen on previous CT scan. A set of hydronephrosis improves slightly, her right-sided hydronephrosis is stable.  He does continue to have a severely thickened bladder wall diffusely.  CT scan estimated prostate volume around 60 cc.  He reports over the past few months to years, he said progressive urinary symptoms. He suspect these symptoms have progressed slowly. More recently, he feels like he had a weak urinary stream and likely not emptying completely.  This is accompanied by enuresis.  He is currently on Flomax.  His Cr continues to improve, Cr 1.69 down from 2.52 as inpatient.  Review of previous labs reveal a normal creatinine, 1.0 and 05/2016 with a slow progression of rising creatinine which nadired at most recent hospital admission.  Most recent PSA 1.46 on 06/2017.  PMH: Past Medical History:  Diagnosis Date  . Arthritis   . Diabetes mellitus without complication ([place])   . Hyperlipidemia   . Hypertension   . Trigger finger, acquired     Surgical History: Past Surgical History:  Procedure Laterality Date  . COLONOSCOPY WITH PROPOFOL N/A 07/23/2016   Procedure: COLONOSCOPY WITH PROPOFOL;  Surgeon: Lollie Sails, MD;  Location: [facility] ENDOSCOPY;  Service:  Endoscopy;  Laterality: N/A;    Home Medications:  Allergies as of 10/04/2017   No Known Allergies     Medication List       Accurate as of 10/04/17 11:59 PM. Always use your most recent med list.          allopurinol 300 MG tablet Commonly known as:  ZYLOPRIM Take 300 mg by mouth daily.   finasteride 5 MG tablet Commonly known as:  PROSCAR Take 1 tablet (5 mg total) by mouth daily.   glipiZIDE 2.5 MG 24 hr tablet Commonly known as:  GLUCOTROL XL Take 2.5 mg by mouth daily with breakfast.   lovastatin 40 MG tablet Commonly known as:  MEVACOR Take 40 mg by mouth at bedtime.   SYSTANE 0.4-0.3 % Gel ophthalmic gel Generic drug:  Polyethyl Glycol-Propyl Glycol Place 1 application into both eyes daily.   SYSTANE OP Apply 1 drop to eye daily.   tamsulosin 0.4 MG Caps capsule Commonly known as:  FLOMAX Take 0.4 mg by mouth daily.       Allergies: No Known Allergies  Family History: History reviewed. No pertinent family history.  Social History:  reports that he has never smoked. He has never used smokeless tobacco. He reports that he does not drink alcohol or use drugs.  ROS: UROLOGY Frequent Urination?: No Hard to postpone urination?: No Burning/pain with urination?: No Get up at night to urinate?: No Leakage of urine?: No Urine stream starts and stops?: No Trouble starting stream?: Yes Do you have to strain to urinate?: No Blood in urine?: No Urinary tract infection?: No Sexually transmitted disease?: No Injury  to kidneys or bladder?: No Painful intercourse?: No Weak stream?: No Erection problems?: No Penile pain?: No  Gastrointestinal Nausea?: No Vomiting?: No Indigestion/heartburn?: No Diarrhea?: No Constipation?: No  Constitutional Fever: No Night sweats?: No Weight loss?: No Fatigue?: No  Skin Skin rash/lesions?: No Itching?: No  Eyes Blurred vision?: No Double vision?: No  Ears/Nose/Throat Sore throat?: No Sinus problems?:  No  Hematologic/Lymphatic Swollen glands?: No Easy bruising?: No  Cardiovascular Leg swelling?: No Chest pain?: No  Respiratory Cough?: No Shortness of breath?: No  Endocrine Excessive thirst?: No  Musculoskeletal Back pain?: No Joint pain?: No  Neurological Headaches?: No Dizziness?: No  Psychologic Depression?: No Anxiety?: No  Physical Exam: BP (!) 147/91 (BP Location: Right Arm, Patient Position: Sitting, Cuff Size: Normal)   Pulse 79   Ht 5\' 7"  (1.702 m)   Wt 223 lb (101.2 kg)   BMI 34.93 kg/m   Constitutional:  Alert and oriented, No acute distress.  Accompanied by wife today. HEENT: [place] AT, moist mucus membranes.  Trachea midline, no masses. Cardiovascular: No clubbing, cyanosis, or edema. Respiratory: Normal respiratory effort, no increased work of breathing. GI: Abdomen is soft, nontender, nondistended, no abdominal masses.  Obese.  GU: No CVA tenderness. Circumcised phallus with orthotopic meatus. Rectal: Normal sphincter tone. 50 cc prostate, nontender, no nodules. Skin: No rashes, bruises or suspicious lesions. Neurologic: Grossly intact, no focal deficits, moving all 4 extremities. Psychiatric: Normal mood and affect.  Laboratory Data: Lab Results  Component Value Date   WBC 7.1 09/25/2017   HGB 13.0 09/25/2017   HCT 38.4 (L) 09/25/2017   MCV 92.3 09/25/2017   PLT 224 09/25/2017    Lab Results  Component Value Date   CREATININE 1.69 (H) 10/03/2017   Additional lab as above  Urinalysis Lab Results  Component Value Date   APPEARANCEUR CLEAR (A) 09/22/2017   LEUKOCYTESUR NEGATIVE 09/22/2017   PROTEINUR NEGATIVE 09/22/2017   GLUCOSEU NEGATIVE 09/22/2017   RBCU 0-5 09/22/2017   BILIRUBINUR NEGATIVE 09/22/2017   NITRITE NEGATIVE 09/22/2017    Lab Results  Component Value Date   BACTERIA NONE SEEN 09/22/2017    Pertinent Imaging: Results for orders placed during the hospital encounter of 10/02/17  US RENAL   Narrative CLINICAL  DATA:  66 year old male with bilateral hydronephrosis. No obstructing calculi, but bladder wall thickening noted on recent CT Abdomen and Pelvis.  EXAM: RENAL / URINARY TRACT ULTRASOUND COMPLETE  COMPARISON:  CT Abdomen and Pelvis 09/24/2017. Renal ultrasound 09/22/2017.  FINDINGS: Right Kidney:  Length: 12.0 cm. Persistent hydronephrosis and proximal hydroureter. The degree has not significantly changed since 09/22/2017. Stable renal cortical echogenicity and superimposed renal cysts.  Left Kidney:  Length: 11.8 cm. Persistent left hydronephrosis, although mildly improved since 09/22/2017. Stable left renal cortical echogenicity and small renal cysts.  Bladder:  Foley catheter visible within the urinary bladder. Diminutive bladder volume. Continued bladder wall thickening up to 17 mm (image 64).  IMPRESSION: 1. Persistent bilateral hydronephrosis. 2. Right side hydronephrosis and proximal hydroureter appears stable since the ultrasound on 09/22/2017. Left side hydronephrosis appears mildly improved. 3. Continued moderate to severe bladder wall thickening up to 17 mm. Foley catheter visible within the urinary bladder.   Electronically Signed   By: Genevie Ann M.D.   On: 10/02/2017 16:02    CT scan and renal ultrasound were reviewed personally today.  Calculated prostate volume ~60 cc.  Assessment & Plan:  66 year old male with likely chronic urinary retention secondary to bladder outlet obstruction, BPH status post  Foley catheter placement. Unfortunate, he has hydronephrosis has not resolved completely which is suggestive of bilateral UVJ obstruction, possibly secondary to thickened bladder wall in the setting of chronic obstruction.   I recommended further diagnostic workup in the operating room with bilateral retrograde pyelogram, possible bilateral ureteroscopy as deemed necessary with stent as needed.    Cysto without obvious malignancy today.   In addition to  above, he is currently in urinary retention and not voiding spontaneously.  He was offered an outlet procedure today in the form of TURP. Risks and benefits reviewed today in detail including risk of bleeding, infection, damage to 70 structures, retrograde ejaculation, and urinary incontinence amongst others. He understood all the risks and would like to proceed.   1. Hydronephrosis, unspecified hydronephrosis type RTG  As above with possible intervention - ciprofloxacin (CIPRO) tablet 500 mg; Take 1 tablet (500 mg total) by mouth once. - lidocaine (XYLOCAINE) 2 % jelly 1 application; Place 1 application into the urethra once. - finasteride (PROSCAR) 5 MG tablet; Take 1 tablet (5 mg total) by mouth daily.  Dispense: 30 tablet; Refill: 11  2. BPH with urinary obstruction CIC x 5 daily with coude tip catheter TURP as above Continue flomax and add finasteride  3. Chronic retention of urine  As above  4. Acute renal failure, unspecified acute renal failure type [facility]) Likely secondary to #1/2/3  Schedule surgery  Hollice Espy, MD  [place] [address] [place], [place] [address] [phone]  I spent 25 min with this patient of which greater than 50% was spent in counseling and coordination of care with the patient.

## 2017-10-07 NOTE — Progress Notes (Signed)
   10/07/17   HPI: See H&P  Blood pressure (!) 147/91, pulse 79, height 5\' 7"  (1.702 m), weight 223 lb (101.2 kg). NED. A&Ox3.   No respiratory distress   Abd soft, NT, ND Normal phallus with bilateral descended testicles  Cystoscopy Procedure Note  Patient identification was confirmed, informed consent was obtained, and patient was prepped using Betadine solution.  Lidocaine jelly was administered per urethral meatus.    Preoperative abx where received prior to procedure.     Pre-Procedure: - Inspection reveals a normal caliber ureteral meatus.  Procedure: The flexible cystoscope was introduced without difficulty - No urethral strictures/lesions are present. - Enlarged prostate with trilobar coaptation - mildly bladder neck - Bilateral ureteral orifices identified - Bladder mucosa  reveals no ulcers, tumors, or lesions.  Mild catheter cystitis noted.   - No bladder stones - Moderate/ severe trabeculation with saccules  Retroflexion shows mild intravesical protrusion without discrete median lobe   Post-Procedure: - Patient tolerated the procedure well

## 2017-10-08 ENCOUNTER — Telehealth: Payer: Self-pay

## 2017-10-08 NOTE — Telephone Encounter (Signed)
Left patient mess to call in regards to his surgery scheduled on 10-21-17 and pre op 10-15-17@10 :45. A clearance was faxed to Dr. Donivan Scull office in regards to patient's cardiac monitor waiting to hear back from their office left a message with Anderson Malta at their office as well.

## 2017-10-08 NOTE — Telephone Encounter (Signed)
Patient notified of surgery and pre-op dates and time

## 2017-10-14 ENCOUNTER — Telehealth: Payer: Self-pay | Admitting: Cardiovascular Disease

## 2017-10-14 ENCOUNTER — Telehealth: Payer: Self-pay

## 2017-10-14 NOTE — Telephone Encounter (Signed)
This will not interfere with surgery.  Suspect this may be a subcutaneous hematoma from his subcu heparin that he likely received as an inpatient.  Hollice Espy, MD

## 2017-10-14 NOTE — Telephone Encounter (Signed)
Pt called stating he has a quarter sized knot on his right side abd below the waist line. Pt described knot to be a bruise with redness around it and not painful. Pt denied n/v, f/c. Inquired about pt hitting the area or a bug bite. Pt denied both. Pt stated that he thought maybe it was done when cathing and did not want to complicate upcoming surgery. Reinforced with pt was pretty sure it was not from cathing and surgery would be fine, but would send a message to Dr. Erlene Quan just in case. Pt voiced understanding.

## 2017-10-14 NOTE — Telephone Encounter (Addendum)
Received request for cardiac clearance for upcoming TURP B retrogrades, possible URS w/ Stent placement scheduled with Dr. Hollice Espy on 10/21/17. Patient has not been seen by our office yet but is due to see [place] tomorrow with Ignacia Bayley NP. Will route request to him for review. Please route completed clearance over to # [phone] Attention Amy

## 2017-10-14 NOTE — Telephone Encounter (Signed)
Spoke with pt in reference to Dr. Cherrie Gauze notes. Pt voiced understanding.

## 2017-10-15 ENCOUNTER — Telehealth: Payer: Self-pay

## 2017-10-15 ENCOUNTER — Ambulatory Visit (INDEPENDENT_AMBULATORY_CARE_PROVIDER_SITE_OTHER): Payer: Medicare Other | Admitting: Nurse Practitioner

## 2017-10-15 ENCOUNTER — Other Ambulatory Visit
Admission: RE | Admit: 2017-10-15 | Discharge: 2017-10-15 | Disposition: A | Discharge status: Home / Self Care | Payer: Medicare Other | Source: Ambulatory Visit | Attending: Nurse Practitioner | Admitting: Nurse Practitioner

## 2017-10-15 ENCOUNTER — Inpatient Hospital Stay: Admission: RE | Admit: 2017-10-15 | Payer: 59 | Source: Ambulatory Visit

## 2017-10-15 ENCOUNTER — Encounter: Payer: Self-pay | Admitting: Nurse Practitioner

## 2017-10-15 VITALS — BP 124/85 | HR 64 | Ht 67.0 in | Wt 225.0 lb

## 2017-10-15 DIAGNOSIS — R55 Syncope and collapse: Secondary | ICD-10-CM | POA: Insufficient documentation

## 2017-10-15 DIAGNOSIS — E782 Mixed hyperlipidemia: Secondary | ICD-10-CM | POA: Insufficient documentation

## 2017-10-15 DIAGNOSIS — E1122 Type 2 diabetes mellitus with diabetic chronic kidney disease: Secondary | ICD-10-CM

## 2017-10-15 DIAGNOSIS — I453 Trifascicular block: Secondary | ICD-10-CM | POA: Diagnosis not present

## 2017-10-15 DIAGNOSIS — I10 Essential (primary) hypertension: Secondary | ICD-10-CM | POA: Insufficient documentation

## 2017-10-15 DIAGNOSIS — Z01812 Encounter for preprocedural laboratory examination: Secondary | ICD-10-CM | POA: Diagnosis not present

## 2017-10-15 DIAGNOSIS — N183 Chronic kidney disease, stage 3 unspecified: Secondary | ICD-10-CM

## 2017-10-15 LAB — CBC WITH DIFFERENTIAL/PLATELET
BASOS PCT: 1 %
Basophils Absolute: 0.1 10*3/uL (ref 0–0.1)
EOS ABS: 0.2 10*3/uL (ref 0–0.7)
EOS PCT: 2 %
HCT: 40.4 % (ref 40.0–52.0)
Hemoglobin: 13.4 g/dL (ref 13.0–18.0)
Lymphocytes Relative: 16 %
Lymphs Abs: 1.5 10*3/uL (ref 1.0–3.6)
MCH: 30.6 pg (ref 26.0–34.0)
MCHC: 33.2 g/dL (ref 32.0–36.0)
MCV: 92 fL (ref 80.0–100.0)
MONO ABS: 0.6 10*3/uL (ref 0.2–1.0)
MONOS PCT: 6 %
Neutro Abs: 7.1 10*3/uL — ABNORMAL HIGH (ref 1.4–6.5)
Neutrophils Relative %: 75 %
Platelets: 250 10*3/uL (ref 150–440)
RBC: 4.39 MIL/uL — ABNORMAL LOW (ref 4.40–5.90)
RDW: 15.2 % — AB (ref 11.5–14.5)
WBC: 9.4 10*3/uL (ref 3.8–10.6)

## 2017-10-15 LAB — BASIC METABOLIC PANEL
Anion gap: 9 (ref 5–15)
BUN: 22 mg/dL — ABNORMAL HIGH (ref 6–20)
CALCIUM: 9.5 mg/dL (ref 8.9–10.3)
CO2: 25 mmol/L (ref 22–32)
Chloride: 103 mmol/L (ref 101–111)
Creatinine, Ser: 1.56 mg/dL — ABNORMAL HIGH (ref 0.61–1.24)
GFR calc non Af Amer: 45 mL/min — ABNORMAL LOW (ref >60)
GFR, EST AFRICAN AMERICAN: 52 mL/min — AB (ref >60)
Glucose, Bld: 103 mg/dL — ABNORMAL HIGH (ref 65–99)
Potassium: 5.4 mmol/L — ABNORMAL HIGH (ref 3.5–5.1)
SODIUM: 137 mmol/L (ref 135–145)

## 2017-10-15 NOTE — Addendum Note (Signed)
Addended by: Dede Query R on: 10/15/2017 11:01 AM   Modules accepted: Orders

## 2017-10-15 NOTE — Patient Instructions (Addendum)
Medication Instructions:  Please continue your current medications  Labwork: BMET, CBC - please proceed to the [facility] for lab draw  Testing/Procedures: Your physician has recommended that you have a pacemaker inserted. A pacemaker is a small device that is placed under the skin of your chest or abdomen to help control abnormal heart rhythms. This device uses electrical pulses to prompt the heart to beat at a normal rate. Pacemakers are used to treat heart rhythms that are too slow. Wire (leads) are attached to the pacemaker that goes into the chambers of you heart. This is done in the hospital and usually requires and overnight stay.   You are scheduled for a PACEMAKER IMPLANT on 10/16/17 with Dr. Caryl Comes.  1. Please arrive at the [facility] (Main Entrance A) at [facility]: [address] [place], [place] [address] at 11:00 AM (two hours before your procedure to ensure your preparation). Free valet parking service is available.   Special note: Every effort is made to have your procedure done on time. Please understand that emergencies sometimes delay scheduled procedures.  2. Diet: Do not eat or drink anything after midnight prior to your procedure except sips of water to take medications.  3. Labs: TODAY (CBC, BMET)  4. Medication instructions in preparation for your procedure: You may take all of your morning medications, except:  DO NOT TAKE GLIPIZIDE  On the morning of your procedure, take your  and any morning medicines NOT listed above.  You may use sips of water.  5. Plan for one night stay--bring personal belongings.  6. Bring a current list of your medications and current insurance cards.  7. You MUST have a responsible person to drive you home.  8. Someone MUST be with you the first 24 hours after you arrive home or your discharge will be delayed.  9. Please wear clothes that are easy to get on and off and wear slip-on shoes.  Thank you for  allowing [place] to care for you!   -- [facility]  Follow-Up: Wound check 10-14 days after procedure With Dr. Caryl Comes in 3 months  If you need a refill on your cardiac medications before your next appointment, please call your pharmacy.   Pacemaker Implantation, Adult Pacemaker implantation is a procedure to place a pacemaker inside your chest. A pacemaker is a small computer that sends electrical signals to the heart and helps your heart beat normally. A pacemaker also stores information about your heart rhythms. You may need pacemaker implantation if you:  Have a slow heartbeat (bradycardia).  Faint (syncope).  Have shortness of breath (dyspnea) due to heart problems.  The pacemaker attaches to your heart through a wire, called a lead. Sometimes just one lead is needed. Other times, there will be two leads. There are two types of pacemakers:  Transvenous pacemaker. This type is placed under the skin or muscle of your chest. The lead goes through a vein in the chest area to reach the inside of the heart.  Epicardial pacemaker. This type is placed under the skin or muscle of your chest or belly. The lead goes through your chest to the outside of the heart.  Tell a health care provider about:  Any allergies you have.  All medicines you are taking, including vitamins, herbs, eye drops, creams, and over-the-counter medicines.  Any problems you or family members have had with anesthetic medicines.  Any blood or bone disorders you have.  Any surgeries you  have had.  Any medical conditions you have.  Whether you are pregnant or may be pregnant. What are the risks? Generally, this is a safe procedure. However, problems may occur, including:  Infection.  Bleeding.  Failure of the pacemaker or the lead.  Collapse of a lung or bleeding into a lung.  Blood clot inside a blood vessel with a lead.  Damage to the heart.  Infection inside the heart  (endocarditis).  Allergic reactions to medicines.  What happens before the procedure? Staying hydrated Follow instructions from your health care provider about hydration, which may include:  Up to 2 hours before the procedure - you may continue to drink clear liquids, such as water, clear fruit juice, black coffee, and plain tea.  Eating and drinking restrictions Follow instructions from your health care provider about eating and drinking, which may include:  8 hours before the procedure - stop eating heavy meals or foods such as meat, fried foods, or fatty foods.  6 hours before the procedure - stop eating light meals or foods, such as toast or cereal.  6 hours before the procedure - stop drinking milk or drinks that contain milk.  2 hours before the procedure - stop drinking clear liquids.  Medicines  Ask your health care provider about: ? Changing or stopping your regular medicines. This is especially important if you are taking diabetes medicines or blood thinners. ? Taking medicines such as aspirin and ibuprofen. These medicines can thin your blood. Do not take these medicines before your procedure if your health care provider instructs you not to.  You may be given antibiotic medicine to help prevent infection. General instructions  You will have a heart evaluation. This may include an electrocardiogram (ECG), chest X-ray, and heart imaging (echocardiogram,  or echo) tests.  You will have blood tests.  Do not use any products that contain nicotine or tobacco, such as cigarettes and e-cigarettes. If you need help quitting, ask your health care provider.  Plan to have someone take you home from the hospital or clinic.  If you will be going home right after the procedure, plan to have someone with you for 24 hours.  Ask your health care provider how your surgical site will be marked or identified. What happens during the procedure?  To reduce your risk of  infection: ? Your health care team will wash or sanitize their hands. ? Your skin will be washed with soap. ? Hair may be removed from the surgical area.  An IV tube will be inserted into one of your veins.  You will be given one or more of the following: ? A medicine to help you relax (sedative). ? A medicine to numb the area (local anesthetic). ? A medicine to make you fall asleep (general anesthetic).  If you are getting a transvenous pacemaker: ? An incision will be made in your upper chest. ? A pocket will be made for the pacemaker. It may be placed under the skin or between layers of muscle. ? The lead will be inserted into a blood vessel that returns to the heart. ? While X-rays are taken by an imaging machine (fluoroscopy), the lead will be advanced through the vein to the inside of your heart. ? The other end of the lead will be tunneled under the skin and attached to the pacemaker.  If you are getting an epicardial pacemaker: ? An incision will be made near your ribs or breastbone (sternum) for the lead. ? The  lead will be attached to the outside of your heart. ? Another incision will be made in your chest or upper belly to create a pocket for the pacemaker. ? The free end of the lead will be tunneled under the skin and attached to the pacemaker.  The transvenous or epicardial pacemaker will be tested. Imaging studies may be done to check the lead position.  The incisions will be closed with stitches (sutures), adhesive strips, or skin glue.  Bandages (dressing) will be placed over the incisions. The procedure may vary among health care providers and hospitals. What happens after the procedure?  Your blood pressure, heart rate, breathing rate, and blood oxygen level will be monitored until the medicines you were given have worn off.  You will be given antibiotics and pain medicine.  ECG and chest x-rays will be done.  You will wear a continuous type of ECG (Holter  monitor) to check your heart rhythm.  Your health care provider willprogram the pacemaker.  Do not drive for 24 hours if you received a sedative. This information is not intended to replace advice given to you by your health care provider. Make sure you discuss any questions you have with your health care provider. Document Released: 11/30/2002 Document Revised: 06/29/2016 Document Reviewed: 05/23/2016 Elsevier Interactive Patient Education  2018 [employer].  Pacemaker Implantation, Adult, Care After This sheet gives you information about how to care for yourself after your procedure. Your health care provider may also give you more specific instructions. If you have problems or questions, contact your health care provider. What can I expect after the procedure? After the procedure, it is common to have:  Mild pain.  Slight bruising.  Some swelling over the incision.  A slight bump over the skin where the device was placed. Sometimes, it is possible to feel the device under the skin. This is normal.  Follow these instructions at home: Medicines  Take over-the-counter and prescription medicines only as told by your health care provider.  If you were prescribed an antibiotic medicine, take it as told by your health care provider. Do not stop taking the antibiotic even if you start to feel better. Wound care  Do not remove the bandage on your chest until directed to do so by your health care provider.  After your bandage is removed, you may see pieces of tape called skin adhesive strips over the area where the cut was made (incision site). Let them fall off on their own.  Check the incision site every day to make sure it is not infected, bleeding, or starting to pull apart.  Do not use lotions or ointments near the incision site unless directed to do so.  Keep the incision area clean and dry for 2-3 days after the procedure or as directed by your health care provider. It takes  several weeks for the incision site to completely heal.  Do not take baths, swim, or use a hot tub for 7-10 days or as otherwise directed by your health care provider. Activity  Do not drive or use heavy machinery while taking prescription pain medicine.  Do not drive for 24 hours if you were given a medicine to help you relax (sedative).  Check with your health care provider before you start to drive or play sports.  Avoid sudden jerking, pulling, or chopping movements that pull your upper arm far away from your body. Avoid these movements for at least 6 weeks or as long as told by  your health care provider.  Do not lift your upper arm above your shoulders for at least 6 weeks or as long as told by your health care provider. This means no tennis, golf, or swimming.  You may go back to work when your health care provider says it is okay. Pacemaker care  You may be shown how to transfer data from your pacemaker through the phone to your health care provider.  Always let all health care providers know about your pacemaker before you have any medical procedures or tests.  Wear a medical ID bracelet or necklace stating that you have a pacemaker. Carry a pacemaker ID card with you at all times.  Your pacemaker battery will last for 5-15 years. Routine checks by your health care provider will let the health care provider know when the battery is starting to run down. The pacemaker will need to be replaced when the battery starts to run down.  Do not use amateur Chief of Staff. Other electrical devices are safe to use, including power tools, lawn mowers, and speakers. If you are unsure of whether something is safe to use, ask your health care provider.  When using your cell phone, hold it to the ear opposite the pacemaker. Do not leave your cell phone in a pocket over the pacemaker.  Avoid places or objects that have a strong electric or magnetic field,  including: ? Airport Herbalist. When at the airport, let officials know that you have a pacemaker. ? Power plants. ? Large electrical generators. ? Radiofrequency transmission towers, such as cell phone and radio towers. General instructions  Weigh yourself every day. If you suddenly gain weight, fluid may be building up in your body.  Keep all follow-up visits as told by your health care provider. This is important. Contact a health care provider if:  You gain weight suddenly.  Your legs or feet swell.  It feels like your heart is fluttering or skipping beats (heart palpitations).  You have chills or a fever.  You have more redness, swelling, or pain around your incisions.  You have more fluid or blood coming from your incisions.  Your incisions feel warm to the touch.  You have pus or a bad smell coming from your incisions. Get help right away if:  You have chest pain.  You have trouble breathing or are short of breath.  You become extremely tired.  You are light-headed or you faint. This information is not intended to replace advice given to you by your health care provider. Make sure you discuss any questions you have with your health care provider. Document Released: 06/29/2005 Document Revised: 09/21/2016 Document Reviewed: 09/21/2016 Elsevier Interactive Patient Education  [employer].

## 2017-10-15 NOTE — Telephone Encounter (Signed)
Pt walked in stating he went to cardiology to get clearance for TURPT and was told he is needing a pacemaker. Pt stated that he will have a pacemaker placed tomorrow and then will have a wound check/follow up 10/29/17. Pt stated that he was told he can not have TURPT until at least after 10/29/17. Made pt aware that someone from [facility] will be in touch with him within the next week or two to follow up on clearances and surgeries. Pt voiced understanding.

## 2017-10-15 NOTE — Progress Notes (Signed)
Cardiology Clinic Note   Patient Name: Anthony Palmer Date of Encounter: 10/15/2017  Primary Care Provider:  [place]land Pink, MD Primary Cardiologist:  Johnny Bridge, MD / S. Caryl Comes, MD   Patient Profile    66 year old male with a history of type 2 diabetes mellitus, stage III chronic kidney disease, hypertension, hyperlipidemia, bladder outlet obstruction with bilateral hydronephrosis, and recent bouts of syncope, who presents for follow-up.  Past Medical History    Past Medical History:  Diagnosis Date  . Arthritis   . Bilateral hydronephrosis    a. 08/2017 in setting of bladder outlet obstruction.  . Bladder outlet obstruction    a. 08/2017 noted in setting of rising creatinine-->d/c'd w/ foley and plan for outpt urology f/u.  [name] CKD (chronic kidney disease), stage III ([place])   . Diabetes mellitus without complication ([place])   . Hyperlipidemia   . Hypertension   . Syncope    a. 08/2017 Echo: EF 60-65%, no rwma, Gr1 DD, nl RV fxn; b. 08/2017 carotid u/s: unremarkable.  . Trifascicular block   . Trigger finger, acquired    Past Surgical History:  Procedure Laterality Date  . COLONOSCOPY WITH PROPOFOL N/A 07/23/2016   Procedure: COLONOSCOPY WITH PROPOFOL;  Surgeon: Lollie Sails, MD;  Location: [facility] ENDOSCOPY;  Service: Endoscopy;  Laterality: N/A;    Allergies  No Known Allergies  History of Present Illness    66 year old male with the above complex past medical history including hypertension, hyperlipidemia, type 2 diabetes mellitus, and progressive, stage III chronic kidney disease.he was recently admitted to [facility] in the setting of syncope that occurred suddenly while he was walking in a store. He describes suddenly feeling a sensation that he might pass out and within 2 seconds, he lost consciousness, fell backwards and hit his head.  EMS was called and he was taken to the emergency department. There, prior to being placed on the monitor, he had another spell which  lasted 1 or 2 seconds where he aparently lost consciousness. His initial reported blood pressure is in the 160s. His 12-lead ECG from that day shows sinus rhythm with trifascicular block. He was seen by our team and also reported prior episodes of presyncope on 2 occasions, one in early August, and one, one week prior to admission. During hospitalization, cardiac enzymes were negative. Creatinine was noted to be elevated above baseline and this appeared to be progressive. He was on a diuretic and ARB, and both were held. Echocardiogram showed normal LV function without any significant valvular abnormalities. Carotid ultrasound was unremarkable. Creatinine improved with hydration.  Renal ultrasound showed bilateral hydronephrosis.  CT of the abdomen/pelvis showed moderate bilateral hydroureteronephrosis with ureteral distention extending down to the UVJ bilaterally. Bilateral renal cysts were also noted. Patient was discharged home with a Foley catheter and also placed on a 30 day event monitor.  Since his discharge, he has had no recurrence of presyncope or syncope. To this point, monitoring his only showed a few isolated PVCs. He has been followed by urology and continues to require intermittent straight catheterization. Recent cystoscopy showed an enlarged prostate while follow-up renal ultrasound continues to show hydronephrosis. Plan is for TURP and also bilateral retrograde pyelogram and possible bilateral ureteroscopy. This is tentatively scheduled for next Monday.  Patient denies any prior history of chest pain, palpitations, dyspnea, PND, orthopnea, edema, or early satiety.  Home Medications    Prior to Admission medications   Medication Sig Start Date End Date Taking? Authorizing Provider  allopurinol (ZYLOPRIM) 300 MG tablet Take 300 mg by mouth daily.   Yes [provider]  finasteride (PROSCAR) 5 MG tablet Take 1 tablet (5 mg total) by mouth daily. 10/04/17  Yes Hollice Espy, MD    glipiZIDE (GLUCOTROL XL) 2.5 MG 24 hr tablet Take 2.5 mg by mouth daily with breakfast.   Yes [provider]  lovastatin (MEVACOR) 40 MG tablet Take 40 mg by mouth at bedtime.   Yes [provider]  Polyethyl Glycol-Propyl Glycol (SYSTANE) 0.4-0.3 % GEL ophthalmic gel Place 1 application into both eyes daily as needed (dry eyes).    Yes [provider]  tamsulosin (FLOMAX) 0.4 MG CAPS capsule Take 0.4 mg by mouth daily.   Yes [provider]  White Petrolatum-Mineral Oil (SYSTANE NIGHTTIME) OINT Place 1 application into the right eye at bedtime.   Yes [provider]    Family History    Family History  Problem Relation Age of Onset  . Heart disease Mother   . Heart attack Mother   . Heart disease Father   . Heart attack Father   . Stroke Father     Social History    Social History   Social History  . Marital status: Married    Spouse name: N/A  . Number of children: N/A  . Years of education: N/A   Occupational History  . Not on file.   Social History Main Topics  . Smoking status: Never Smoker  . Smokeless tobacco: Never Used  . Alcohol use No  . Drug use: No  . Sexual activity: Not on file   Other Topics Concern  . Not on file   Social History Narrative   Lives locally with wife. Works as Optometrist.  Able to push-mow yard without difficulty/limitations.     Review of Systems    General:  No chills, fever, night sweats or weight changes.  Cardiovascular:  No chest pain, dyspnea on exertion, edema, orthopnea, palpitations, paroxysmal nocturnal dyspnea. +++ syncope as outlined above. Dermatological: No rash, lesions/masses Respiratory: No cough, dyspnea Urologic: No hematuria, dysuria. +++ difficulty in initiating stream. Abdominal:   No nausea, vomiting, diarrhea, bright red blood per rectum, melena, or hematemesis Neurologic:  No visual changes, wkns, changes in mental status. All other systems reviewed and are  otherwise negative except as noted above.  Physical Exam    VS:  BP 124/85 (BP Location: Left Arm, Patient Position: Sitting, Cuff Size: Large)   Pulse 64   Ht 5\' 7"  (1.702 m)   Wt 225 lb (102.1 kg)   BMI 35.24 kg/m  , BMI Body mass index is 35.24 kg/m. GEN: Well nourished, well developed, in no acute distress.  HEENT: normal.  Neck: Supple, no JVD, carotid bruits, or masses. Cardiac: RRR, no murmurs, rubs, or gallops. No clubbing, cyanosis, edema.  Radials/DP/PT 2+ and equal bilaterally.  Respiratory:  Respirations regular and unlabored, clear to auscultation bilaterally. GI: Soft, nontender, nondistended, BS + x 4. MS: no deformity or atrophy. Skin: warm and dry, no rash. Neuro:  Strength and sensation are intact. Psych: Normal affect.  Accessory Clinical Findings    ECG today - regular sinus rhythm with PVC, 64, first-degree AV block, left axis deviation, left anterior fascicular block, IVCD.  ECG from September 29 shows sinus rhythm, 81, first AV block, left axis deviation, left anterior fascicular block, right bundle branch block (trifascicular block)  Assessment & Plan   1.  Syncope/trifascicular block:Patient was recently hospitalized at [facility]  regional in the setting of syncope with prior episodes of presyncope. ECG in the emergency department show trifascicular block. He had no significant pauses or arrhythmias on telemetry. Echocardiogram showed normal LV function. He was placed on a 30 day event monitor. He has not had any recurrent symptoms. ECG today shows an intraventricular conduction delay without right bundle branch block but with persistent left anterior fascicular block and first-degree AV block. I discussed his case with Dr. Caryl Comes today in the setting of trifascicular block and sudden otherwise unexplained syncope.  This represents a class IIa indication for permanent pacemaker placement. This was discussed at length with the patient today and patient was also  evaluated independently by Dr. Caryl Comes. We will check labs today and plan is for permanent pacemaker placement tomorrow at [facility].  2. Preoperative cardiovascular examination: Patient was also noted during recent hospitalization have bilateral hydronephrosis in the setting of chronic urinary retention, bladder outlet obstruction, and BPH. He has been evaluated and followed by urology with a plan for TURP and bilateral retrograde pyeogram and possible bilateral ureteroscopy, tentatively scheduled for next Monday.  Unfortunately, in the setting of trifascicular block and syncope with indication for pacing, we recommend delaying surgery until after pacemaker placement, which is scheduled for tomorrow.  We would plan to see him back in device clinic approximately 7-10 days after pacemaker placement, at which point provided he is stable, he would be able to undergo urologic procedures without further ischemic evaluation, especially considering good exercise tolerance and absence of symptoms of chest pain or dyspnea (DASI 7.99, RCRI periop risk of major cardiac event - 0.9%).  3.  Essential hypertension: Patient was recently taken off of losartan HCTZ in the setting of worsening renal function. Blood pressure is normal today and he says it has been similar at home.  4. Type 2 diabetes mellitus: This is followed closely by primary care and he remains on glipizide therapy.  5. Hyperlipidemia: he remains on statin therapy and is followed by primary care.  6. Bladder outlet obstruction/BPH: See #2. Being followed by urology. He remains on Flomax. Surgery pending but delayed secondary to #1.  7. Stage III chronic kidney disease: In the setting of #6.  8. Disposition: Plan for permanent pacemaker placement tomorrow with Dr. Caryl Comes at [facility]. He'll have follow up in device clinic in approximately 7-10 days afterward and then follow with Dr. Caryl Comes in approximately 3 months.  Murray Hodgkins, NP 10/15/2017,  10:21 AM

## 2017-10-16 ENCOUNTER — Ambulatory Visit (HOSPITAL_COMMUNITY)
Admission: RE | Admit: 2017-10-16 | Discharge: 2017-10-17 | Disposition: A | Discharge status: Home / Self Care | Payer: Medicare Other | Source: Ambulatory Visit | Attending: Internal Medicine | Admitting: Internal Medicine

## 2017-10-16 ENCOUNTER — Encounter (HOSPITAL_COMMUNITY)
Admission: RE | Disposition: A | Discharge status: Home / Self Care | Payer: Self-pay | Source: Ambulatory Visit | Attending: Internal Medicine

## 2017-10-16 ENCOUNTER — Encounter (HOSPITAL_COMMUNITY): Payer: Self-pay | Admitting: General Practice

## 2017-10-16 DIAGNOSIS — I129 Hypertensive chronic kidney disease with stage 1 through stage 4 chronic kidney disease, or unspecified chronic kidney disease: Secondary | ICD-10-CM | POA: Insufficient documentation

## 2017-10-16 DIAGNOSIS — Z01812 Encounter for preprocedural laboratory examination: Secondary | ICD-10-CM

## 2017-10-16 DIAGNOSIS — Z8249 Family history of ischemic heart disease and other diseases of the circulatory system: Secondary | ICD-10-CM | POA: Diagnosis not present

## 2017-10-16 DIAGNOSIS — R55 Syncope and collapse: Secondary | ICD-10-CM | POA: Diagnosis not present

## 2017-10-16 DIAGNOSIS — Z7984 Long term (current) use of oral hypoglycemic drugs: Secondary | ICD-10-CM | POA: Diagnosis not present

## 2017-10-16 DIAGNOSIS — N281 Cyst of kidney, acquired: Secondary | ICD-10-CM | POA: Insufficient documentation

## 2017-10-16 DIAGNOSIS — I452 Bifascicular block: Secondary | ICD-10-CM | POA: Diagnosis present

## 2017-10-16 DIAGNOSIS — N320 Bladder-neck obstruction: Secondary | ICD-10-CM | POA: Insufficient documentation

## 2017-10-16 DIAGNOSIS — N183 Chronic kidney disease, stage 3 (moderate): Secondary | ICD-10-CM | POA: Insufficient documentation

## 2017-10-16 DIAGNOSIS — N401 Enlarged prostate with lower urinary tract symptoms: Secondary | ICD-10-CM | POA: Insufficient documentation

## 2017-10-16 DIAGNOSIS — R338 Other retention of urine: Secondary | ICD-10-CM | POA: Insufficient documentation

## 2017-10-16 DIAGNOSIS — N1330 Unspecified hydronephrosis: Secondary | ICD-10-CM | POA: Insufficient documentation

## 2017-10-16 DIAGNOSIS — E785 Hyperlipidemia, unspecified: Secondary | ICD-10-CM | POA: Diagnosis not present

## 2017-10-16 DIAGNOSIS — Z950 Presence of cardiac pacemaker: Secondary | ICD-10-CM

## 2017-10-16 DIAGNOSIS — E1122 Type 2 diabetes mellitus with diabetic chronic kidney disease: Secondary | ICD-10-CM | POA: Insufficient documentation

## 2017-10-16 DIAGNOSIS — I459 Conduction disorder, unspecified: Secondary | ICD-10-CM | POA: Diagnosis not present

## 2017-10-16 DIAGNOSIS — M1990 Unspecified osteoarthritis, unspecified site: Secondary | ICD-10-CM | POA: Insufficient documentation

## 2017-10-16 DIAGNOSIS — Z959 Presence of cardiac and vascular implant and graft, unspecified: Secondary | ICD-10-CM

## 2017-10-16 HISTORY — DX: Dependence on other enabling machines and devices: Z99.89

## 2017-10-16 HISTORY — DX: Gout, unspecified: M10.9

## 2017-10-16 HISTORY — DX: Unspecified asthma, uncomplicated: J45.909

## 2017-10-16 HISTORY — DX: Type 2 diabetes mellitus without complications: E11.9

## 2017-10-16 HISTORY — DX: Presence of cardiac pacemaker: Z95.0

## 2017-10-16 HISTORY — DX: Other specified health status: Z78.9

## 2017-10-16 HISTORY — PX: PACEMAKER IMPLANT: EP1218

## 2017-10-16 HISTORY — DX: Unspecified malignant neoplasm of skin, unspecified: C44.90

## 2017-10-16 HISTORY — DX: Obstructive sleep apnea (adult) (pediatric): G47.33

## 2017-10-16 HISTORY — PX: INSERT / REPLACE / REMOVE PACEMAKER: SUR710

## 2017-10-16 LAB — SURGICAL PCR SCREEN
MRSA, PCR: NEGATIVE
STAPHYLOCOCCUS AUREUS: POSITIVE — AB

## 2017-10-16 LAB — GLUCOSE, CAPILLARY
GLUCOSE-CAPILLARY: 81 mg/dL (ref 65–99)
GLUCOSE-CAPILLARY: 91 mg/dL (ref 65–99)
Glucose-Capillary: 90 mg/dL (ref 65–99)
Glucose-Capillary: 88 mg/dL (ref 65–99)

## 2017-10-16 LAB — POTASSIUM: POTASSIUM: 4.5 mmol/L (ref 3.5–5.1)

## 2017-10-16 SURGERY — PACEMAKER IMPLANT

## 2017-10-16 SURGERY — PACEMAKER IMPLANT
Anesthesia: Choice

## 2017-10-16 MED ORDER — ONDANSETRON HCL 4 MG/2ML IJ SOLN: 4.0000 mg | Freq: Four times a day (QID) | INTRAMUSCULAR | Status: DC | PRN

## 2017-10-16 MED ORDER — ARTIFICIAL TEARS OPHTHALMIC OINT
1.0000 "application " | TOPICAL_OINTMENT | Freq: Every day | OPHTHALMIC | Status: DC
  Administered 2017-10-16: 1 via OPHTHALMIC
  Filled 2017-10-16: qty 3.5

## 2017-10-16 MED ORDER — GLIPIZIDE ER 2.5 MG PO TB24
2.5000 mg | ORAL_TABLET | Freq: Every day | ORAL | Status: DC
  Administered 2017-10-17: 2.5 mg via ORAL
  Filled 2017-10-16: qty 1

## 2017-10-16 MED ORDER — BUPIVACAINE HCL (PF) 0.25 % IJ SOLN
INTRAMUSCULAR | Status: DC | PRN
  Administered 2017-10-16: 35 mL

## 2017-10-16 MED ORDER — MUPIROCIN 2 % EX OINT
TOPICAL_OINTMENT | CUTANEOUS | Status: AC
  Administered 2017-10-16: 1 via TOPICAL
  Filled 2017-10-16: qty 22

## 2017-10-16 MED ORDER — HEPARIN (PORCINE) IN NACL 2-0.9 UNIT/ML-% IJ SOLN
INTRAMUSCULAR | Status: AC
  Filled 2017-10-16: qty 500

## 2017-10-16 MED ORDER — SODIUM CHLORIDE 0.9 % IV SOLN: INTRAVENOUS | Status: AC

## 2017-10-16 MED ORDER — MUPIROCIN 2 % EX OINT
1.0000 "application " | TOPICAL_OINTMENT | Freq: Once | CUTANEOUS | Status: AC
  Administered 2017-10-16: 1 via TOPICAL
  Filled 2017-10-16: qty 22

## 2017-10-16 MED ORDER — SODIUM CHLORIDE 0.9 % IV SOLN
INTRAVENOUS | Status: DC
  Administered 2017-10-16: 13:00:00 via INTRAVENOUS

## 2017-10-16 MED ORDER — CEFAZOLIN SODIUM-DEXTROSE 2-4 GM/100ML-% IV SOLN
2.0000 g | INTRAVENOUS | Status: AC
  Administered 2017-10-16: 2 g via INTRAVENOUS

## 2017-10-16 MED ORDER — SODIUM CHLORIDE 0.9 % IR SOLN
Status: AC
  Filled 2017-10-16: qty 2

## 2017-10-16 MED ORDER — ALLOPURINOL 300 MG PO TABS
300.0000 mg | ORAL_TABLET | Freq: Every day | ORAL | Status: DC
  Administered 2017-10-17: 09:00:00 300 mg via ORAL
  Filled 2017-10-16: qty 1

## 2017-10-16 MED ORDER — CHLORHEXIDINE GLUCONATE 4 % EX LIQD
60.0000 mL | Freq: Once | CUTANEOUS | Status: DC
  Filled 2017-10-16: qty 60

## 2017-10-16 MED ORDER — FENTANYL CITRATE (PF) 100 MCG/2ML IJ SOLN
INTRAMUSCULAR | Status: AC
  Filled 2017-10-16: qty 2

## 2017-10-16 MED ORDER — TAMSULOSIN HCL 0.4 MG PO CAPS
0.4000 mg | ORAL_CAPSULE | Freq: Every day | ORAL | Status: DC
  Administered 2017-10-17: 0.4 mg via ORAL
  Filled 2017-10-16: qty 1

## 2017-10-16 MED ORDER — YOU HAVE A PACEMAKER BOOK
Freq: Once | Status: AC
  Administered 2017-10-16: 21:00:00
  Filled 2017-10-16: qty 1

## 2017-10-16 MED ORDER — MIDAZOLAM HCL 5 MG/5ML IJ SOLN
INTRAMUSCULAR | Status: DC | PRN
  Administered 2017-10-16: 2 mg via INTRAVENOUS

## 2017-10-16 MED ORDER — ACETAMINOPHEN 325 MG PO TABS
325.0000 mg | ORAL_TABLET | ORAL | Status: DC | PRN
  Administered 2017-10-16: 20:00:00 650 mg via ORAL
  Filled 2017-10-16: qty 2

## 2017-10-16 MED ORDER — FENTANYL CITRATE (PF) 100 MCG/2ML IJ SOLN
INTRAMUSCULAR | Status: DC | PRN
  Administered 2017-10-16: 25 ug via INTRAVENOUS

## 2017-10-16 MED ORDER — POLYVINYL ALCOHOL 1.4 % OP SOLN
1.0000 [drp] | Freq: Every day | OPHTHALMIC | Status: DC | PRN
  Filled 2017-10-16: qty 15

## 2017-10-16 MED ORDER — CHLORHEXIDINE GLUCONATE CLOTH 2 % EX PADS
6.0000 | MEDICATED_PAD | Freq: Every day | CUTANEOUS | Status: DC
  Administered 2017-10-17: 09:00:00 6 via TOPICAL

## 2017-10-16 MED ORDER — SODIUM CHLORIDE 0.9 % IR SOLN
80.0000 mg | Status: AC
  Administered 2017-10-16: 80 mg

## 2017-10-16 MED ORDER — PRAVASTATIN SODIUM 10 MG PO TABS: 10.0000 mg | ORAL_TABLET | Freq: Every day | ORAL | Status: DC

## 2017-10-16 MED ORDER — MIDAZOLAM HCL 5 MG/5ML IJ SOLN
INTRAMUSCULAR | Status: AC
  Filled 2017-10-16: qty 5

## 2017-10-16 MED ORDER — BUPIVACAINE HCL (PF) 0.25 % IJ SOLN
INTRAMUSCULAR | Status: AC
  Filled 2017-10-16: qty 60

## 2017-10-16 MED ORDER — MUPIROCIN 2 % EX OINT
1.0000 "application " | TOPICAL_OINTMENT | Freq: Two times a day (BID) | CUTANEOUS | Status: DC
  Administered 2017-10-16 – 2017-10-17 (×2): 1 via NASAL
  Filled 2017-10-16: qty 22

## 2017-10-16 MED ORDER — HEPARIN (PORCINE) IN NACL 2-0.9 UNIT/ML-% IJ SOLN
INTRAMUSCULAR | Status: AC | PRN
  Administered 2017-10-16: 500 mL

## 2017-10-16 MED ORDER — FINASTERIDE 5 MG PO TABS
5.0000 mg | ORAL_TABLET | Freq: Every day | ORAL | Status: DC
  Administered 2017-10-17: 09:00:00 5 mg via ORAL
  Filled 2017-10-16: qty 1

## 2017-10-16 MED ORDER — CEFAZOLIN SODIUM-DEXTROSE 1-4 GM/50ML-% IV SOLN
1.0000 g | Freq: Four times a day (QID) | INTRAVENOUS | Status: AC
  Administered 2017-10-16 – 2017-10-17 (×3): 1 g via INTRAVENOUS
  Filled 2017-10-16 (×3): qty 50

## 2017-10-16 MED ORDER — CEFAZOLIN SODIUM-DEXTROSE 2-4 GM/100ML-% IV SOLN
INTRAVENOUS | Status: AC
  Filled 2017-10-16: qty 100

## 2017-10-16 SURGICAL SUPPLY — 12 items
CABLE SURGICAL S-101-97-12 (CABLE) ×2 IMPLANT
CATH RIGHTSITE C315HIS02 (CATHETERS) ×2 IMPLANT
IPG PACE AZUR XT DR MRI W1DR01 (Pacemaker) ×1 IMPLANT
LEAD CAPSURE NOVUS 5076-52CM (Lead) ×2 IMPLANT
LEAD SELECT SECURE 3830 383069 (Lead) ×1 IMPLANT
PACE AZURE XT DR MRI W1DR01 (Pacemaker) ×2 IMPLANT
PAD DEFIB LIFELINK (PAD) ×2 IMPLANT
SELECT SECURE 3830 383069 (Lead) ×2 IMPLANT
SHEATH CLASSIC 7F (SHEATH) ×4 IMPLANT
SLITTER 6232ADJ (MISCELLANEOUS) ×2 IMPLANT
TRAY PACEMAKER INSERTION (PACKS) ×2 IMPLANT
WIRE HI TORQ VERSACORE-J 145CM (WIRE) ×2 IMPLANT

## 2017-10-16 NOTE — Telephone Encounter (Signed)
Spoke with patient's wife and she would like to reschedule surgery until 11-11-17 as of now, all pending follow up after pacemaker placement today. Will follow up with Cardio on 11-6.

## 2017-10-16 NOTE — H&P (View-Only) (Signed)
Cardiology Clinic Note   Patient Name: Anthony Palmer Date of Encounter: 10/15/2017  Primary Care Provider:  Maryland Pink, MD Primary Cardiologist:  Johnny Bridge, MD / S. Caryl Comes, MD   Patient Profile    66 year old male with a history of type 2 diabetes mellitus, stage III chronic kidney disease, hypertension, hyperlipidemia, bladder outlet obstruction with bilateral hydronephrosis, and recent bouts of syncope, who presents for follow-up.  Past Medical History    Past Medical History:  Diagnosis Date  . Arthritis   . Bilateral hydronephrosis    a. 08/2017 in setting of bladder outlet obstruction.  . Bladder outlet obstruction    a. 08/2017 noted in setting of rising creatinine-->d/c'd w/ foley and plan for outpt urology f/u.  [name] CKD (chronic kidney disease), stage III ([place])   . Diabetes mellitus without complication ([place])   . Hyperlipidemia   . Hypertension   . Syncope    a. 08/2017 Echo: EF 60-65%, no rwma, Gr1 DD, nl RV fxn; b. 08/2017 carotid u/s: unremarkable.  . Trifascicular block   . Trigger finger, acquired    Past Surgical History:  Procedure Laterality Date  . COLONOSCOPY WITH PROPOFOL N/A 07/23/2016   Procedure: COLONOSCOPY WITH PROPOFOL;  Surgeon: Lollie Sails, MD;  Location: [facility] ENDOSCOPY;  Service: Endoscopy;  Laterality: N/A;    Allergies  No Known Allergies  History of Present Illness    66 year old male with the above complex past medical history including hypertension, hyperlipidemia, type 2 diabetes mellitus, and progressive, stage III chronic kidney disease.he was recently admitted to [facility] in the setting of syncope that occurred suddenly while he was walking in a store. He describes suddenly feeling a sensation that he might pass out and within 2 seconds, he lost consciousness, fell backwards and hit his head.  EMS was called and he was taken to the emergency department. There, prior to being placed on the monitor, he had another spell which  lasted 1 or 2 seconds where he aparently lost consciousness. His initial reported blood pressure is in the 160s. His 12-lead ECG from that day shows sinus rhythm with trifascicular block. He was seen by our team and also reported prior episodes of presyncope on 2 occasions, one in early August, and one, one week prior to admission. During hospitalization, cardiac enzymes were negative. Creatinine was noted to be elevated above baseline and this appeared to be progressive. He was on a diuretic and ARB, and both were held. Echocardiogram showed normal LV function without any significant valvular abnormalities. Carotid ultrasound was unremarkable. Creatinine improved with hydration.  Renal ultrasound showed bilateral hydronephrosis.  CT of the abdomen/pelvis showed moderate bilateral hydroureteronephrosis with ureteral distention extending down to the UVJ bilaterally. Bilateral renal cysts were also noted. Patient was discharged home with a Foley catheter and also placed on a 30 day event monitor.  Since his discharge, he has had no recurrence of presyncope or syncope. To this point, monitoring his only showed a few isolated PVCs. He has been followed by urology and continues to require intermittent straight catheterization. Recent cystoscopy showed an enlarged prostate while follow-up renal ultrasound continues to show hydronephrosis. Plan is for TURP and also bilateral retrograde pyelogram and possible bilateral ureteroscopy. This is tentatively scheduled for next Monday.  Patient denies any prior history of chest pain, palpitations, dyspnea, PND, orthopnea, edema, or early satiety.  Home Medications    Prior to Admission medications   Medication Sig Start Date End Date Taking? Authorizing Provider  allopurinol (ZYLOPRIM) 300 MG tablet Take 300 mg by mouth daily.   Yes [provider]  finasteride (PROSCAR) 5 MG tablet Take 1 tablet (5 mg total) by mouth daily. 10/04/17  Yes Hollice Espy, MD    glipiZIDE (GLUCOTROL XL) 2.5 MG 24 hr tablet Take 2.5 mg by mouth daily with breakfast.   Yes [provider]  lovastatin (MEVACOR) 40 MG tablet Take 40 mg by mouth at bedtime.   Yes [provider]  Polyethyl Glycol-Propyl Glycol (SYSTANE) 0.4-0.3 % GEL ophthalmic gel Place 1 application into both eyes daily as needed (dry eyes).    Yes [provider]  tamsulosin (FLOMAX) 0.4 MG CAPS capsule Take 0.4 mg by mouth daily.   Yes [provider]  White Petrolatum-Mineral Oil (SYSTANE NIGHTTIME) OINT Place 1 application into the right eye at bedtime.   Yes [provider]    Family History    Family History  Problem Relation Age of Onset  . Heart disease Mother   . Heart attack Mother   . Heart disease Father   . Heart attack Father   . Stroke Father     Social History    Social History   Social History  . Marital status: Married    Spouse name: N/A  . Number of children: N/A  . Years of education: N/A   Occupational History  . Not on file.   Social History Main Topics  . Smoking status: Never Smoker  . Smokeless tobacco: Never Used  . Alcohol use No  . Drug use: No  . Sexual activity: Not on file   Other Topics Concern  . Not on file   Social History Narrative   Lives locally with wife. Works as Optometrist.  Able to push-mow yard without difficulty/limitations.     Review of Systems    General:  No chills, fever, night sweats or weight changes.  Cardiovascular:  No chest pain, dyspnea on exertion, edema, orthopnea, palpitations, paroxysmal nocturnal dyspnea. +++ syncope as outlined above. Dermatological: No rash, lesions/masses Respiratory: No cough, dyspnea Urologic: No hematuria, dysuria. +++ difficulty in initiating stream. Abdominal:   No nausea, vomiting, diarrhea, bright red blood per rectum, melena, or hematemesis Neurologic:  No visual changes, wkns, changes in mental status. All other systems reviewed and are  otherwise negative except as noted above.  Physical Exam    VS:  BP 124/85 (BP Location: Left Arm, Patient Position: Sitting, Cuff Size: Large)   Pulse 64   Ht 5\' 7"  (1.702 m)   Wt 225 lb (102.1 kg)   BMI 35.24 kg/m  , BMI Body mass index is 35.24 kg/m. GEN: Well nourished, well developed, in no acute distress.  HEENT: normal.  Neck: Supple, no JVD, carotid bruits, or masses. Cardiac: RRR, no murmurs, rubs, or gallops. No clubbing, cyanosis, edema.  Radials/DP/PT 2+ and equal bilaterally.  Respiratory:  Respirations regular and unlabored, clear to auscultation bilaterally. GI: Soft, nontender, nondistended, BS + x 4. MS: no deformity or atrophy. Skin: warm and dry, no rash. Neuro:  Strength and sensation are intact. Psych: Normal affect.  Accessory Clinical Findings    ECG today - regular sinus rhythm with PVC, 64, first-degree AV block, left axis deviation, left anterior fascicular block, IVCD.  ECG from September 29 shows sinus rhythm, 81, first AV block, left axis deviation, left anterior fascicular block, right bundle branch block (trifascicular block)  Assessment & Plan   1.  Syncope/trifascicular block:Patient was recently hospitalized at [facility]  regional in the setting of syncope with prior episodes of presyncope. ECG in the emergency department show trifascicular block. He had no significant pauses or arrhythmias on telemetry. Echocardiogram showed normal LV function. He was placed on a 30 day event monitor. He has not had any recurrent symptoms. ECG today shows an intraventricular conduction delay without right bundle branch block but with persistent left anterior fascicular block and first-degree AV block. I discussed his case with Dr. Caryl Comes today in the setting of trifascicular block and sudden otherwise unexplained syncope.  This represents a class IIa indication for permanent pacemaker placement. This was discussed at length with the patient today and patient was also  evaluated independently by Dr. Caryl Comes. We will check labs today and plan is for permanent pacemaker placement tomorrow at [facility].  2. Preoperative cardiovascular examination: Patient was also noted during recent hospitalization have bilateral hydronephrosis in the setting of chronic urinary retention, bladder outlet obstruction, and BPH. He has been evaluated and followed by urology with a plan for TURP and bilateral retrograde pyeogram and possible bilateral ureteroscopy, tentatively scheduled for next Monday.  Unfortunately, in the setting of trifascicular block and syncope with indication for pacing, we recommend delaying surgery until after pacemaker placement, which is scheduled for tomorrow.  We would plan to see him back in device clinic approximately 7-10 days after pacemaker placement, at which point provided he is stable, he would be able to undergo urologic procedures without further ischemic evaluation, especially considering good exercise tolerance and absence of symptoms of chest pain or dyspnea (DASI 7.99, RCRI periop risk of major cardiac event - 0.9%).  3.  Essential hypertension: Patient was recently taken off of losartan HCTZ in the setting of worsening renal function. Blood pressure is normal today and he says it has been similar at home.  4. Type 2 diabetes mellitus: This is followed closely by primary care and he remains on glipizide therapy.  5. Hyperlipidemia: he remains on statin therapy and is followed by primary care.  6. Bladder outlet obstruction/BPH: See #2. Being followed by urology. He remains on Flomax. Surgery pending but delayed secondary to #1.  7. Stage III chronic kidney disease: In the setting of #6.  8. Disposition: Plan for permanent pacemaker placement tomorrow with Dr. Caryl Comes at [facility]. He'll have follow up in device clinic in approximately 7-10 days afterward and then follow with Dr. Caryl Comes in approximately 3 months.  Murray Hodgkins, NP 10/15/2017,  10:21 AM

## 2017-10-16 NOTE — Interval H&P Note (Signed)
History and Physical Interval Note:  10/16/2017 12:16 PM  Anthony Palmer  has presented today for surgery, with the diagnosis of bradicardia  The various methods of treatment have been discussed with the patient and family. After consideration of risks, benefits and other options for treatment, the patient has consented to  Procedure(s): PACEMAKER IMPLANT (N/A) as a surgical intervention .  The patient's history has been reviewed, patient examined, no change in status, stable for surgery.  I have reviewed the patient's chart and labs.  Questions were answered to the patient's satisfaction.     [name]

## 2017-10-16 NOTE — Care Management Note (Signed)
Case Management Note  Patient Details  Name: BENINO KORINEK MRN: [id number] Date of Birth: [birth year]  Subjective/Objective:      From home, s/p pace maker implant.               Action/Plan: NCM will follow for dc needs.   Expected Discharge Date:                  Expected Discharge Plan:  Home/Self Care  In-House Referral:     Discharge planning Services  CM Consult  Post Acute Care Choice:    Choice offered to:     DME Arranged:    DME Agency:     HH Arranged:    HH Agency:     Status of Service:  In process, will continue to follow  If discussed at Long Length of Stay Meetings, dates discussed:    Additional Comments:  Zenon Mayo, RN 10/16/2017, 5:08 PM

## 2017-10-17 ENCOUNTER — Encounter (HOSPITAL_COMMUNITY): Payer: Self-pay | Admitting: Internal Medicine

## 2017-10-17 ENCOUNTER — Ambulatory Visit (HOSPITAL_COMMUNITY): Payer: Medicare Other

## 2017-10-17 ENCOUNTER — Other Ambulatory Visit: Payer: Self-pay

## 2017-10-17 DIAGNOSIS — I459 Conduction disorder, unspecified: Secondary | ICD-10-CM | POA: Diagnosis not present

## 2017-10-17 DIAGNOSIS — R55 Syncope and collapse: Secondary | ICD-10-CM | POA: Diagnosis not present

## 2017-10-17 DIAGNOSIS — I452 Bifascicular block: Secondary | ICD-10-CM | POA: Diagnosis not present

## 2017-10-17 DIAGNOSIS — I129 Hypertensive chronic kidney disease with stage 1 through stage 4 chronic kidney disease, or unspecified chronic kidney disease: Secondary | ICD-10-CM | POA: Diagnosis not present

## 2017-10-17 DIAGNOSIS — E1122 Type 2 diabetes mellitus with diabetic chronic kidney disease: Secondary | ICD-10-CM | POA: Diagnosis not present

## 2017-10-17 LAB — GLUCOSE, CAPILLARY: GLUCOSE-CAPILLARY: 89 mg/dL (ref 65–99)

## 2017-10-17 NOTE — Discharge Summary (Signed)
ELECTROPHYSIOLOGY PROCEDURE DISCHARGE SUMMARY    Patient ID: KHRIZ LIDDY,  MRN: [id number], DOB/AGE: September 26, 1951 66 y.o.  Admit date: 10/16/2017 Discharge date: 10/17/2017  Primary Care Physician: Maryland Pink, MD  Primary Cardiologist: Dr. Rockey Situ Electrophysiologist: Dr. Caryl Comes  Primary Discharge Diagnosis:  1. Syncope w/ conduction system disease  Secondary Discharge Diagnosis:  1. HTN 2. DM 3. CRI (III) 4. Bladder outlet obstruction   No Known Allergies   Procedures This Admission:  1.  Implantation of a MDT dual chamber PPM on 10/16/17 by Dr Caryl Comes.  The patient received Medtronic MRI compatible 7829 ventricular lead serial [id number] and a Medtronic MRI compatible 5076 atrial lead serial number [id number] Medtronic MRI compatible pulse generator serial number [id number]. There were no immediate post procedure complications. 2.  CXR on 10/17/17 demonstrated no pneumothorax status post device implantation.   Brief HPI: Anthony Palmer is a 66 y.o. male was referred to electrophysiology in the outpatient setting after near syncope and a syncopal event with significant conduction system disease (trifasicular block), PPM implant was recommended.  Past medical history is noted above.  Risks, benefits, and alternatives to PPM implantation were reviewed with the patient who wished to proceed.   Hospital Course:  The patient was admitted and underwent implantation of a PPM with details as outlined above.  He was monitored on telemetry overnight which demonstrated SR 70's.  Left chest was without hematoma or ecchymosis.  The device was interrogated and found to be functioning normally.  CXR was obtained and demonstrated no pneumothorax status post device implantation.  Wound care, arm mobility, and restrictions were reviewed with the patient.  The patient was cleared to drive in a week per Dr. Caryl Comes. The patient was examined by Dr. Caryl Comes and considered stable for discharge  to home.   BP was somewhat elevated her, though out patient was much better, will follow without changes.   Physical Exam: Vitals:   10/16/17 2000 10/16/17 2217 10/17/17 0403 10/17/17 0700  BP:   (!) 165/98 (!) 158/86  Pulse: 83 81 64 73  Resp: 20 19 19    Temp:   98.4 F (36.9 C) 97.6 F (36.4 C)  TempSrc:   Oral Oral  SpO2: 98% 96% 99% 96%  Weight:   226 lb (102.5 kg)   Height:        GEN- The patient is well appearing, alert and oriented x 3 today.   HEENT: normocephalic, atraumatic; sclera clear, conjunctiva pink; hearing intact; oropharynx clear; neck supple, no JVP Lungs- CTA b/l, normal work of breathing.  No wheezes, rales, rhonchi Heart- RRR, no murmurs, rubs or gallops, PMI not laterally displaced GI- soft, non-tender, non-distended Extremities- no clubbing, cyanosis, or edema MS- no significant deformity or atrophy Skin- warm and dry, no rash or lesion, left chest without hematoma/ecchymosis Psych- euthymic mood, full affect Neuro- no gross deficits   Labs:   Lab Results  Component Value Date   WBC 9.4 10/15/2017   HGB 13.4 10/15/2017   HCT 40.4 10/15/2017   MCV 92.0 10/15/2017   PLT 250 10/15/2017     Recent Labs Lab 10/15/17 1129 10/16/17 1234  NA 137  --   K 5.4* 4.5  CL 103  --   CO2 25  --   BUN 22*  --   CREATININE 1.56*  --   CALCIUM 9.5  --   GLUCOSE 103*  --     Discharge Medications:  Allergies as of 10/17/2017   No Known  Allergies     Medication List    TAKE these medications   allopurinol 300 MG tablet Commonly known as:  ZYLOPRIM Take 300 mg by mouth daily.   finasteride 5 MG tablet Commonly known as:  PROSCAR Take 1 tablet (5 mg total) by mouth daily.   glipiZIDE 2.5 MG 24 hr tablet Commonly known as:  GLUCOTROL XL Take 2.5 mg by mouth daily with breakfast.   lovastatin 40 MG tablet Commonly known as:  MEVACOR Take 40 mg by mouth at bedtime.   SYSTANE 0.4-0.3 % Gel ophthalmic gel Generic drug:  Polyethyl  Glycol-Propyl Glycol Place 1 application into both eyes daily as needed (dry eyes).   SYSTANE NIGHTTIME Oint Place 1 application into the right eye at bedtime.   tamsulosin 0.4 MG Caps capsule Commonly known as:  FLOMAX Take 0.4 mg by mouth daily.       Disposition: Home Discharge Instructions    Diet - low sodium heart healthy    Complete by:  As directed    Increase activity slowly    Complete by:  As directed      Follow-up Information    [facility] Follow up on 10/29/2017.   Specialty:  Cardiology Why:  3:30PM, wound check visit Contact information: [address], [address] [place] [address]       Deboraha Sprang, MD Follow up on 01/21/2018.   Specialty:  Cardiology Why:  9:45AM Contact information: [place] [address] [phone]           Duration of Discharge Encounter: Greater than 30 minutes including physician time.  SignedTommye Standard, PA-C 10/17/2017 9:25 AM  Seen and examined and instructions given

## 2017-10-17 NOTE — Discharge Instructions (Signed)
° ° °  Supplemental Discharge Instructions for  Pacemaker/Defibrillator Patients  Activity No heavy lifting or vigorous activity with your left/right arm for 6 to 8 weeks.  Do not raise your left/right arm above your head for one week.  Gradually raise your affected arm as drawn below.             10/21/17                   10/22/17                  10/23/17                 10/24/17 __  NO DRIVING for 1 week  ; you may begin driving on 23/3/43 .  WOUND CARE - Keep the wound area clean and dry.  Do not get this area wet for 24 hours. No showers for 24 hours you may shower on  10/18/17  . - The tape/steri-strips on your wound will fall off; do not pull them off.  No bandage is needed on the site.  DO  NOT apply any creams, oils, or ointments to the wound area. - If you notice any drainage or discharge from the wound, any swelling or bruising at the site, or you develop a fever > 101? F after you are discharged home, call the office at once.  Special Instructions - You are still able to use cellular telephones; use the ear opposite the side where you have your pacemaker/defibrillator.  Avoid carrying your cellular phone near your device. - When traveling through airports, show security personnel your identification card to avoid being screened in the metal detectors.  Ask the security personnel to use the hand wand. - Avoid arc welding equipment, MRI testing (magnetic resonance imaging), TENS units (transcutaneous nerve stimulators).  Call the office for questions about other devices. - Avoid electrical appliances that are in poor condition or are not properly grounded. - Microwave ovens are safe to be near or to operate.  Additional information for defibrillator patients should your device go off: - If your device goes off ONCE and you feel fine afterward, notify the device clinic nurses. - If your device goes off ONCE and you do not feel well afterward, call 911. - If your device goes off  TWICE, call 911. - If your device goes off THREE times in one day, call 911.  DO NOT DRIVE YOURSELF OR A FAMILY MEMBER WITH A DEFIBRILLATOR TO THE HOSPITAL--CALL 911.

## 2017-10-18 ENCOUNTER — Telehealth: Payer: Self-pay | Admitting: *Deleted

## 2017-10-18 NOTE — Telephone Encounter (Signed)
Voicemail from Dannielle Huh from [employer] about patient. LMOM for him to call office back  So I can see what I could do to help him with.

## 2017-10-29 ENCOUNTER — Encounter: Payer: Self-pay | Admitting: Internal Medicine

## 2017-10-29 ENCOUNTER — Ambulatory Visit (INDEPENDENT_AMBULATORY_CARE_PROVIDER_SITE_OTHER): Payer: Medicare Other | Admitting: *Deleted

## 2017-10-29 DIAGNOSIS — I452 Bifascicular block: Secondary | ICD-10-CM | POA: Diagnosis not present

## 2017-10-29 DIAGNOSIS — R55 Syncope and collapse: Secondary | ICD-10-CM

## 2017-10-29 LAB — CUP PACEART INCLINIC DEVICE CHECK
Battery Remaining Longevity: 171 mo
Battery Voltage: 3.22 V
Brady Statistic AP VP Percent: 0.06 %
Brady Statistic AS VS Percent: 78.75 %
Brady Statistic RV Percent Paced: 0.12 %
Date Time Interrogation Session: 20181106162819
Implantable Lead Implant Date: 20181024
Implantable Lead Implant Date: 20181024
Implantable Lead Location: 753859
Implantable Pulse Generator Implant Date: 20181024
Lead Channel Impedance Value: 342 Ohm
Lead Channel Impedance Value: 456 Ohm
Lead Channel Impedance Value: 551 Ohm
Lead Channel Pacing Threshold Amplitude: 0.5 V
Lead Channel Pacing Threshold Amplitude: 0.75 V
Lead Channel Pacing Threshold Pulse Width: 0.4 ms
Lead Channel Sensing Intrinsic Amplitude: 3.875 mV
Lead Channel Setting Pacing Amplitude: 3.5 V
Lead Channel Setting Sensing Sensitivity: 0.9 mV
MDC IDC LEAD LOCATION: 753860
MDC IDC MSMT LEADCHNL RA IMPEDANCE VALUE: 342 Ohm
MDC IDC MSMT LEADCHNL RA SENSING INTR AMPL: 3.625 mV
MDC IDC MSMT LEADCHNL RV PACING THRESHOLD PULSEWIDTH: 0.4 ms
MDC IDC MSMT LEADCHNL RV SENSING INTR AMPL: 3.5 mV
MDC IDC MSMT LEADCHNL RV SENSING INTR AMPL: 3.875 mV
MDC IDC SET LEADCHNL RV PACING AMPLITUDE: 3.5 V
MDC IDC SET LEADCHNL RV PACING PULSEWIDTH: 1 ms
MDC IDC STAT BRADY AP VS PERCENT: 21.13 %
MDC IDC STAT BRADY AS VP PERCENT: 0.06 %
MDC IDC STAT BRADY RA PERCENT PACED: 21.43 %

## 2017-10-29 NOTE — Progress Notes (Signed)
Wound check appointment. Dermabond removed. Wound without redness or edema. Incision edges approximated, wound well healed. HIS bundle pacing appear septal on 12 lead from 5.0V until LOC at 0.25V @ 1.71ms. Normal device function. Thresholds, sensing, and impedances consistent with implant measurements. Device programmed at 3.5V programmed on for extra safety margin until 3 month visit. Histogram distribution appropriate for patient and level of activity. No mode switches or high ventricular rates noted. Patient educated about wound care, arm mobility, lifting restrictions. ROV 01/21/2017 w. SK

## 2017-11-01 ENCOUNTER — Telehealth: Payer: Self-pay | Admitting: Nurse Practitioner

## 2017-11-01 NOTE — Telephone Encounter (Signed)
Clearance routed through Epic to number provided.

## 2017-11-01 NOTE — Telephone Encounter (Signed)
Please send card clearance

## 2017-11-01 NOTE — Telephone Encounter (Signed)
   Chart reviewed as part of pre-operative protocol coverage. He is now s/p PPM in the setting of syncope and bifascicular block with intermittent trifascicular block. Device recently evaluated and functioning normally.  Previously identified that he has not prior h/o chest pain or dyspnea.  DASI 7.99, RCRI periop risk of major cardiac event - 0.9%.  Based on ACC/AHA guidelines, Anthony Palmer would be at acceptable risk for the planned procedure without further cardiovascular testing.   Murray Hodgkins, NP 11/01/2017, 3:18 PM

## 2017-11-04 ENCOUNTER — Telehealth: Payer: Self-pay | Admitting: Radiology

## 2017-11-04 NOTE — Telephone Encounter (Signed)
LMOM notifying pt of pre-admit testing appt on 11/05/17 @2 :00 for TURP with Dr Erlene Quan on 11/11/17.

## 2017-11-05 ENCOUNTER — Ambulatory Visit: Payer: 59 | Admitting: Cardiovascular Disease

## 2017-11-05 ENCOUNTER — Inpatient Hospital Stay: Admission: RE | Admit: 2017-11-05 | Payer: 59 | Source: Ambulatory Visit

## 2017-11-06 ENCOUNTER — Other Ambulatory Visit: Payer: Self-pay

## 2017-11-06 ENCOUNTER — Encounter
Admission: RE | Admit: 2017-11-06 | Discharge: 2017-11-06 | Disposition: A | Discharge status: Home / Self Care | Payer: Medicare Other | Source: Ambulatory Visit | Attending: Urology | Admitting: Urology

## 2017-11-06 DIAGNOSIS — E785 Hyperlipidemia, unspecified: Secondary | ICD-10-CM | POA: Diagnosis not present

## 2017-11-06 DIAGNOSIS — N183 Chronic kidney disease, stage 3 (moderate): Secondary | ICD-10-CM | POA: Diagnosis not present

## 2017-11-06 DIAGNOSIS — E1122 Type 2 diabetes mellitus with diabetic chronic kidney disease: Secondary | ICD-10-CM | POA: Insufficient documentation

## 2017-11-06 DIAGNOSIS — I129 Hypertensive chronic kidney disease with stage 1 through stage 4 chronic kidney disease, or unspecified chronic kidney disease: Secondary | ICD-10-CM | POA: Diagnosis not present

## 2017-11-06 DIAGNOSIS — Z01812 Encounter for preprocedural laboratory examination: Secondary | ICD-10-CM | POA: Insufficient documentation

## 2017-11-06 LAB — URINALYSIS, COMPLETE (UACMP) WITH MICROSCOPIC
Bacteria, UA: NONE SEEN
Bilirubin Urine: NEGATIVE
Glucose, UA: NEGATIVE mg/dL
Hgb urine dipstick: NEGATIVE
KETONES UR: NEGATIVE mg/dL
Leukocytes, UA: NEGATIVE
Nitrite: NEGATIVE
PH: 6 (ref 5.0–8.0)
Protein, ur: 30 mg/dL — AB
SPECIFIC GRAVITY, URINE: 1.012 (ref 1.005–1.030)
SQUAMOUS EPITHELIAL / LPF: NONE SEEN

## 2017-11-06 LAB — BASIC METABOLIC PANEL
Anion gap: 10 (ref 5–15)
BUN: 19 mg/dL (ref 6–20)
CO2: 23 mmol/L (ref 22–32)
CREATININE: 1.25 mg/dL — AB (ref 0.61–1.24)
Calcium: 9.5 mg/dL (ref 8.9–10.3)
Chloride: 107 mmol/L (ref 101–111)
GFR calc Af Amer: >60 mL/min (ref >60)
GFR, EST NON AFRICAN AMERICAN: 59 mL/min — AB (ref >60)
GLUCOSE: 95 mg/dL (ref 65–99)
Potassium: 4.7 mmol/L (ref 3.5–5.1)
SODIUM: 140 mmol/L (ref 135–145)

## 2017-11-06 LAB — DIFFERENTIAL
BASOS PCT: 0 %
Basophils Absolute: 0 10*3/uL (ref 0–0.1)
EOS ABS: 0.1 10*3/uL (ref 0–0.7)
EOS PCT: 2 %
LYMPHS ABS: 1.4 10*3/uL (ref 1.0–3.6)
LYMPHS PCT: 20 %
MONO ABS: 0.5 10*3/uL (ref 0.2–1.0)
MONOS PCT: 6 %
NEUTROS PCT: 72 %
Neutro Abs: 5.2 10*3/uL (ref 1.4–6.5)

## 2017-11-06 LAB — CBC
HCT: 45.2 % (ref 40.0–52.0)
HEMOGLOBIN: 14.6 g/dL (ref 13.0–18.0)
MCH: 29.6 pg (ref 26.0–34.0)
MCHC: 32.2 g/dL (ref 32.0–36.0)
MCV: 91.7 fL (ref 80.0–100.0)
Platelets: 247 10*3/uL (ref 150–440)
RBC: 4.93 MIL/uL (ref 4.40–5.90)
RDW: 14.7 % — ABNORMAL HIGH (ref 11.5–14.5)
WBC: 7.3 10*3/uL (ref 3.8–10.6)

## 2017-11-06 NOTE — Patient Instructions (Signed)
Your procedure is scheduled on: November 11, 2017 [facility] ) Report to Same Day Surgery. (MEDICAL [facility]ALL ) SECOND FLOOR To find out your arrival time please call [phone] between 1PM - 3PM on November 08, 2017.  Remember: Instructions that are not followed completely may result in serious medical risk, up to and including death, or upon the discretion of your surgeon and anesthesiologist your surgery may need to be rescheduled.     _X__ 1. Do not eat food after midnight the night before your procedure.                 No gum chewing or hard candies. You may drink clear liquids up to 2 hours                 before you are scheduled to arrive for your surgery- DO not drink clear                 liquids within 2 hours of the start of your surgery.                 Clear Liquids include:  water, apple juice without pulp, clear carbohydrate                 drink such as Clearfast of Gartorade, Black Coffee or Tea (Do not add                 anything to coffee or tea).     _X__ 2.  No Alcohol for 24 hours before or after surgery.   _X__ 3.  Do Not Smoke or use e-cigarettes For 24 Hours Prior to Your Surgery.                 Do not use any chewable tobacco products for at least 6 hours prior to                 surgery.  ____  4.  Bring all medications with you on the day of surgery if instructed.   _X___  5.  Notify your doctor if there is any change in your medical condition      (cold, fever, infections).     Do not wear jewelry, make-up, hairpins, clips or nail polish. Do not wear lotions, powders, or perfumes. You may wear deodorant. Do not shave 48 hours prior to surgery. Men may shave face and neck. Do not bring valuables to the hospital.    [facility] is not responsible for any belongings or valuables.  Contacts, dentures or bridgework may not be worn into surgery. Leave your suitcase in the car. After surgery it may be brought to your room. For patients  admitted to the hospital, discharge time is determined by your treatment team.   Patients discharged the day of surgery will not be allowed to drive home.   Please read over the following fact sheets that you were given:             ____ Take these medicines the morning of surgery with A SIP OF WATER:    1.   2.   3.   4.  5.  6.  ____ Fleet Enema (as directed)   ____ Use CHG Soap as directed  ____ Use inhalers on the day of surgery  ____ Stop metformin 2 days prior to surgery    ____ Take 1/2 of usual insulin dose the night before surgery. No insulin the morning  of surgery.   _X___ Stop Coumadin/Plavix/aspirin on (NO ASPIRIN PRODUCTS )  _X___ Stop Anti-inflammatories on (NO ASPIRIN PRODUCTS, ADVIL, ALEVE IBUPROFEN, EXCEDRIN, MOTRIN BC'S , GOODY'S POWDER ) TYLENOL OK TO TAKE FOR PAIN IF NEEDED )   ____ Stop supplements until after surgery.    _X___ Bring C-Pap to the hospital.

## 2017-11-06 NOTE — Pre-Procedure Instructions (Signed)
Cardiac Clearance received from Dr. Murray Hodgkins,  patient cleared  for surgery.

## 2017-11-07 NOTE — Addendum Note (Signed)
Addended by: Valora Corporal on: 11/07/2017 05:48 PM   Modules accepted: Orders

## 2017-11-08 LAB — URINE CULTURE: CULTURE: NO GROWTH

## 2017-11-10 MED ORDER — CEFAZOLIN SODIUM-DEXTROSE 2-4 GM/100ML-% IV SOLN
2.0000 g | Freq: Once | INTRAVENOUS | Status: AC
  Administered 2017-11-11: 2 g via INTRAVENOUS

## 2017-11-11 ENCOUNTER — Ambulatory Visit: Payer: Medicare Other | Admitting: Anesthesiology

## 2017-11-11 ENCOUNTER — Other Ambulatory Visit: Payer: Self-pay

## 2017-11-11 ENCOUNTER — Observation Stay
Admission: RE | Admit: 2017-11-11 | Discharge: 2017-11-12 | Disposition: A | Discharge status: Home / Self Care | Payer: Medicare Other | Source: Ambulatory Visit | Attending: Urology | Admitting: Urology

## 2017-11-11 ENCOUNTER — Encounter: Payer: Self-pay | Admitting: *Deleted

## 2017-11-11 ENCOUNTER — Encounter
Admission: RE | Disposition: A | Discharge status: Home / Self Care | Payer: Self-pay | Source: Ambulatory Visit | Attending: Urology

## 2017-11-11 DIAGNOSIS — R310 Gross hematuria: Secondary | ICD-10-CM | POA: Diagnosis not present

## 2017-11-11 DIAGNOSIS — E1122 Type 2 diabetes mellitus with diabetic chronic kidney disease: Secondary | ICD-10-CM | POA: Diagnosis not present

## 2017-11-11 DIAGNOSIS — N401 Enlarged prostate with lower urinary tract symptoms: Secondary | ICD-10-CM | POA: Diagnosis not present

## 2017-11-11 DIAGNOSIS — Z79899 Other long term (current) drug therapy: Secondary | ICD-10-CM | POA: Diagnosis not present

## 2017-11-11 DIAGNOSIS — N320 Bladder-neck obstruction: Secondary | ICD-10-CM | POA: Insufficient documentation

## 2017-11-11 DIAGNOSIS — R338 Other retention of urine: Secondary | ICD-10-CM | POA: Insufficient documentation

## 2017-11-11 DIAGNOSIS — I129 Hypertensive chronic kidney disease with stage 1 through stage 4 chronic kidney disease, or unspecified chronic kidney disease: Secondary | ICD-10-CM | POA: Diagnosis not present

## 2017-11-11 DIAGNOSIS — G4733 Obstructive sleep apnea (adult) (pediatric): Secondary | ICD-10-CM | POA: Insufficient documentation

## 2017-11-11 DIAGNOSIS — Z7984 Long term (current) use of oral hypoglycemic drugs: Secondary | ICD-10-CM | POA: Insufficient documentation

## 2017-11-11 DIAGNOSIS — N1330 Unspecified hydronephrosis: Secondary | ICD-10-CM | POA: Diagnosis not present

## 2017-11-11 DIAGNOSIS — N183 Chronic kidney disease, stage 3 (moderate): Secondary | ICD-10-CM | POA: Diagnosis not present

## 2017-11-11 DIAGNOSIS — N138 Other obstructive and reflux uropathy: Secondary | ICD-10-CM | POA: Diagnosis present

## 2017-11-11 DIAGNOSIS — M109 Gout, unspecified: Secondary | ICD-10-CM | POA: Diagnosis not present

## 2017-11-11 HISTORY — PX: TRANSURETHRAL RESECTION OF PROSTATE: SHX73

## 2017-11-11 HISTORY — PX: CYSTOSCOPY W/ RETROGRADES: SHX1426

## 2017-11-11 LAB — GLUCOSE, CAPILLARY
GLUCOSE-CAPILLARY: 88 mg/dL (ref 65–99)
GLUCOSE-CAPILLARY: 75 mg/dL (ref 65–99)
Glucose-Capillary: 121 mg/dL — ABNORMAL HIGH (ref 65–99)
Glucose-Capillary: 133 mg/dL — ABNORMAL HIGH (ref 65–99)

## 2017-11-11 SURGERY — TURP (TRANSURETHRAL RESECTION OF PROSTATE)
Anesthesia: General | Site: Ureter | Wound class: Clean Contaminated

## 2017-11-11 MED ORDER — SODIUM CHLORIDE 0.9 % IV SOLN
INTRAVENOUS | Status: DC
  Administered 2017-11-11: 07:00:00 via INTRAVENOUS

## 2017-11-11 MED ORDER — FAMOTIDINE 20 MG PO TABS
20.0000 mg | ORAL_TABLET | Freq: Once | ORAL | Status: AC
  Administered 2017-11-11: 20 mg via ORAL

## 2017-11-11 MED ORDER — BELLADONNA ALKALOIDS-OPIUM 16.2-60 MG RE SUPP
1.0000 | Freq: Four times a day (QID) | RECTAL | Status: DC | PRN
  Administered 2017-11-11: 1 via RECTAL
  Filled 2017-11-11: qty 1

## 2017-11-11 MED ORDER — SUGAMMADEX SODIUM 200 MG/2ML IV SOLN
INTRAVENOUS | Status: AC
  Filled 2017-11-11: qty 2

## 2017-11-11 MED ORDER — FINASTERIDE 5 MG PO TABS
5.0000 mg | ORAL_TABLET | Freq: Every day | ORAL | Status: DC
  Administered 2017-11-12: 5 mg via ORAL
  Filled 2017-11-11: qty 1

## 2017-11-11 MED ORDER — DOCUSATE SODIUM 100 MG PO CAPS
100.0000 mg | ORAL_CAPSULE | Freq: Two times a day (BID) | ORAL | Status: DC
  Administered 2017-11-11 – 2017-11-12 (×3): 100 mg via ORAL
  Filled 2017-11-11 (×3): qty 1

## 2017-11-11 MED ORDER — ONDANSETRON HCL 4 MG/2ML IJ SOLN
INTRAMUSCULAR | Status: AC
  Filled 2017-11-11: qty 2

## 2017-11-11 MED ORDER — DEXTROSE 5 % IV SOLN
1.0000 g | Freq: Three times a day (TID) | INTRAVENOUS | Status: AC
  Administered 2017-11-11 (×2): 1 g via INTRAVENOUS
  Filled 2017-11-11 (×2): qty 10

## 2017-11-11 MED ORDER — BELLADONNA ALKALOIDS-OPIUM 16.2-60 MG RE SUPP
RECTAL | Status: AC
  Administered 2017-11-11: 1 via RECTAL
  Filled 2017-11-11: qty 1

## 2017-11-11 MED ORDER — DIPHENHYDRAMINE HCL 50 MG/ML IJ SOLN: 12.5000 mg | Freq: Four times a day (QID) | INTRAMUSCULAR | Status: DC | PRN

## 2017-11-11 MED ORDER — ACETAMINOPHEN 325 MG PO TABS: 650.0000 mg | ORAL_TABLET | ORAL | Status: DC | PRN

## 2017-11-11 MED ORDER — ROCURONIUM BROMIDE 50 MG/5ML IV SOLN
INTRAVENOUS | Status: AC
  Filled 2017-11-11: qty 1

## 2017-11-11 MED ORDER — ARTIFICIAL TEARS OPHTHALMIC OINT
TOPICAL_OINTMENT | Freq: Every day | OPHTHALMIC | Status: DC | PRN
  Filled 2017-11-11: qty 3.5

## 2017-11-11 MED ORDER — ONDANSETRON HCL 4 MG/2ML IJ SOLN: 4.0000 mg | Freq: Once | INTRAMUSCULAR | Status: DC | PRN

## 2017-11-11 MED ORDER — SODIUM CHLORIDE 0.9 % IV SOLN
INTRAVENOUS | Status: DC
  Administered 2017-11-11: 14:00:00 via INTRAVENOUS

## 2017-11-11 MED ORDER — SUCCINYLCHOLINE CHLORIDE 20 MG/ML IJ SOLN
INTRAMUSCULAR | Status: DC | PRN
  Administered 2017-11-11: 100 mg via INTRAVENOUS

## 2017-11-11 MED ORDER — FENTANYL CITRATE (PF) 100 MCG/2ML IJ SOLN
INTRAMUSCULAR | Status: AC
  Filled 2017-11-11: qty 2

## 2017-11-11 MED ORDER — FENTANYL CITRATE (PF) 100 MCG/2ML IJ SOLN
INTRAMUSCULAR | Status: DC | PRN
  Administered 2017-11-11: 25 ug via INTRAVENOUS
  Administered 2017-11-11: 50 ug via INTRAVENOUS

## 2017-11-11 MED ORDER — ARTIFICIAL TEARS OPHTHALMIC OINT
TOPICAL_OINTMENT | Freq: Every day | OPHTHALMIC | Status: DC
  Administered 2017-11-11: 21:00:00 via OPHTHALMIC
  Filled 2017-11-11: qty 3.5

## 2017-11-11 MED ORDER — MIDAZOLAM HCL 2 MG/2ML IJ SOLN
INTRAMUSCULAR | Status: AC
  Filled 2017-11-11: qty 2

## 2017-11-11 MED ORDER — PRAVASTATIN SODIUM 20 MG PO TABS
40.0000 mg | ORAL_TABLET | Freq: Every day | ORAL | Status: DC
  Administered 2017-11-11: 40 mg via ORAL
  Filled 2017-11-11: qty 2

## 2017-11-11 MED ORDER — PROPOFOL 10 MG/ML IV BOLUS
INTRAVENOUS | Status: AC
  Filled 2017-11-11: qty 20

## 2017-11-11 MED ORDER — LIDOCAINE HCL (CARDIAC) 20 MG/ML IV SOLN
INTRAVENOUS | Status: DC | PRN
  Administered 2017-11-11: 60 mg via INTRAVENOUS

## 2017-11-11 MED ORDER — DIPHENHYDRAMINE HCL 12.5 MG/5ML PO ELIX
12.5000 mg | ORAL_SOLUTION | Freq: Four times a day (QID) | ORAL | Status: DC | PRN
  Filled 2017-11-11: qty 5

## 2017-11-11 MED ORDER — INSULIN ASPART 100 UNIT/ML ~~LOC~~ SOLN: 0.0000 [IU] | Freq: Every day | SUBCUTANEOUS | Status: DC

## 2017-11-11 MED ORDER — ALLOPURINOL 100 MG PO TABS
300.0000 mg | ORAL_TABLET | Freq: Every day | ORAL | Status: DC
  Administered 2017-11-12: 300 mg via ORAL
  Filled 2017-11-11: qty 3

## 2017-11-11 MED ORDER — HEPARIN SODIUM (PORCINE) 5000 UNIT/ML IJ SOLN
5000.0000 [IU] | Freq: Three times a day (TID) | INTRAMUSCULAR | Status: DC
  Administered 2017-11-11 – 2017-11-12 (×4): 5000 [IU] via SUBCUTANEOUS
  Filled 2017-11-11 (×4): qty 1

## 2017-11-11 MED ORDER — MIDAZOLAM HCL 2 MG/2ML IJ SOLN
INTRAMUSCULAR | Status: DC | PRN
  Administered 2017-11-11: 2 mg via INTRAVENOUS

## 2017-11-11 MED ORDER — SYSTANE NIGHTTIME OP OINT
1.0000 "application " | TOPICAL_OINTMENT | Freq: Every day | OPHTHALMIC | Status: DC
  Filled 2017-11-11: qty 3.5

## 2017-11-11 MED ORDER — CEFAZOLIN SODIUM-DEXTROSE 2-4 GM/100ML-% IV SOLN
INTRAVENOUS | Status: AC
  Filled 2017-11-11: qty 100

## 2017-11-11 MED ORDER — INSULIN ASPART 100 UNIT/ML ~~LOC~~ SOLN
4.0000 [IU] | Freq: Three times a day (TID) | SUBCUTANEOUS | Status: DC
  Administered 2017-11-12 (×2): 4 [IU] via SUBCUTANEOUS
  Filled 2017-11-11 (×2): qty 1

## 2017-11-11 MED ORDER — OXYCODONE-ACETAMINOPHEN 5-325 MG PO TABS: 1.0000 | ORAL_TABLET | ORAL | Status: DC | PRN

## 2017-11-11 MED ORDER — SUCCINYLCHOLINE CHLORIDE 20 MG/ML IJ SOLN
INTRAMUSCULAR | Status: AC
  Filled 2017-11-11: qty 1

## 2017-11-11 MED ORDER — POLYETHYL GLYCOL-PROPYL GLYCOL 0.4-0.3 % OP GEL
1.0000 "application " | Freq: Every day | OPHTHALMIC | Status: DC | PRN
  Filled 2017-11-11: qty 10

## 2017-11-11 MED ORDER — LIDOCAINE HCL (PF) 2 % IJ SOLN
INTRAMUSCULAR | Status: AC
  Filled 2017-11-11: qty 10

## 2017-11-11 MED ORDER — IOTHALAMATE MEGLUMINE 43 % IV SOLN
INTRAVENOUS | Status: DC | PRN
  Administered 2017-11-11: 25 mL via URETHRAL

## 2017-11-11 MED ORDER — PHENYLEPHRINE HCL 10 MG/ML IJ SOLN
INTRAMUSCULAR | Status: DC | PRN
  Administered 2017-11-11: 100 ug via INTRAVENOUS

## 2017-11-11 MED ORDER — MORPHINE SULFATE (PF) 2 MG/ML IV SOLN: 2.0000 mg | INTRAVENOUS | Status: DC | PRN

## 2017-11-11 MED ORDER — PROPOFOL 10 MG/ML IV BOLUS
INTRAVENOUS | Status: DC | PRN
  Administered 2017-11-11: 180 mg via INTRAVENOUS
  Administered 2017-11-11: 20 mg via INTRAVENOUS

## 2017-11-11 MED ORDER — ONDANSETRON HCL 4 MG/2ML IJ SOLN: 4.0000 mg | INTRAMUSCULAR | Status: DC | PRN

## 2017-11-11 MED ORDER — OXYBUTYNIN CHLORIDE 5 MG PO TABS: 5.0000 mg | ORAL_TABLET | Freq: Three times a day (TID) | ORAL | Status: DC | PRN

## 2017-11-11 MED ORDER — TAMSULOSIN HCL 0.4 MG PO CAPS
0.4000 mg | ORAL_CAPSULE | Freq: Every day | ORAL | Status: DC
  Administered 2017-11-12: 0.4 mg via ORAL
  Filled 2017-11-11: qty 1

## 2017-11-11 MED ORDER — ONDANSETRON HCL 4 MG/2ML IJ SOLN
INTRAMUSCULAR | Status: DC | PRN
Start: 2017-11-11 — End: 2017-11-11
  Administered 2017-11-11: 4 mg via INTRAVENOUS

## 2017-11-11 MED ORDER — FAMOTIDINE 20 MG PO TABS
ORAL_TABLET | ORAL | Status: AC
  Administered 2017-11-11: 20 mg
  Filled 2017-11-11: qty 1

## 2017-11-11 MED ORDER — SODIUM CHLORIDE 0.9 % IR SOLN
Status: DC | PRN
  Administered 2017-11-11: 9000 mL

## 2017-11-11 MED ORDER — INSULIN ASPART 100 UNIT/ML ~~LOC~~ SOLN: 0.0000 [IU] | Freq: Three times a day (TID) | SUBCUTANEOUS | Status: DC

## 2017-11-11 MED ORDER — FENTANYL CITRATE (PF) 100 MCG/2ML IJ SOLN: 25.0000 ug | INTRAMUSCULAR | Status: DC | PRN

## 2017-11-11 SURGICAL SUPPLY — 40 items
ADAPTER IRRIG TUBE 2 SPIKE SOL (ADAPTER) IMPLANT
BAG DRAIN CYSTO-URO LG1000N (MISCELLANEOUS) ×5 IMPLANT
BAG URO DRAIN 4000ML (MISCELLANEOUS) ×5 IMPLANT
BRUSH SCRUB EZ 1% IODOPHOR (MISCELLANEOUS) ×5 IMPLANT
CATH FOL 2WAY LX 24X30 (CATHETERS) IMPLANT
CATH FOL LEG HOLDER (MISCELLANEOUS) ×5 IMPLANT
CATH FOLEY 3WAY 30CC 22FR (CATHETERS) ×5 IMPLANT
CATH URETL 5X70 OPEN END (CATHETERS) ×5 IMPLANT
CNTNR SPEC 2.5X3XGRAD LEK (MISCELLANEOUS)
CONRAY 43 FOR UROLOGY 50M (MISCELLANEOUS) ×5 IMPLANT
CONT SPEC 4OZ STER OR WHT (MISCELLANEOUS)
CONTAINER SPEC 2.5X3XGRAD LEK (MISCELLANEOUS) IMPLANT
DRAPE UTILITY 15X26 TOWEL STRL (DRAPES) IMPLANT
ELECT LOOP 22F BIPOLAR SML (ELECTROSURGICAL)
ELECTRODE LOOP 22F BIPOLAR SML (ELECTROSURGICAL) IMPLANT
GLOVE BIO SURGEON STRL SZ 6.5 (GLOVE) ×5 IMPLANT
GOWN STRL REUS W/ TWL LRG LVL3 (GOWN DISPOSABLE) ×8 IMPLANT
GOWN STRL REUS W/TWL LRG LVL3 (GOWN DISPOSABLE) ×2
GUIDEWIRE GREEN .038 145CM (MISCELLANEOUS) IMPLANT
HOLDER FOLEY CATH W/STRAP (MISCELLANEOUS) ×5 IMPLANT
INFUSOR MANOMETER BAG 3000ML (MISCELLANEOUS) IMPLANT
INTRODUCER DILATOR DOUBLE (INTRODUCER) IMPLANT
KIT RM TURNOVER CYSTO AR (KITS) ×5 IMPLANT
LOOP CUT BIPOLAR 24F LRG (ELECTROSURGICAL) ×5 IMPLANT
PACK CYSTO AR (MISCELLANEOUS) ×5 IMPLANT
SCRUB POVIDONE IODINE 4 OZ (MISCELLANEOUS) ×5 IMPLANT
SENSORWIRE 0.038 NOT ANGLED (WIRE) ×5
SET CYSTO W/LG BORE CLAMP LF (SET/KITS/TRAYS/PACK) ×5 IMPLANT
SET IRRIG Y TYPE TUR BLADDER L (SET/KITS/TRAYS/PACK) ×5 IMPLANT
SET IRRIGATING DISP (SET/KITS/TRAYS/PACK) ×5 IMPLANT
SHEATH URETERAL 12FRX35CM (MISCELLANEOUS) IMPLANT
SOL .9 NS 3000ML IRR  AL (IV SOLUTION) ×4
SOL .9 NS 3000ML IRR UROMATIC (IV SOLUTION) ×16 IMPLANT
STENT URET 6FRX24 CONTOUR (STENTS) IMPLANT
STENT URET 6FRX26 CONTOUR (STENTS) IMPLANT
SURGILUBE 2OZ TUBE FLIPTOP (MISCELLANEOUS) ×5 IMPLANT
SYR TOOMEY 50ML (SYRINGE) IMPLANT
SYRINGE IRR TOOMEY STRL 70CC (SYRINGE) ×5 IMPLANT
WATER STERILE IRR 1000ML POUR (IV SOLUTION) IMPLANT
WIRE SENSOR 0.038 NOT ANGLED (WIRE) ×4 IMPLANT

## 2017-11-11 NOTE — Transfer of Care (Signed)
Immediate Anesthesia Transfer of Care Note  Patient: Anthony Palmer  Procedure(s) Performed: TRANSURETHRAL RESECTION OF THE PROSTATE (TURP) (N/A Prostate) CYSTOSCOPY WITH RETROGRADE PYELOGRAM (Bilateral Ureter)  Patient Location: PACU  Anesthesia Type:General  Level of Consciousness: drowsy  Airway & Oxygen Therapy: Patient Spontanous Breathing and Patient connected to nasal cannula oxygen  Post-op Assessment: Report given to RN and Post -op Vital signs reviewed and stable  Post vital signs: Reviewed and stable  Last Vitals:  Vitals:   11/11/17 0618  BP: (!) 151/95  Pulse: 62  Resp: 18  Temp: (!) 36.3 C  SpO2: 100%    Last Pain:  Vitals:   11/11/17 0618  TempSrc: Oral         Complications: No apparent anesthesia complications

## 2017-11-11 NOTE — Anesthesia Post-op Follow-up Note (Signed)
Anesthesia QCDR form completed.        

## 2017-11-11 NOTE — Anesthesia Procedure Notes (Addendum)
Procedure Name: Intubation Date/Time: 11/11/2017 7:57 AM Performed by: Eben Burow, CRNA Pre-anesthesia Checklist: Patient identified, Emergency Drugs available, Suction available, Patient being monitored and Timeout performed Patient Re-evaluated:Patient Re-evaluated prior to induction Oxygen Delivery Method: Circle system utilized Preoxygenation: Pre-oxygenation with 100% oxygen Induction Type: IV induction Laryngoscope Size: McGraph and 3 Grade View: Grade I Tube type: Oral Tube size: 8.0 mm Number of attempts: 1 Airway Equipment and Method: Stylet Placement Confirmation: positive ETCO2,  breath sounds checked- equal and bilateral and ETT inserted through vocal cords under direct vision Secured at: 23 cm Tube secured with: Tape Dental Injury: Teeth and Oropharynx as per pre-operative assessment

## 2017-11-11 NOTE — Anesthesia Preprocedure Evaluation (Signed)
Anesthesia Evaluation  Patient identified by MRN, date of birth, ID band Patient awake    Reviewed: Allergy & Precautions, H&P , NPO status , Patient's Chart, lab work & pertinent test results, reviewed documented beta blocker date and time   Airway Mallampati: II  TM Distance: >3 FB Neck ROM: full    Dental  (+) Teeth Intact   Pulmonary neg pulmonary ROS, asthma , sleep apnea and Continuous Positive Airway Pressure Ventilation ,    Pulmonary exam normal        Cardiovascular Exercise Tolerance: Good hypertension, negative cardio ROS Normal cardiovascular exam+ dysrhythmias + pacemaker  Rate:Normal     Neuro/Psych negative neurological ROS  negative psych ROS   GI/Hepatic negative GI ROS, Neg liver ROS,   Endo/Other  negative endocrine ROSdiabetes  Renal/GU Renal diseasenegative Renal ROS  negative genitourinary   Musculoskeletal   Abdominal   Peds  Hematology negative hematology ROS (+)   Anesthesia Other Findings   Reproductive/Obstetrics negative OB ROS                             Anesthesia Physical Anesthesia Plan  ASA: II  Anesthesia Plan: General LMA   Post-op Pain Management:    Induction:   PONV Risk Score and Plan:   Airway Management Planned:   Additional Equipment:   Intra-op Plan:   Post-operative Plan:   Informed Consent: I have reviewed the patients History and Physical, chart, labs and discussed the procedure including the risks, benefits and alternatives for the proposed anesthesia with the patient or authorized representative who has indicated his/her understanding and acceptance.     Plan Discussed with: CRNA  Anesthesia Plan Comments:         Anesthesia Quick Evaluation

## 2017-11-11 NOTE — H&P (Signed)
10/04/2017 --> updated 11/11/2017.  Now s/p cardiac pacemaker.  Otherwise nochange.  RRR/ CTAB.  Preop UCx negative.  Anthony Palmer Oct 04, 1951 [id number]  Referring provider: Maryland Pink, MD [address] [place], [place] [address]    HPI: 66 year old male recently discharged from the hospital who presents today for follow-up.  He was recently admitted with an episode of syncope found to have acute kidney injury, urinary retention (1200 cc), and hydronephrosis. With Foley catheter placement, his creatinine continued to improve.  He returned to our office yesterday for a voiding trial. He was unable to void spontaneously and was instructed on clean intermittent catheterization.  Renal ultrasound on 10/02/2017 showed persistent bilateral hydronephrosis down to the level of the UVJ as seen on previous CT scan. A set of hydronephrosis improves slightly, her right-sided hydronephrosis is stable.  He does continue to have a severely thickened bladder wall diffusely.  CT scan estimated prostate volume around 60 cc.  He reports over the past few months to years, he said progressive urinary symptoms. He suspect these symptoms have progressed slowly. More recently, he feels like he had a weak urinary stream and likely not emptying completely.  This is accompanied by enuresis.  He is currently on Flomax.  His Cr continues to improve, Cr 1.69 down from 2.52 as inpatient.  Review of previous labs reveal a normal creatinine, 1.0 and 05/2016 with a slow progression of rising creatinine which nadired at most recent hospital admission.  Most recent PSA 1.46 on 06/2017.  PMH:     Past Medical History:  Diagnosis Date  . Arthritis   . Diabetes mellitus without complication ([place])   . Hyperlipidemia   . Hypertension   . Trigger finger, acquired     Surgical History:      Past Surgical History:  Procedure Laterality Date  . COLONOSCOPY WITH PROPOFOL  N/A 07/23/2016   Procedure: COLONOSCOPY WITH PROPOFOL;  Surgeon: Lollie Sails, MD;  Location: [facility] ENDOSCOPY;  Service: Endoscopy;  Laterality: N/A;    Home Medications:  Allergies as of 10/04/2017   No Known Allergies            Medication List            Accurate as of 10/04/17 11:59 PM. Always use your most recent med list.           allopurinol 300 MG tablet Commonly known as:  ZYLOPRIM Take 300 mg by mouth daily.   finasteride 5 MG tablet Commonly known as:  PROSCAR Take 1 tablet (5 mg total) by mouth daily.   glipiZIDE 2.5 MG 24 hr tablet Commonly known as:  GLUCOTROL XL Take 2.5 mg by mouth daily with breakfast.   lovastatin 40 MG tablet Commonly known as:  MEVACOR Take 40 mg by mouth at bedtime.   SYSTANE 0.4-0.3 % Gel ophthalmic gel Generic drug:  Polyethyl Glycol-Propyl Glycol Place 1 application into both eyes daily.   SYSTANE OP Apply 1 drop to eye daily.   tamsulosin 0.4 MG Caps capsule Commonly known as:  FLOMAX Take 0.4 mg by mouth daily.       Allergies: No Known Allergies  Family History: History reviewed. No pertinent family history.  Social History:  reports that he has never smoked. He has never used smokeless tobacco. He reports that he does not drink alcohol or use drugs.  ROS: UROLOGY Frequent Urination?: No Hard to postpone urination?: No Burning/pain with urination?: No Get up at night to  urinate?: No Leakage of urine?: No Urine stream starts and stops?: No Trouble starting stream?: Yes Do you have to strain to urinate?: No Blood in urine?: No Urinary tract infection?: No Sexually transmitted disease?: No Injury to kidneys or bladder?: No Painful intercourse?: No Weak stream?: No Erection problems?: No Penile pain?: No  Gastrointestinal Nausea?: No Vomiting?: No Indigestion/heartburn?: No Diarrhea?: No Constipation?: No  Constitutional Fever: No Night sweats?: No Weight loss?:  No Fatigue?: No  Skin Skin rash/lesions?: No Itching?: No  Eyes Blurred vision?: No Double vision?: No  Ears/Nose/Throat Sore throat?: No Sinus problems?: No  Hematologic/Lymphatic Swollen glands?: No Easy bruising?: No  Cardiovascular Leg swelling?: No Chest pain?: No  Respiratory Cough?: No Shortness of breath?: No  Endocrine Excessive thirst?: No  Musculoskeletal Back pain?: No Joint pain?: No  Neurological Headaches?: No Dizziness?: No  Psychologic Depression?: No Anxiety?: No  Physical Exam: BP (!) 147/91 (BP Location: Right Arm, Patient Position: Sitting, Cuff Size: Normal)   Pulse 79   Ht 5\' 7"  (1.702 m)   Wt 223 lb (101.2 kg)   BMI 34.93 kg/m   Constitutional:  Alert and oriented, No acute distress.  Accompanied by wife today. HEENT: [place] AT, moist mucus membranes.  Trachea midline, no masses. Cardiovascular: No clubbing, cyanosis, or edema. Respiratory: Normal respiratory effort, no increased work of breathing. GI: Abdomen is soft, nontender, nondistended, no abdominal masses.  Obese.  GU: No CVA tenderness. Circumcised phallus with orthotopic meatus. Rectal: Normal sphincter tone. 50 cc prostate, nontender, no nodules. Skin: No rashes, bruises or suspicious lesions. Neurologic: Grossly intact, no focal deficits, moving all 4 extremities. Psychiatric: Normal mood and affect.  Laboratory Data: RecentLabs       Lab Results  Component Value Date   WBC 7.1 09/25/2017   HGB 13.0 09/25/2017   HCT 38.4 (L) 09/25/2017   MCV 92.3 09/25/2017   PLT 224 09/25/2017      RecentLabs       Lab Results  Component Value Date   CREATININE 1.69 (H) 10/03/2017     Additional lab as above  Urinalysis RecentLabs       Lab Results  Component Value Date   APPEARANCEUR CLEAR (A) 09/22/2017   LEUKOCYTESUR NEGATIVE 09/22/2017   PROTEINUR NEGATIVE 09/22/2017   GLUCOSEU NEGATIVE 09/22/2017   RBCU 0-5 09/22/2017    BILIRUBINUR NEGATIVE 09/22/2017   NITRITE NEGATIVE 09/22/2017      RecentLabs       Lab Results  Component Value Date   BACTERIA NONE SEEN 09/22/2017      Pertinent Imaging:     Results for orders placed during the hospital encounter of 10/02/17  US RENAL   Narrative CLINICAL DATA:  66 year old male with bilateral hydronephrosis. No obstructing calculi, but bladder wall thickening noted on recent CT Abdomen and Pelvis.  EXAM: RENAL / URINARY TRACT ULTRASOUND COMPLETE  COMPARISON:  CT Abdomen and Pelvis 09/24/2017. Renal ultrasound 09/22/2017.  FINDINGS: Right Kidney:  Length: 12.0 cm. Persistent hydronephrosis and proximal hydroureter. The degree has not significantly changed since 09/22/2017. Stable renal cortical echogenicity and superimposed renal cysts.  Left Kidney:  Length: 11.8 cm. Persistent left hydronephrosis, although mildly improved since 09/22/2017. Stable left renal cortical echogenicity and small renal cysts.  Bladder:  Foley catheter visible within the urinary bladder. Diminutive bladder volume. Continued bladder wall thickening up to 17 mm (image 64).  IMPRESSION: 1. Persistent bilateral hydronephrosis. 2. Right side hydronephrosis and proximal hydroureter appears stable since the ultrasound on 09/22/2017. Left side  hydronephrosis appears mildly improved. 3. Continued moderate to severe bladder wall thickening up to 17 mm. Foley catheter visible within the urinary bladder.   Electronically Signed   By: Genevie Ann M.D.   On: 10/02/2017 16:02    CT scan and renal ultrasound were reviewed personally today.  Calculated prostate volume ~60 cc.  Assessment & Plan:  66 year old male with likely chronic urinary retention secondary to bladder outlet obstruction, BPH status post Foley catheter placement. Unfortunate, he has hydronephrosis has not resolved completely which is suggestive of bilateral UVJ obstruction, possibly  secondary to thickened bladder wall in the setting of chronic obstruction.   I recommended further diagnostic workup in the operating room with bilateral retrograde pyelogram, possible bilateral ureteroscopy as deemed necessary with stent as needed.    Cysto without obvious malignancy today.   In addition to above, he is currently in urinary retention and not voiding spontaneously.  He was offered an outlet procedure today in the form of TURP. Risks and benefits reviewed today in detail including risk of bleeding, infection, damage to 70 structures, retrograde ejaculation, and urinary incontinence amongst others. He understood all the risks and would like to proceed.   1. Hydronephrosis, unspecified hydronephrosis type RTG  As above with possible intervention - ciprofloxacin (CIPRO) tablet 500 mg; Take 1 tablet (500 mg total) by mouth once. - lidocaine (XYLOCAINE) 2 % jelly 1 application; Place 1 application into the urethra once. - finasteride (PROSCAR) 5 MG tablet; Take 1 tablet (5 mg total) by mouth daily.  Dispense: 30 tablet; Refill: 11  2. BPH with urinary obstruction CIC x 5 daily with coude tip catheter TURP as above Continue flomax and add finasteride  3. Chronic retention of urine  As above  4. Acute renal failure, unspecified acute renal failure type [facility]) Likely secondary to #1/2/3  Schedule surgery  Hollice Espy, MD  [facility] [address] [place], [place] [address] [phone]  I spent 25 min with this patient of which greater than 50% was spent in counseling and coordination of care with the patient.

## 2017-11-11 NOTE — Anesthesia Postprocedure Evaluation (Signed)
Anesthesia Post Note  Patient: IZAYA NETHERTON  Procedure(s) Performed: TRANSURETHRAL RESECTION OF THE PROSTATE (TURP) (N/A Prostate) CYSTOSCOPY WITH RETROGRADE PYELOGRAM (Bilateral Ureter)  Patient location during evaluation: PACU Anesthesia Type: General Level of consciousness: awake and alert Pain management: pain level controlled Vital Signs Assessment: post-procedure vital signs reviewed and stable Respiratory status: spontaneous breathing, nonlabored ventilation, respiratory function stable and patient connected to nasal cannula oxygen Cardiovascular status: blood pressure returned to baseline and stable Postop Assessment: no apparent nausea or vomiting Anesthetic complications: no     Last Vitals:  Vitals:   11/11/17 0955 11/11/17 1011  BP: 139/87 140/90  Pulse: 67 64  Resp: (!) 25 16  Temp:    SpO2: 98% 97%    Last Pain:  Vitals:   11/11/17 1011  TempSrc:   PainSc: 0-No pain                 Molli Barrows

## 2017-11-12 ENCOUNTER — Encounter: Payer: Self-pay | Admitting: Urology

## 2017-11-12 DIAGNOSIS — N401 Enlarged prostate with lower urinary tract symptoms: Secondary | ICD-10-CM | POA: Diagnosis not present

## 2017-11-12 LAB — BASIC METABOLIC PANEL
Anion gap: 6 (ref 5–15)
BUN: 19 mg/dL (ref 6–20)
CHLORIDE: 109 mmol/L (ref 101–111)
CO2: 24 mmol/L (ref 22–32)
CREATININE: 1.55 mg/dL — AB (ref 0.61–1.24)
Calcium: 8.8 mg/dL — ABNORMAL LOW (ref 8.9–10.3)
GFR calc non Af Amer: 45 mL/min — ABNORMAL LOW (ref >60)
GFR, EST AFRICAN AMERICAN: 53 mL/min — AB (ref >60)
Glucose, Bld: 101 mg/dL — ABNORMAL HIGH (ref 65–99)
Potassium: 4.4 mmol/L (ref 3.5–5.1)
SODIUM: 139 mmol/L (ref 135–145)

## 2017-11-12 LAB — CBC
HCT: 37.7 % — ABNORMAL LOW (ref 40.0–52.0)
HEMOGLOBIN: 12.4 g/dL — AB (ref 13.0–18.0)
MCH: 30.2 pg (ref 26.0–34.0)
MCHC: 33.1 g/dL (ref 32.0–36.0)
MCV: 91.2 fL (ref 80.0–100.0)
Platelets: 215 10*3/uL (ref 150–440)
RBC: 4.13 MIL/uL — ABNORMAL LOW (ref 4.40–5.90)
RDW: 14.7 % — AB (ref 11.5–14.5)
WBC: 8.4 10*3/uL (ref 3.8–10.6)

## 2017-11-12 LAB — GLUCOSE, CAPILLARY
GLUCOSE-CAPILLARY: 124 mg/dL — AB (ref 65–99)
GLUCOSE-CAPILLARY: 80 mg/dL (ref 65–99)
Glucose-Capillary: 98 mg/dL (ref 65–99)
Glucose-Capillary: 96 mg/dL (ref 65–99)

## 2017-11-12 MED ORDER — DOCUSATE SODIUM 100 MG PO CAPS: 100.0000 mg | ORAL_CAPSULE | Freq: Two times a day (BID) | ORAL | 0 refills | Status: DC

## 2017-11-12 MED ORDER — HYDROCODONE-ACETAMINOPHEN 5-325 MG PO TABS: 1.0000 | ORAL_TABLET | Freq: Four times a day (QID) | ORAL | 0 refills | Status: DC | PRN

## 2017-11-12 NOTE — Discharge Summary (Signed)
Date of admission: 11/11/2017  Date of discharge: 11/12/2017  Admission diagnosis: BPH with bladder outlet obstruction, urinary retention, bilateral hydronephrosis  Discharge diagnosis: Same as above  Secondary diagnoses:  Patient Active Problem List   Diagnosis Date Noted  . BPH with urinary obstruction 11/11/2017  . Bifascicular block- RBBB LAFB 10/16/2017  . Syncope 09/21/2017  . Diabetes mellitus ([place]) 09/21/2017  . CKD (chronic kidney disease), stage III ([place]) 09/21/2017  . Obstructive sleep apnea 09/21/2017  . Essential hypertension 09/21/2017  . Gout 09/21/2017    History and Physical: For full details, please see admission history and physical. Briefly, Anthony Palmer is a 66 y.o. year old patient with a history of BPH and urinary retention with bilateral hydronephrosis who underwent TURP, bilateral retrograde pyelogram.  He was admitted overnight for observation continues bladder irrigation.   Hospital Course: Patient tolerated the procedure well.  He was then transferred to the floor after an uneventful PACU stay.  His hospital course was uncomplicated.  On POD#1 he had met discharge criteria: was eating a regular diet, was up and ambulating independently,  pain was well controlled, and was ready to for discharge.  Voiding trial was performed, he was able to void x2 but with significantly elevated PVR is greater than 500.  As such, an 47 French Foley catheter was replaced and he was discharged home with a catheter.  Physical Exam  Constitutional: He appears well-developed.  HENT:  Head: Normocephalic and atraumatic.  Cardiovascular: Normal rate.  Pulmonary/Chest: Effort normal. No respiratory distress.  Abdominal: Soft. He exhibits no distension. There is no tenderness.  Genitourinary: Penis normal.  Genitourinary Comments: Foley draining clear yellow urine  Skin: Skin is warm and dry.  Psychiatric: He has a normal mood and affect.  Vitals reviewed.    Laboratory  values:  Recent Labs    11/12/17 0506  WBC 8.4  HGB 12.4*  HCT 37.7*   Recent Labs    11/12/17 0506  NA 139  K 4.4  CL 109  CO2 24  GLUCOSE 101*  BUN 19  CREATININE 1.55*  CALCIUM 8.8*   No results for input(s): LABPT, INR in the last 72 hours. No results for input(s): LABURIN in the last 72 hours. Results for orders placed or performed during the hospital encounter of 11/06/17  Urine culture     Status: None   Collection Time: 11/06/17 11:29 AM  Result Value Ref Range Status   Specimen Description URINE, CLEAN CATCH  Final   Special Requests NONE  Final   Culture   Final    NO GROWTH Performed at [facility], [place] [address], [place], [place] [address]    Report Status 11/08/2017 FINAL  Final    Disposition: Home  Discharge instruction: Routine Foley catheter care.  Avoid heavy lifting.  Discharge medications:  Allergies as of 11/12/2017   No Known Allergies     Medication List    TAKE these medications   allopurinol 300 MG tablet Commonly known as:  ZYLOPRIM Take 300 mg by mouth daily.   docusate sodium 100 MG capsule Commonly known as:  COLACE Take 1 capsule (100 mg total) 2 (two) times daily by mouth.   finasteride 5 MG tablet Commonly known as:  PROSCAR Take 1 tablet (5 mg total) by mouth daily.   glipiZIDE 2.5 MG 24 hr tablet Commonly known as:  GLUCOTROL XL Take 2.5 mg by mouth daily with breakfast.   HYDROcodone-acetaminophen 5-325 MG tablet Commonly known as:  NORCO/VICODIN Take 1-2 tablets every 6 (six) hours as needed by mouth for moderate pain.   lovastatin 40 MG tablet Commonly known as:  MEVACOR Take 40 mg by mouth at bedtime.   SYSTANE 0.4-0.3 % Gel ophthalmic gel Generic drug:  Polyethyl Glycol-Propyl Glycol Place 1 application into both eyes daily as needed (dry eyes).   SYSTANE NIGHTTIME Oint Place 1 application into the right eye at bedtime.   tamsulosin 0.4 MG Caps capsule Commonly known as:  FLOMAX Take 0.4 mg  by mouth daily.       Followup:  Follow-up Information    Hollice Espy, MD. Go on 11/19/2017.   Specialty:  Urology Why:  Tuesday, 11/27 at 9 a.m. for voiding trial Contact information: [place]  [id number] [phone]

## 2017-11-12 NOTE — Op Note (Signed)
Date of procedure: 11/11/17  Preoperative diagnosis:  1. BPH with BOO 2. Bilateral hydronephrosis   Postoperative diagnosis:  1. same   Procedure: 1. TURP 2. Bilateral retrograde pyelogram  Surgeon: Hollice Espy, MD  Anesthesia: General  Complications: None  Intraoperative findings: Enlarged prostate with elevated bladder neck, intravesical protrusion of the prostate gland.  Retrograde pyelogram showed mild bilateral hydronephrosis without any obvious filling defects other than narrowing at the UVJ consistent with low-grade bilateral ureteral obstruction.  Drainage was seen from each of the kidneys.  EBL: Minimal  Specimens: Prostate chips  Drains: 22 French 3-way hematuria catheter on CBI  Indication: Anthony Palmer is a 66 y.o. patient with history of urinary retention, BPH with bladder outlet obstruction status post multiple failed voiding trials.  He also has persistent mild bilateral hydronephrosis despite Foley catheter management.  After reviewing the management options for treatment, he elected to proceed with the above surgical procedure(s). We have discussed the potential benefits and risks of the procedure, side effects of the proposed treatment, the likelihood of the patient achieving the goals of the procedure, and any potential problems that might occur during the procedure or recuperation. Informed consent has been obtained.  Description of procedure:  The patient was taken to the operating room and general anesthesia was induced.  The patient was placed in the dorsal lithotomy position, prepped and draped in the usual sterile fashion, and preoperative antibiotics were administered. A preoperative time-out was performed.   A 26 French resectoscope using a daily type obturator was advanced easily per urethra into the bladder.  At this point in time, a 30 degree lens with a laser bridge was brought in.  The bladder was carefully inspected.  It was noted to be  trabeculated with an elevated bladder neck.  The prostatic fossa had fairly significant trilobar coaptation with some edema at the bladder neck presumably related to recent Foley catheterization.  Attention was first turned to the left ureteral orifice which was cannulated using a 5 [place] open-ended ureteral catheter just within the UO.  A gentle retrograde pyelogram was performed on this side which revealed fullness in the upper tract collecting system consistent with mild hydronephrosis.  There is no evidence of filling defects within the collecting system and the ureter itself appeared relatively decompressed down to the level of the UVJ.  At this location, there was fairly significant narrowing to the level of the bladder concerning for a UVJ obstruction.  There was reflux of contrast seen from the left UO and sluggish yet complete drainage of the left kidney.  Attention was then turned to the right ureteral orifice at the same exact procedure were performed.  On this side, the findings were essentially the same.  There was mild hydronephrosis of the upper tract with a decompressed ureter and possible narrowing of the ureter at the UVJ.  This kidney drained as well with reflux from the orifice which was slightly more sluggish than anticipated.  At this point in time, I elected not to place bilateral ureteral stents as the kidneys drained, albeit somewhat sluggish with mild bilateral hydronephrosis.  Incidentally, his creatinine preoperatively had returned to near baseline which is reassuring.  At this point in time, the bipolar loop using saline as a medium was used to take down the median lobe component extending into the bladder.  All prostate tissue was removed down to the level of the bladder neck fibers.  I then made 2 incisions using the loop extending from  the bladder neck just proximal to the inferior.  This allowed for better irrigation flow throughout the prostate.  First, the posterior prostate  was taken down to the capsular fibers.  Care was taken to avoid any resection beyond the view.  Then, the left lobe sequentially followed by the right lobe was addressed.  The chips were pushed up into the bladder and were irrigated at the end of the procedure using a [place] syringe.  A small amount of apical/anterior tissue was also addressed/resected after the lateral lobes have been addressed as it appeared to be creating a somewhat of a ball-valve into the prostatic fossa.  At this point in time, a wide open trough was created and the bladder neck itself had been nicely leveled with a smooth slope into the bladder.  Careful hemostasis was then achieved.  No bladder chips remained.  At this point time, the scope was removed and a 22 [place] three-way Foley catheter was placed.  It was filled with 30 cc of sterile water.  Slow drip CBI was initiated.  The patient was then cleaned and dried, repositioned supine position, reversed from anesthesia, taken to PACU in stable condition.  Plan: Patient will be admitted for CBI overnight and observation.  If he is unable to void sufficiently, will discharge home with a Foley catheter in place for a week.  At that point in time, if he is unable to void he will return to clean intermittent catheterization.  He will continue his Flomax and finasteride for the time being.  We will follow-up and repeat a creatinine in 4 weeks.  We may consider a Lasix renogram to assess the degree of obstruction in the future as needed.  Hollice Espy, M.D.

## 2017-11-12 NOTE — Progress Notes (Addendum)
Pt's VS WDL he was receptive of his instructions. Given prescriptions and was sent home with his wife. IV catheter removed without incident. Inserted 18 Fr prior to discharge  Instructions given for 18 Fr foley care with instructions on leg bag measuring cups sent along with extra leg bags and he stated he understood.

## 2017-11-12 NOTE — Care Management Obs Status (Signed)
[facility] NOTIFICATION   Patient Details  Name: Anthony Palmer MRN: [id number] Date of Birth: 18-Jul-1951   Medicare Observation Status Notification Given:  Yes    Marshell Garfinkel, RN 11/12/2017, 12:55 PM

## 2017-11-12 NOTE — Progress Notes (Signed)
Continuous bladder irrigation stopped at 07:30. Passed on to on coming nurse.

## 2017-11-13 ENCOUNTER — Other Ambulatory Visit: Payer: Self-pay

## 2017-11-13 ENCOUNTER — Encounter: Payer: Self-pay | Admitting: Anesthesiology

## 2017-11-13 ENCOUNTER — Ambulatory Visit (INDEPENDENT_AMBULATORY_CARE_PROVIDER_SITE_OTHER): Payer: Medicare Other | Admitting: Urology

## 2017-11-13 ENCOUNTER — Inpatient Hospital Stay: Payer: Medicare Other | Admitting: Anesthesiology

## 2017-11-13 ENCOUNTER — Encounter: Payer: Self-pay | Admitting: Urology

## 2017-11-13 ENCOUNTER — Observation Stay
Admission: EM | Admit: 2017-11-13 | Discharge: 2017-11-14 | Disposition: A | Discharge status: Home / Self Care | Payer: Medicare Other | Source: Ambulatory Visit | Attending: Urology | Admitting: Urology

## 2017-11-13 ENCOUNTER — Encounter
Admission: EM | Disposition: A | Discharge status: Home / Self Care | Payer: Self-pay | Source: Ambulatory Visit | Attending: Urology

## 2017-11-13 VITALS — BP 167/123 | HR 103 | Ht 67.0 in | Wt 223.0 lb

## 2017-11-13 DIAGNOSIS — I129 Hypertensive chronic kidney disease with stage 1 through stage 4 chronic kidney disease, or unspecified chronic kidney disease: Secondary | ICD-10-CM | POA: Insufficient documentation

## 2017-11-13 DIAGNOSIS — R319 Hematuria, unspecified: Secondary | ICD-10-CM | POA: Diagnosis present

## 2017-11-13 DIAGNOSIS — Y838 Other surgical procedures as the cause of abnormal reaction of the patient, or of later complication, without mention of misadventure at the time of the procedure: Secondary | ICD-10-CM | POA: Insufficient documentation

## 2017-11-13 DIAGNOSIS — T8383XA Hemorrhage of genitourinary prosthetic devices, implants and grafts, initial encounter: Principal | ICD-10-CM | POA: Insufficient documentation

## 2017-11-13 DIAGNOSIS — N401 Enlarged prostate with lower urinary tract symptoms: Secondary | ICD-10-CM

## 2017-11-13 DIAGNOSIS — N183 Chronic kidney disease, stage 3 (moderate): Secondary | ICD-10-CM | POA: Diagnosis not present

## 2017-11-13 DIAGNOSIS — G4733 Obstructive sleep apnea (adult) (pediatric): Secondary | ICD-10-CM | POA: Diagnosis not present

## 2017-11-13 DIAGNOSIS — Z950 Presence of cardiac pacemaker: Secondary | ICD-10-CM | POA: Diagnosis not present

## 2017-11-13 DIAGNOSIS — Z7984 Long term (current) use of oral hypoglycemic drugs: Secondary | ICD-10-CM | POA: Insufficient documentation

## 2017-11-13 DIAGNOSIS — N138 Other obstructive and reflux uropathy: Secondary | ICD-10-CM | POA: Diagnosis not present

## 2017-11-13 DIAGNOSIS — Z85828 Personal history of other malignant neoplasm of skin: Secondary | ICD-10-CM | POA: Insufficient documentation

## 2017-11-13 DIAGNOSIS — R310 Gross hematuria: Secondary | ICD-10-CM | POA: Diagnosis not present

## 2017-11-13 DIAGNOSIS — E1122 Type 2 diabetes mellitus with diabetic chronic kidney disease: Secondary | ICD-10-CM | POA: Insufficient documentation

## 2017-11-13 DIAGNOSIS — T83098A Other mechanical complication of other indwelling urethral catheter, initial encounter: Secondary | ICD-10-CM | POA: Diagnosis not present

## 2017-11-13 DIAGNOSIS — N3289 Other specified disorders of bladder: Secondary | ICD-10-CM | POA: Diagnosis not present

## 2017-11-13 HISTORY — PX: CYSTOSCOPY WITH FULGERATION: SHX6638

## 2017-11-13 LAB — BASIC METABOLIC PANEL
Anion gap: 8 (ref 5–15)
BUN: 24 mg/dL — AB (ref 6–20)
CHLORIDE: 105 mmol/L (ref 101–111)
CO2: 23 mmol/L (ref 22–32)
CREATININE: 1.72 mg/dL — AB (ref 0.61–1.24)
Calcium: 8.6 mg/dL — ABNORMAL LOW (ref 8.9–10.3)
GFR calc Af Amer: 46 mL/min — ABNORMAL LOW (ref >60)
GFR calc non Af Amer: 40 mL/min — ABNORMAL LOW (ref >60)
Glucose, Bld: 186 mg/dL — ABNORMAL HIGH (ref 65–99)
POTASSIUM: 4.4 mmol/L (ref 3.5–5.1)
Sodium: 136 mmol/L (ref 135–145)

## 2017-11-13 LAB — CBC
HEMATOCRIT: 43.8 % (ref 40.0–52.0)
HEMATOCRIT: 39.6 % — AB (ref 40.0–52.0)
HEMATOCRIT: 39.4 % — AB (ref 40.0–52.0)
HEMOGLOBIN: 14 g/dL (ref 13.0–18.0)
HEMOGLOBIN: 12.9 g/dL — AB (ref 13.0–18.0)
Hemoglobin: 12.6 g/dL — ABNORMAL LOW (ref 13.0–18.0)
MCH: 29.5 pg (ref 26.0–34.0)
MCH: 29.8 pg (ref 26.0–34.0)
MCH: 29.6 pg (ref 26.0–34.0)
MCHC: 32 g/dL (ref 32.0–36.0)
MCHC: 32.6 g/dL (ref 32.0–36.0)
MCHC: 32 g/dL (ref 32.0–36.0)
MCV: 92.2 fL (ref 80.0–100.0)
MCV: 91.6 fL (ref 80.0–100.0)
MCV: 92.4 fL (ref 80.0–100.0)
PLATELETS: 203 10*3/uL (ref 150–440)
Platelets: 267 10*3/uL (ref 150–440)
Platelets: 214 10*3/uL (ref 150–440)
RBC: 4.75 MIL/uL (ref 4.40–5.90)
RBC: 4.33 MIL/uL — AB (ref 4.40–5.90)
RBC: 4.27 MIL/uL — AB (ref 4.40–5.90)
RDW: 14.9 % — ABNORMAL HIGH (ref 11.5–14.5)
RDW: 14.7 % — ABNORMAL HIGH (ref 11.5–14.5)
RDW: 14.7 % — AB (ref 11.5–14.5)
WBC: 19.4 10*3/uL — AB (ref 3.8–10.6)
WBC: 17.2 10*3/uL — ABNORMAL HIGH (ref 3.8–10.6)
WBC: 20.5 10*3/uL — ABNORMAL HIGH (ref 3.8–10.6)

## 2017-11-13 LAB — SURGICAL PATHOLOGY

## 2017-11-13 LAB — GLUCOSE, CAPILLARY
GLUCOSE-CAPILLARY: 177 mg/dL — AB (ref 65–99)
Glucose-Capillary: 196 mg/dL — ABNORMAL HIGH (ref 65–99)
Glucose-Capillary: 185 mg/dL — ABNORMAL HIGH (ref 65–99)
Glucose-Capillary: 262 mg/dL — ABNORMAL HIGH (ref 65–99)

## 2017-11-13 LAB — BLADDER SCAN AMB NON-IMAGING

## 2017-11-13 LAB — ABO/RH: ABO/RH(D): A POS

## 2017-11-13 LAB — TYPE AND SCREEN
ABO/RH(D): A POS
ANTIBODY SCREEN: NEGATIVE

## 2017-11-13 SURGERY — CYSTOSCOPY, WITH BLADDER FULGURATION
Anesthesia: General | Wound class: Clean Contaminated

## 2017-11-13 MED ORDER — TAMSULOSIN HCL 0.4 MG PO CAPS
0.4000 mg | ORAL_CAPSULE | Freq: Every day | ORAL | Status: DC
  Administered 2017-11-13 – 2017-11-14 (×2): 0.4 mg via ORAL
  Filled 2017-11-13 (×2): qty 1

## 2017-11-13 MED ORDER — FINASTERIDE 5 MG PO TABS
5.0000 mg | ORAL_TABLET | Freq: Every day | ORAL | Status: DC
  Administered 2017-11-13 – 2017-11-14 (×2): 5 mg via ORAL
  Filled 2017-11-13 (×2): qty 1

## 2017-11-13 MED ORDER — ALLOPURINOL 100 MG PO TABS
300.0000 mg | ORAL_TABLET | Freq: Every day | ORAL | Status: DC
  Administered 2017-11-13 – 2017-11-14 (×2): 300 mg via ORAL
  Filled 2017-11-13 (×2): qty 3

## 2017-11-13 MED ORDER — INSULIN ASPART 100 UNIT/ML ~~LOC~~ SOLN
4.0000 [IU] | Freq: Three times a day (TID) | SUBCUTANEOUS | Status: DC
  Administered 2017-11-13 – 2017-11-14 (×2): 4 [IU] via SUBCUTANEOUS
  Filled 2017-11-13 (×3): qty 1

## 2017-11-13 MED ORDER — FENTANYL CITRATE (PF) 100 MCG/2ML IJ SOLN: 25.0000 ug | INTRAMUSCULAR | Status: DC | PRN

## 2017-11-13 MED ORDER — MORPHINE SULFATE (PF) 2 MG/ML IV SOLN: 2.0000 mg | INTRAVENOUS | Status: DC | PRN

## 2017-11-13 MED ORDER — ONDANSETRON HCL 4 MG/2ML IJ SOLN: 4.0000 mg | Freq: Once | INTRAMUSCULAR | Status: DC | PRN

## 2017-11-13 MED ORDER — SODIUM CHLORIDE 0.9 % IV SOLN
INTRAVENOUS | Status: DC
  Administered 2017-11-13 – 2017-11-14 (×3): via INTRAVENOUS

## 2017-11-13 MED ORDER — DIPHENHYDRAMINE HCL 50 MG/ML IJ SOLN: 12.5000 mg | Freq: Four times a day (QID) | INTRAMUSCULAR | Status: DC | PRN

## 2017-11-13 MED ORDER — CEFAZOLIN SODIUM 1 G IJ SOLR
INTRAMUSCULAR | Status: AC
  Filled 2017-11-13: qty 20

## 2017-11-13 MED ORDER — OXYCODONE-ACETAMINOPHEN 5-325 MG PO TABS: 1.0000 | ORAL_TABLET | ORAL | Status: DC | PRN

## 2017-11-13 MED ORDER — ROCURONIUM BROMIDE 100 MG/10ML IV SOLN
INTRAVENOUS | Status: DC | PRN
  Administered 2017-11-13: 15 mg via INTRAVENOUS
  Administered 2017-11-13: 5 mg via INTRAVENOUS

## 2017-11-13 MED ORDER — OXYBUTYNIN CHLORIDE 5 MG PO TABS: 5.0000 mg | ORAL_TABLET | Freq: Three times a day (TID) | ORAL | Status: DC | PRN

## 2017-11-13 MED ORDER — CEPHALEXIN 500 MG PO CAPS: 500.0000 mg | ORAL_CAPSULE | Freq: Three times a day (TID) | ORAL | 0 refills | Status: AC

## 2017-11-13 MED ORDER — PRAVASTATIN SODIUM 20 MG PO TABS
40.0000 mg | ORAL_TABLET | Freq: Every day | ORAL | Status: DC
  Administered 2017-11-13: 40 mg via ORAL
  Filled 2017-11-13: qty 2

## 2017-11-13 MED ORDER — DEXAMETHASONE SODIUM PHOSPHATE 10 MG/ML IJ SOLN
INTRAMUSCULAR | Status: DC | PRN
Start: 2017-11-13 — End: 2017-11-13
  Administered 2017-11-13: 5 mg via INTRAVENOUS

## 2017-11-13 MED ORDER — FENTANYL CITRATE (PF) 100 MCG/2ML IJ SOLN
INTRAMUSCULAR | Status: DC | PRN
  Administered 2017-11-13: 100 ug via INTRAVENOUS

## 2017-11-13 MED ORDER — ARTIFICIAL TEARS OPHTHALMIC OINT
TOPICAL_OINTMENT | Freq: Every day | OPHTHALMIC | Status: DC | PRN
  Filled 2017-11-13: qty 3.5

## 2017-11-13 MED ORDER — SUCCINYLCHOLINE CHLORIDE 20 MG/ML IJ SOLN
INTRAMUSCULAR | Status: AC
  Filled 2017-11-13: qty 1

## 2017-11-13 MED ORDER — ONDANSETRON HCL 4 MG/2ML IJ SOLN: 4.0000 mg | INTRAMUSCULAR | Status: DC | PRN

## 2017-11-13 MED ORDER — MIDAZOLAM HCL 5 MG/5ML IJ SOLN
INTRAMUSCULAR | Status: DC | PRN
  Administered 2017-11-13: 2 mg via INTRAVENOUS

## 2017-11-13 MED ORDER — SODIUM CHLORIDE 0.9 % IV SOLN
INTRAVENOUS | Status: DC | PRN
  Administered 2017-11-13 (×2): via INTRAVENOUS

## 2017-11-13 MED ORDER — BELLADONNA ALKALOIDS-OPIUM 16.2-60 MG RE SUPP: 1.0000 | Freq: Four times a day (QID) | RECTAL | Status: DC | PRN

## 2017-11-13 MED ORDER — ONDANSETRON HCL 4 MG/2ML IJ SOLN
INTRAMUSCULAR | Status: AC
  Filled 2017-11-13: qty 2

## 2017-11-13 MED ORDER — FENTANYL CITRATE (PF) 100 MCG/2ML IJ SOLN
INTRAMUSCULAR | Status: AC
  Filled 2017-11-13: qty 2

## 2017-11-13 MED ORDER — INSULIN ASPART 100 UNIT/ML ~~LOC~~ SOLN
0.0000 [IU] | Freq: Three times a day (TID) | SUBCUTANEOUS | Status: DC
  Administered 2017-11-13: 3 [IU] via SUBCUTANEOUS
  Administered 2017-11-14: 2 [IU] via SUBCUTANEOUS
  Filled 2017-11-13 (×2): qty 1

## 2017-11-13 MED ORDER — ROCURONIUM BROMIDE 50 MG/5ML IV SOLN
INTRAVENOUS | Status: AC
  Filled 2017-11-13: qty 1

## 2017-11-13 MED ORDER — POLYETHYL GLYCOL-PROPYL GLYCOL 0.4-0.3 % OP GEL
1.0000 "application " | Freq: Every day | OPHTHALMIC | Status: DC | PRN
  Filled 2017-11-13: qty 10

## 2017-11-13 MED ORDER — CEFAZOLIN SODIUM-DEXTROSE 1-4 GM/50ML-% IV SOLN
INTRAVENOUS | Status: DC | PRN
  Administered 2017-11-13: 2 g via INTRAVENOUS

## 2017-11-13 MED ORDER — SUGAMMADEX SODIUM 200 MG/2ML IV SOLN
INTRAVENOUS | Status: DC | PRN
Start: 2017-11-13 — End: 2017-11-13
  Administered 2017-11-13: 200 mg via INTRAVENOUS

## 2017-11-13 MED ORDER — MIDAZOLAM HCL 2 MG/2ML IJ SOLN
INTRAMUSCULAR | Status: AC
  Filled 2017-11-13: qty 2

## 2017-11-13 MED ORDER — ONDANSETRON HCL 4 MG/2ML IJ SOLN
INTRAMUSCULAR | Status: DC | PRN
Start: 2017-11-13 — End: 2017-11-13
  Administered 2017-11-13: 4 mg via INTRAVENOUS

## 2017-11-13 MED ORDER — PROPOFOL 10 MG/ML IV BOLUS
INTRAVENOUS | Status: DC | PRN
  Administered 2017-11-13: 180 mg via INTRAVENOUS

## 2017-11-13 MED ORDER — DOCUSATE SODIUM 100 MG PO CAPS
100.0000 mg | ORAL_CAPSULE | Freq: Two times a day (BID) | ORAL | Status: DC
  Administered 2017-11-13 – 2017-11-14 (×2): 100 mg via ORAL
  Filled 2017-11-13 (×2): qty 1

## 2017-11-13 MED ORDER — DEXTROSE 5 % IV SOLN
1.0000 g | Freq: Three times a day (TID) | INTRAVENOUS | Status: AC
  Administered 2017-11-13 (×2): 1 g via INTRAVENOUS
  Filled 2017-11-13 (×2): qty 10

## 2017-11-13 MED ORDER — ACETAMINOPHEN 325 MG PO TABS: 650.0000 mg | ORAL_TABLET | ORAL | Status: DC | PRN

## 2017-11-13 MED ORDER — SUCCINYLCHOLINE CHLORIDE 20 MG/ML IJ SOLN
INTRAMUSCULAR | Status: DC | PRN
Start: 2017-11-13 — End: 2017-11-13
  Administered 2017-11-13: 120 mg via INTRAVENOUS

## 2017-11-13 MED ORDER — GLIPIZIDE ER 2.5 MG PO TB24
2.5000 mg | ORAL_TABLET | Freq: Every day | ORAL | Status: DC
  Administered 2017-11-14: 2.5 mg via ORAL
  Filled 2017-11-13: qty 1

## 2017-11-13 MED ORDER — INSULIN ASPART 100 UNIT/ML ~~LOC~~ SOLN: 0.0000 [IU] | Freq: Every day | SUBCUTANEOUS | Status: DC

## 2017-11-13 MED ORDER — PROPOFOL 10 MG/ML IV BOLUS
INTRAVENOUS | Status: AC
  Filled 2017-11-13: qty 20

## 2017-11-13 MED ORDER — PHENYLEPHRINE HCL 10 MG/ML IJ SOLN
INTRAMUSCULAR | Status: DC | PRN
Start: 2017-11-13 — End: 2017-11-13
  Administered 2017-11-13 (×2): 100 ug via INTRAVENOUS

## 2017-11-13 MED ORDER — DIPHENHYDRAMINE HCL 12.5 MG/5ML PO ELIX
12.5000 mg | ORAL_SOLUTION | Freq: Four times a day (QID) | ORAL | Status: DC | PRN
  Filled 2017-11-13: qty 5

## 2017-11-13 MED ORDER — DEXAMETHASONE SODIUM PHOSPHATE 10 MG/ML IJ SOLN
INTRAMUSCULAR | Status: AC
  Filled 2017-11-13: qty 1

## 2017-11-13 SURGICAL SUPPLY — 22 items
ADAPTER IRRIG TUBE 2 SPIKE SOL (ADAPTER) ×4 IMPLANT
BAG DRAIN CYSTO-URO LG1000N (MISCELLANEOUS) ×2 IMPLANT
BAG URO DRAIN 4000ML (MISCELLANEOUS) ×2 IMPLANT
CATH FOL 2WAY LX 24X30 (CATHETERS) IMPLANT
CATH FOLEY 3WAY 30CC 22FR (CATHETERS) ×2 IMPLANT
DRAPE UTILITY 15X26 TOWEL STRL (DRAPES) ×2 IMPLANT
ELECT LOOP 22F BIPOLAR SML (ELECTROSURGICAL)
ELECTRODE LOOP 22F BIPOLAR SML (ELECTROSURGICAL) IMPLANT
GLOVE BIO SURGEON STRL SZ 6.5 (GLOVE) ×2 IMPLANT
GOWN STRL REUS W/ TWL LRG LVL3 (GOWN DISPOSABLE) ×2 IMPLANT
GOWN STRL REUS W/TWL LRG LVL3 (GOWN DISPOSABLE) ×2
HOLDER FOLEY CATH W/STRAP (MISCELLANEOUS) ×2 IMPLANT
KIT RM TURNOVER CYSTO AR (KITS) ×2 IMPLANT
LOOP CUT BIPOLAR 24F LRG (ELECTROSURGICAL) ×2 IMPLANT
PACK CYSTO AR (MISCELLANEOUS) ×2 IMPLANT
SET IRRIG Y TYPE TUR BLADDER L (SET/KITS/TRAYS/PACK) ×2 IMPLANT
SET IRRIGATING DISP (SET/KITS/TRAYS/PACK) ×2 IMPLANT
SOL .9 NS 3000ML IRR  AL (IV SOLUTION) ×6
SOL .9 NS 3000ML IRR UROMATIC (IV SOLUTION) ×6 IMPLANT
SYR TOOMEY 50ML (SYRINGE) ×2 IMPLANT
SYRINGE IRR TOOMEY STRL 70CC (SYRINGE) ×2 IMPLANT
WATER STERILE IRR 1000ML POUR (IV SOLUTION) ×2 IMPLANT

## 2017-11-13 NOTE — Progress Notes (Addendum)
Pt called this morning stating his foley did not drain much over night and it was very bloody. Pt was added to nurse schedule. Bladder Irrigation  Due to gross hematuria patient is present today for a bladder irrigation. Patient was cleaned and prepped in a sterile fashion. 36mL of normal saline was attempted to push through 74fr catheter. Normal saline would not go into bladder. 18 fr foley was removed. 18 fr coude was replaced. Once again bladder irrigation was attempted without success. Pt was noted to be having bladder spasms when attempting irrigation. Blood and urine return was noted to be 350cc. PVR was performed at >999. Went to get help from Fonnie Jarvis, [place].   Toniann Fail, LPN  Blood pressure (!) 167/123, pulse (!) 103, height 5\' 7"  (1.702 m), weight 223 lb (101.2 kg).

## 2017-11-13 NOTE — Anesthesia Preprocedure Evaluation (Addendum)
Anesthesia Evaluation  Patient identified by MRN, date of birth, ID band Patient awake    Reviewed: Allergy & Precautions, NPO status , Patient's Chart, lab work & pertinent test results, reviewed documented beta blocker date and time   History of Anesthesia Complications Negative for: history of anesthetic complications  Airway Mallampati: II  TM Distance: >3 FB Neck ROM: Full    Dental  (+) Chipped   Pulmonary sleep apnea and Continuous Positive Airway Pressure Ventilation , neg COPD,    breath sounds clear to auscultation- rhonchi (-) wheezing      Cardiovascular hypertension, Pt. on medications (-) CAD, (-) Past MI and (-) Cardiac Stents + dysrhythmias + pacemaker (placed for symptomatic bradycardia)  Rhythm:Regular Rate:Normal - Systolic murmurs and - Diastolic murmurs    Neuro/Psych negative neurological ROS  negative psych ROS   GI/Hepatic negative GI ROS, Neg liver ROS,   Endo/Other  diabetes, Type 2, Oral Hypoglycemic Agents  Renal/GU Renal disease     Musculoskeletal  (+) Arthritis ,   Abdominal (+) + obese,   Peds  Hematology negative hematology ROS (+)   Anesthesia Other Findings Past Medical History: No date: Arthritis     Comment:  "hands, wrists" (10/16/2017) No date: Bilateral hydronephrosis     Comment:  a. 08/2017 in setting of bladder outlet obstruction. No date: Bladder outlet obstruction     Comment:  a. 08/2017 noted in setting of rising creatinine-->d/c'd               w/ foley and plan for outpt urology f/u. No date: Childhood asthma     Comment:  "outgrew it" No date: CKD (chronic kidney disease), stage III ([place]) No date: Gout     Comment:  "on daily RX" (10/16/2017) No date: Hyperlipidemia No date: Hyperlipidemia No date: Hypertension No date: OSA on CPAP 10/16/2017: Presence of permanent cardiac pacemaker No date: Self-catheterizes urinary bladder No date: Skin cancer  Comment:  "back" (10/16/2017) No date: Syncope     Comment:  a. 08/2017 Echo: EF 60-65%, no rwma, Gr1 DD, nl RV fxn;               b. 08/2017 carotid u/s: unremarkable. No date: Trifascicular block No date: Trigger finger, acquired No date: Type II diabetes mellitus ([place])   Reproductive/Obstetrics                            Anesthesia Physical Anesthesia Plan  ASA: III and emergent  Anesthesia Plan: General   Post-op Pain Management:    Induction: Intravenous, Rapid sequence and Cricoid pressure planned  PONV Risk Score and Plan: 1 and Dexamethasone and Ondansetron  Airway Management Planned: Oral ETT  Additional Equipment:   Intra-op Plan:   Post-operative Plan: Extubation in OR  Informed Consent: I have reviewed the patients History and Physical, chart, labs and discussed the procedure including the risks, benefits and alternatives for the proposed anesthesia with the patient or authorized representative who has indicated his/her understanding and acceptance.     Plan Discussed with: CRNA and Anesthesiologist  Anesthesia Plan Comments:        Anesthesia Quick Evaluation

## 2017-11-13 NOTE — OR Nursing (Signed)
Patient came in emergently with active bleeding. Patient had IV started, vitals and blood work complete. Patient did eat 2 crackers while at Dr Cherrie Gauze office and anesthesia is aware. Since patient was emergent this is the reason why charting is not complete. Consent was complete.

## 2017-11-13 NOTE — Op Note (Signed)
Date of procedure: 11/13/17  Preoperative diagnosis:  1. Hematuria 2. Foley catheter trauma  Postoperative diagnosis:  1. Same as above cystoscopy  Procedure: 1. Cystoscopy 2. Clot evacuation 3. Fulguration of bleeding vessels  Surgeon: Hollice Espy, MD  Anesthesia: General  Complications: None  Intraoperative findings: Large posterior false pass within bulbar urethra with severe bleeding from this area.  TURP defect appreciated, no active bleeding.  Bladder trabeculated, clots evacuated.  EBL: Minimal, although fairly significant blood loss prior to surgery  Specimens: None  Drains: 70 French three-way Foley catheter on CBI  Indication: Anthony Palmer is a 66 y.o. patient with history of BPH status post TURP 2 days ago.  He was able to void some prior to discharge and at the time of discharge, an 31 [place] coud catheter was placed but never drained appropriately.  He presented to our office today with a nondraining Foley and passing blood clots around the catheter.  The catheter was deflated, there is a profuse amount of bleeding which was pulsatile in nature.  The Foley catheter was unable to be replaced.  Due to concern for active bleeding, he was taken to the OR emergently for further management.  Informed consent was obtained.  Procedure:  The patient was taken to the operating room and general anesthesia was induced.  The patient was placed in the dorsal lithotomy position, prepped and draped in the usual sterile fashion, and preoperative antibiotics were administered. A preoperative time-out was performed.   Using a visual obturator and a 26 French resectoscope, the scope was advanced per urethra.  Within the bulbar urethra, there is a large amount of clot which had to be irrigated copiously in order to identify the true lumen.  There is a large false passed within the bulbar urethra posteriorly with pulsatile bleeding appreciated.  Navigation into the true prostatic  urethra was somewhat challenging.  This was ultimately achieved by advancing a wire through the true lumen and the scope over the wire.  Once scopes in the bladder, the bladder was cleared using a Toomey syringe to evacuate all clot burden.  A bipolar loop was then brought in and the bed of the prostatic fossa was again fulgurated as well as the bladder neck.  There is no active bleeding noted within the bladder or prostatic fossa itself but when the scope was back down to the sphincter and bulbar urethra, the bleeding was noted at this level.  The scope was then replaced.  Finally, a wire was replaced and the scope was removed.  A 22 French three-way Foley catheter was converted into a Council tip catheter and advanced over the wire into the proper position within the bladder bypassing the large false pass.  The balloon was filled with 30 cc of sterile water.  This was irrigated several times to confirm position within the bladder.  The patient was started on a slow drip CBI.  There is only a very small amount of bleeding around the catheter noted at this time.  Hemostasis was deemed adequate.  The patient was then cleaned and dried, repositioned in the supine position, reversed from anesthesia, taken to PACU in stable condition.  Plan: Patient will be admitted overnight again for CBI and observation.  We will repeat his CBC to the amount of blood loss appreciated our office.  We will titrate off the CBI in the morning.  If he does well, we will keep this Foley in place for approximately 1 week and potentially change  over a wire for smaller more comfortable catheter next week for an additional week.  Additionally, given the amount of urethral manipulation today, will start him on Keflex for a week.  Findings were discussed with the patient's family.  All questions answered.  Hollice Espy, M.D.

## 2017-11-13 NOTE — Anesthesia Postprocedure Evaluation (Signed)
Anesthesia Post Note  Patient: Anthony Palmer  Procedure(s) Performed: CYSTOSCOPY WITH FULGERATION (N/A )  Patient location during evaluation: PACU Anesthesia Type: General Level of consciousness: awake and alert Pain management: pain level controlled Vital Signs Assessment: post-procedure vital signs reviewed and stable Respiratory status: spontaneous breathing, nonlabored ventilation, respiratory function stable and patient connected to nasal cannula oxygen Cardiovascular status: blood pressure returned to baseline and stable Postop Assessment: no apparent nausea or vomiting Anesthetic complications: no     Last Vitals:  Vitals:   11/13/17 1251 11/13/17 1306  BP: (!) 138/95 (!) 135/96  Pulse: 96 87  Resp: (!) 22 (!) 23  Temp:    SpO2: 99% 100%    Last Pain:  Vitals:   11/13/17 1236  TempSrc:   PainSc: [place] S

## 2017-11-13 NOTE — Transfer of Care (Signed)
Immediate Anesthesia Transfer of Care Note  Patient: Anthony Palmer  Procedure(s) Performed: CYSTOSCOPY WITH FULGERATION (N/A )  Patient Location: PACU  Anesthesia Type:General  Level of Consciousness: sedated  Airway & Oxygen Therapy: Patient Spontanous Breathing and Patient connected to face mask oxygen  Post-op Assessment: Report given to RN and Post -op Vital signs reviewed and stable  Post vital signs: Reviewed and stable  Last Vitals:  Vitals:   11/13/17 1115 11/13/17 1221  BP: (!) 162/109 (!) 133/100  Pulse: (!) 106 96  Resp: 18 15  Temp: 36.9 C (!) 36.4 C  SpO2: 100% 99%    Last Pain:  Vitals:   11/13/17 1115  TempSrc: Tympanic         Complications: No apparent anesthesia complications

## 2017-11-13 NOTE — H&P (View-Only) (Signed)
   Subjective:    Patient ID: Anthony Palmer, male    DOB: 04/14/1951, 66 y.o.   MRN: [id number]  HPI 66 yo M POD1 s/p TURP with nondraining Foley since placement on discharge from the hospital by nursing staff.  He reports that overnight comes of bleeding around the catheter with minimal in the office today, the balloon was deflated  output.  In the office, active bleeding was noted from the penis, copious and pulsatile in nature.  Attempts at Foley replacement and irrigation unsuccessful.    He does feel somewhat faint.  Mildy tachycardic.    Past Medical History:  Diagnosis Date  . Arthritis    "hands, wrists" (10/16/2017)  . Bilateral hydronephrosis    a. 08/2017 in setting of bladder outlet obstruction.  . Bladder outlet obstruction    a. 08/2017 noted in setting of rising creatinine-->d/c'd w/ foley and plan for outpt urology f/u.  [name] Childhood asthma    "outgrew it"  . CKD (chronic kidney disease), stage III ([place])   . Gout    "on daily RX" (10/16/2017)  . Hyperlipidemia   . Hyperlipidemia   . Hypertension   . OSA on CPAP   . Presence of permanent cardiac pacemaker 10/16/2017  . Self-catheterizes urinary bladder   . Skin cancer    "back" (10/16/2017)  . Syncope    a. 08/2017 Echo: EF 60-65%, no rwma, Gr1 DD, nl RV fxn; b. 08/2017 carotid u/s: unremarkable.  . Trifascicular block   . Trigger finger, acquired   . Type II diabetes mellitus ([place])         Objective:   Physical Exam  Constitutional: He is oriented to person, place, and time. He appears well-developed.  HENT:  Head: Normocephalic and atraumatic.  Cardiovascular:  Mildy tachycardic.  Pulmonary/Chest: Effort normal and breath sounds normal.  Genitourinary:  Genitourinary Comments: Pulsatile bright red blood from phallus.  Large clots.  Neurological: He is alert and oriented to person, place, and time.  Skin:  Pale, clammy.  Psychiatric: He has a normal mood and affect.  Vitals reviewed.     Assessment  & Plan:   POD 1 s/p TURP, Foley trauma with active arterial bleeding.  Recommend emergent OR clot evac, fulguration given profuse bleeding which is not able to be controlled in office.  Patient brought up to OR emergently.  Discussed with anesthesia.    Consent discussed and signed.  Blood consent signed as well.  Will likely admit post op for observation.

## 2017-11-13 NOTE — Interval H&P Note (Signed)
History and Physical Interval Note:  11/13/2017 11:17 AM  Anthony Palmer  has presented today for surgery, with the diagnosis of actively bleeding  The various methods of treatment have been discussed with the patient and family. After consideration of risks, benefits and other options for treatment, the patient has consented to  Procedure(s): TRANSURETHRAL RESECTION OF THE PROSTATE (TURP) (N/A) as a surgical intervention .  The patient's history has been reviewed, patient examined, no change in status, stable for surgery.  I have reviewed the patient's chart and labs.  Questions were answered to the patient's satisfaction.     Hollice Espy

## 2017-11-13 NOTE — Progress Notes (Signed)
   Subjective:    Patient ID: Anthony Palmer, male    DOB: 1951-04-04, 66 y.o.   MRN: [id number]  HPI 66 yo M POD1 s/p TURP with nondraining Foley since placement on discharge from the hospital by nursing staff.  He reports that overnight comes of bleeding around the catheter with minimal in the office today, the balloon was deflated  output.  In the office, active bleeding was noted from the penis, copious and pulsatile in nature.  Attempts at Foley replacement and irrigation unsuccessful.    He does feel somewhat faint.  Mildy tachycardic.    Past Medical History:  Diagnosis Date  . Arthritis    "hands, wrists" (10/16/2017)  . Bilateral hydronephrosis    a. 08/2017 in setting of bladder outlet obstruction.  . Bladder outlet obstruction    a. 08/2017 noted in setting of rising creatinine-->d/c'd w/ foley and plan for outpt urology f/u.  [name] Childhood asthma    "outgrew it"  . CKD (chronic kidney disease), stage III ([place])   . Gout    "on daily RX" (10/16/2017)  . Hyperlipidemia   . Hyperlipidemia   . Hypertension   . OSA on CPAP   . Presence of permanent cardiac pacemaker 10/16/2017  . Self-catheterizes urinary bladder   . Skin cancer    "back" (10/16/2017)  . Syncope    a. 08/2017 Echo: EF 60-65%, no rwma, Gr1 DD, nl RV fxn; b. 08/2017 carotid u/s: unremarkable.  . Trifascicular block   . Trigger finger, acquired   . Type II diabetes mellitus ([place])         Objective:   Physical Exam  Constitutional: He is oriented to person, place, and time. He appears well-developed.  HENT:  Head: Normocephalic and atraumatic.  Cardiovascular:  Mildy tachycardic.  Pulmonary/Chest: Effort normal and breath sounds normal.  Genitourinary:  Genitourinary Comments: Pulsatile bright red blood from phallus.  Large clots.  Neurological: He is alert and oriented to person, place, and time.  Skin:  Pale, clammy.  Psychiatric: He has a normal mood and affect.  Vitals reviewed.     Assessment  & Plan:   POD 1 s/p TURP, Foley trauma with active arterial bleeding.  Recommend emergent OR clot evac, fulguration given profuse bleeding which is not able to be controlled in office.  Patient brought up to OR emergently.  Discussed with anesthesia.    Consent discussed and signed.  Blood consent signed as well.  Will likely admit post op for observation.

## 2017-11-13 NOTE — Anesthesia Post-op Follow-up Note (Signed)
Anesthesia QCDR form completed.        

## 2017-11-13 NOTE — Anesthesia Procedure Notes (Signed)
Procedure Name: Intubation Date/Time: 11/13/2017 11:35 AM Performed by: Dionne Bucy, CRNA Pre-anesthesia Checklist: Patient identified, Patient being monitored, Timeout performed, Emergency Drugs available and Suction available Patient Re-evaluated:Patient Re-evaluated prior to induction Oxygen Delivery Method: Circle system utilized Preoxygenation: Pre-oxygenation with 100% oxygen Induction Type: IV induction, Rapid sequence and Cricoid Pressure applied Laryngoscope Size: McGraph and 4 Grade View: Grade I Tube type: Oral Tube size: 7.5 mm Number of attempts: 1 Airway Equipment and Method: Stylet and Video-laryngoscopy Placement Confirmation: ETT inserted through vocal cords under direct vision,  positive ETCO2 and breath sounds checked- equal and bilateral Secured at: 23 cm Tube secured with: Tape Dental Injury: Teeth and Oropharynx as per pre-operative assessment

## 2017-11-13 NOTE — Progress Notes (Signed)
Per Dr. Erlene Quan a [phone] cath was to be placed and irrigation attempted for clot evacuation.  Patient was cleaned and prepped in a sterile fashion with betadine.  A 22 FR foley catheter was inserted, and bloody urine was noted no urine return.  The balloon was attempted to be filled with 10cc of sterile water but resistance was met, so balloon was not inflated.  Irrigation was attempted with out success and Dr. Erlene Quan was asked to assist. 22FR cath was removed and a large blood clot followed. Blood was noted in a pulsating fashion coming from the penis.   Preformed by: Fonnie Jarvis, CMA , Toniann Fail, LPN and Dr. Hollice Espy  Additional notes/ Follow up: Patient was having bladder spasms and was uncomfortable during the procedure. Patient states that he had not ate and was feeling like his blood sugar was dropping , he was given some juice and 2 crackers.   During room clean up it was noted that the bloody urine in the urinals collected during the attempted irrigation totaled around 752m this had coagulated.

## 2017-11-13 NOTE — Addendum Note (Signed)
Addended by: Toniann Fail C on: 11/13/2017 11:43 AM   Modules accepted: Orders

## 2017-11-14 ENCOUNTER — Encounter: Payer: Self-pay | Admitting: Urology

## 2017-11-14 DIAGNOSIS — T8383XA Hemorrhage of genitourinary prosthetic devices, implants and grafts, initial encounter: Secondary | ICD-10-CM | POA: Diagnosis not present

## 2017-11-14 LAB — BASIC METABOLIC PANEL
ANION GAP: 7 (ref 5–15)
BUN: 23 mg/dL — ABNORMAL HIGH (ref 6–20)
CALCIUM: 8.9 mg/dL (ref 8.9–10.3)
CO2: 24 mmol/L (ref 22–32)
Chloride: 107 mmol/L (ref 101–111)
Creatinine, Ser: 1.41 mg/dL — ABNORMAL HIGH (ref 0.61–1.24)
GFR, EST AFRICAN AMERICAN: 59 mL/min — AB (ref >60)
GFR, EST NON AFRICAN AMERICAN: 51 mL/min — AB (ref >60)
Glucose, Bld: 153 mg/dL — ABNORMAL HIGH (ref 65–99)
Potassium: 5 mmol/L (ref 3.5–5.1)
SODIUM: 138 mmol/L (ref 135–145)

## 2017-11-14 LAB — CBC
HEMATOCRIT: 34.1 % — AB (ref 40.0–52.0)
Hemoglobin: 11.4 g/dL — ABNORMAL LOW (ref 13.0–18.0)
MCH: 30.6 pg (ref 26.0–34.0)
MCHC: 33.3 g/dL (ref 32.0–36.0)
MCV: 91.8 fL (ref 80.0–100.0)
PLATELETS: 218 10*3/uL (ref 150–440)
RBC: 3.72 MIL/uL — ABNORMAL LOW (ref 4.40–5.90)
RDW: 14.6 % — AB (ref 11.5–14.5)
WBC: 13.4 10*3/uL — AB (ref 3.8–10.6)

## 2017-11-14 LAB — GLUCOSE, CAPILLARY
GLUCOSE-CAPILLARY: 139 mg/dL — AB (ref 65–99)
GLUCOSE-CAPILLARY: 119 mg/dL — AB (ref 65–99)

## 2017-11-14 MED ORDER — POLYETHYLENE GLYCOL 3350 17 GM/SCOOP PO POWD: 1.0000 | Freq: Every day | ORAL | 0 refills | Status: DC

## 2017-11-14 NOTE — Progress Notes (Signed)
Pt discharged per MD order. IV removed. Catheter connected to standard leg bag. Education completed for catheter care. Pt and family verbalized understanding. Medication education completed. Discharge instructions reviewed with pt. Pt and wife verbalized understanding. All questions answered to satisfaction. Pt given a wheelchair for discharge. Did not wish to be taken to car by staff due to visiting another person in hospital.

## 2017-11-14 NOTE — Discharge Instructions (Signed)
Indwelling Urinary Catheter Care, Adult  Take good care of your catheter to keep it working and to prevent problems.  How to wear your catheter  Attach your catheter to your leg with tape (adhesive tape) or a leg strap. Make sure it is not too tight. If you use tape, remove any bits of tape that are already on the catheter.  How to wear a drainage bag  You should have:   A large overnight bag.   A small leg bag.    Overnight Bag  You may wear the overnight bag at any time. Always keep the bag below the level of your bladder but off the floor. When you sleep, put a clean plastic bag in a wastebasket. Then hang the bag inside the wastebasket.  Leg Bag  Never wear the leg bag at night. Always wear the leg bag below your knee. Keep the leg bag secure with a leg strap or tape.  How to care for your skin   Clean the skin around the catheter at least once every day.   Shower every day. Do not take baths.   Put creams, lotions, or ointments on your genital area only as told by your doctor.   Do not use powders, sprays, or lotions on your genital area.  How to clean your catheter and your skin  1. Wash your hands with soap and water.  2. Wet a washcloth in warm water and gentle (mild) soap.  3. Use the washcloth to clean the skin where the catheter enters your body. Clean downward and wipe away from the catheter in small circles. Do not wipe toward the catheter.  4. Pat the area dry with a clean towel. Make sure to clean off all soap.  How to care for your drainage bags  Empty your drainage bag when it is ?- full or at least 2-3 times a day. Replace your drainage bag once a month or sooner if it starts to smell bad or look dirty. Do not clean your drainage bag unless told by your doctor.  Emptying a drainage bag    Supplies Needed   Rubbing alcohol.   Gauze pad or cotton ball.   Tape or a leg strap.    Steps  1. Wash your hands with soap and water.  2. Separate (detach) the bag from your leg.  3. Hold the bag over  the toilet or a clean container. Keep the bag below your hips and bladder. This stops pee (urine) from going back into the tube.  4. Open the pour spout at the bottom of the bag.  5. Empty the pee into the toilet or container. Do not let the pour spout touch any surface.  6. Put rubbing alcohol on a gauze pad or cotton ball.  7. Use the gauze pad or cotton ball to clean the pour spout.  8. Close the pour spout.  9. Attach the bag to your leg with tape or a leg strap.  10. Wash your hands.    Changing a drainage bag  Supplies Needed   Alcohol wipes.   A clean drainage bag.   Adhesive tape or a leg strap.    Steps  1. Wash your hands with soap and water.  2. Separate the dirty bag from your leg.  3. Pinch the rubber catheter with your fingers so that pee does not spill out.  4. Separate the catheter tube from the drainage tube where these tubes connect (at the   connection valve). Do not let the tubes touch any surface.  5. Clean the end of the catheter tube with an alcohol wipe. Use a different alcohol wipe to clean the end of the drainage tube.  6. Connect the catheter tube to the drainage tube of the clean bag.  7. Attach the new bag to the leg with adhesive tape or a leg strap.  8. Wash your hands.    How to prevent infection and other problems   Never pull on your catheter or try to remove it. Pulling can damage tissue in your body.   Always wash your hands before and after touching your catheter.   If a leg strap gets wet, replace it with a dry one.   Drink enough fluids to keep your pee clear or pale yellow, or as told by your doctor.   Do not let the drainage bag or tubing touch the floor.   Wear cotton underwear.   If you are male, wipe from front to back after you poop (have a bowel movement).   Check on the catheter often to make sure it works and the tubing is not twisted.  Get help if:   Your pee is cloudy.   Your pee smells unusually bad.   Your pee is not draining into the bag.   Your  tube gets clogged.   Your catheter starts to leak.   Your bladder feels full.  Get help right away if:   You have redness, swelling, or pain where the catheter enters your body.   You have fluid, pus, or a bad smell coming from the area where the catheter enters your body.   The area where the catheter enters your body feels warm.   You have a fever.   You have pain in your:  ? Stomach (abdomen).  ? Legs.  ? Lower back.  ? Bladder.   You see blood fill the catheter.   Your pee is pink or red.   You feel sick to your stomach (nauseous).   You throw up (vomit).   You have chills.   Your catheter gets pulled out.  This information is not intended to replace advice given to you by your health care provider. Make sure you discuss any questions you have with your health care provider.  Document Released: 04/06/2013 Document Revised: 11/07/2016 Document Reviewed: 05/25/2014  Elsevier Interactive Patient Education  2018 [employer]

## 2017-11-14 NOTE — Plan of Care (Signed)
Patient verbalized understanding of plan of care. No questions at this time.

## 2017-11-19 ENCOUNTER — Ambulatory Visit (INDEPENDENT_AMBULATORY_CARE_PROVIDER_SITE_OTHER): Payer: Medicare Other | Admitting: Urology

## 2017-11-19 ENCOUNTER — Encounter: Payer: Self-pay | Admitting: Urology

## 2017-11-19 VITALS — BP 118/74 | HR 73 | Ht 68.0 in | Wt 224.0 lb

## 2017-11-19 DIAGNOSIS — N401 Enlarged prostate with lower urinary tract symptoms: Secondary | ICD-10-CM | POA: Diagnosis not present

## 2017-11-19 DIAGNOSIS — S3730XD Unspecified injury of urethra, subsequent encounter: Secondary | ICD-10-CM

## 2017-11-19 DIAGNOSIS — N138 Other obstructive and reflux uropathy: Secondary | ICD-10-CM | POA: Diagnosis not present

## 2017-11-19 NOTE — Progress Notes (Signed)
   11/19/17  CC:  Chief Complaint  Patient presents with  . Post-op Follow-up    HPI: 66 year old male with urinary retention status post TURP complicated by Foley catheter trauma requiring return to the OR and readmission.  Ultimately, he was discharged home with a large three-way catheter in place last week.  He returns today for catheter exchange over a wire.  Overall, he is doing well.  His urine has been draining well.  No issues from the catheter other than discomfort from his size.  He is asking today if he can return to work.  Vitals:   11/19/17 0923  BP: 118/74  Pulse: 73   NED. A&Ox3.   No respiratory distress   Abd soft, NT, ND Normal phallus with bilateral descended testicles  Catheter Exchange Procedure Note  Patient identification was confirmed and patient was prepped using Betadine solution.    At this point time, a sterile drape was applied.  His previous 3 Foley catheter converted into a Council tip was cannulated with a sensor wire.  The wire was left in the bladder and the catheter was removed after the balloon was adequately deflated.  The penis was prepped with Betadine solution.  A new 18 [place] Council tip daily was advanced over the wire into the bladder.  The balloon was filled with 10 cc of sterile water.  The wire was removed and there was return of light pink urine.  The Foley bag was attached and secured to the patient's leg.  Post-Procedure: - Patient tolerated the procedure well  Assessment/ Plan:  1. BPH with urinary obstruction Status post TURP Continue flomax and finasteride for the time being  2. Urethral trauma, subsequent encounter Catheter exchange today over a wire for more comfortable catheter We will leave it it additional week and then plan for voiding trial If he is unable to void next week, he will return to clean intermittent catheterization as needed   Return for keep f/u with me next month, nurse visit next week for voiding  trial.

## 2017-11-25 ENCOUNTER — Ambulatory Visit: Payer: Medicare Other

## 2017-11-25 NOTE — Discharge Summary (Signed)
Physician Discharge Summary  Patient ID: Anthony Palmer MRN: [id number] DOB/AGE: [birth year] 66 y.o.  Admit date: 11/13/2017 Discharge date: 12/14/2017  Admission Diagnoses: Gross hematuria Discharge Diagnoses:  Active Problems:   Hematuria   Discharged Condition: good  Hospital Course: The patient tolerated the procedure well and was transferred to the floor on IV pain meds, IV fluid. On POD#1 pt was started on regular diet and they ambulated in the halls. His CBI was discontinued on POD#1 since his urine was clear. Prior to discharge the pt was tolerating a regular diet, pain was controlled on PO pain meds, they were ambulating without difficulty, and they had normal bowel function.   Consults: None  Significant Diagnostic Studies: none  Treatments: surgery: cystocopy with fulgeration of prostatic fossa.  Discharge Exam: Blood pressure 139/77, pulse 67, temperature 97.8 F (36.6 C), temperature source Oral, resp. rate 18, height 5\' 7"  (1.702 m), weight 100.7 kg (222 lb), SpO2 100 %. General appearance: alert, cooperative and appears stated age Head: Normocephalic, without obvious abnormality, atraumatic Neck: no adenopathy, no carotid bruit, no JVD, supple, symmetrical, trachea midline and thyroid not enlarged, symmetric, no tenderness/mass/nodules Resp: clear to auscultation bilaterally Cardio: regular rate and rhythm, S1, S2 normal, no murmur, click, rub or gallop GI: soft, non-tender; bowel sounds normal; no masses,  no organomegaly Extremities: extremities normal, atraumatic, no cyanosis or edema Neurologic: Grossly normal  Disposition: 01-Home or Self Care  Discharge Instructions    Discharge patient   Complete by:  As directed    Discharge disposition:  01-Home or Self Care   Discharge patient date:  11/14/2017     Allergies as of 11/14/2017   No Known Allergies     Medication List    TAKE these medications   allopurinol 300 MG tablet Commonly known as:   ZYLOPRIM Take 300 mg by mouth daily.   docusate sodium 100 MG capsule Commonly known as:  COLACE Take 1 capsule (100 mg total) 2 (two) times daily by mouth.   finasteride 5 MG tablet Commonly known as:  PROSCAR Take 1 tablet (5 mg total) by mouth daily.   glipiZIDE 2.5 MG 24 hr tablet Commonly known as:  GLUCOTROL XL Take 2.5 mg by mouth daily with breakfast.   HYDROcodone-acetaminophen 5-325 MG tablet Commonly known as:  NORCO/VICODIN Take 1-2 tablets every 6 (six) hours as needed by mouth for moderate pain.   lovastatin 40 MG tablet Commonly known as:  MEVACOR Take 40 mg by mouth at bedtime.   polyethylene glycol powder powder Commonly known as:  MIRALAX Take 255 g by mouth daily.   SYSTANE 0.4-0.3 % Gel ophthalmic gel Generic drug:  Polyethyl Glycol-Propyl Glycol Place 1 application into both eyes daily as needed (dry eyes).   SYSTANE NIGHTTIME Oint Place 1 application into the right eye at bedtime.   tamsulosin 0.4 MG Caps capsule Commonly known as:  FLOMAX Take 0.4 mg by mouth daily.     ASK your doctor about these medications   cephALEXin 500 MG capsule Commonly known as:  KEFLEX Take 1 capsule (500 mg total) by mouth 3 (three) times daily for 10 days. Ask about: Should I take this medication?      Follow-up Information    [facility]. Call on 11/19/2017.           Signed: Nicolette Bang 11/25/2017, 7:45 AM

## 2017-11-29 ENCOUNTER — Ambulatory Visit (INDEPENDENT_AMBULATORY_CARE_PROVIDER_SITE_OTHER): Payer: Medicare Other | Admitting: Family Medicine

## 2017-11-29 VITALS — BP 134/94 | HR 78 | Ht 68.0 in | Wt 220.0 lb

## 2017-11-29 DIAGNOSIS — N138 Other obstructive and reflux uropathy: Secondary | ICD-10-CM

## 2017-11-29 DIAGNOSIS — N401 Enlarged prostate with lower urinary tract symptoms: Secondary | ICD-10-CM

## 2017-11-29 LAB — BLADDER SCAN AMB NON-IMAGING: Scan Result: 187

## 2017-11-29 NOTE — Progress Notes (Signed)
Pt elected not to have a fill and pull as he knows CIC. Foley was removed. Pt will RTC today by 3 for PVR.   Blood pressure (!) 134/94, pulse 78, height 5\' 8"  (1.727 m), weight 220 lb (99.8 kg).  Patient returned today for PVR. Result was 187. He was sent home and will return on 12/19 for office visit.

## 2017-12-11 ENCOUNTER — Encounter: Payer: Self-pay | Admitting: Urology

## 2017-12-11 ENCOUNTER — Ambulatory Visit (INDEPENDENT_AMBULATORY_CARE_PROVIDER_SITE_OTHER): Payer: Medicare Other | Admitting: Urology

## 2017-12-11 VITALS — BP 123/75 | HR 67 | Ht 68.0 in | Wt 224.0 lb

## 2017-12-11 DIAGNOSIS — N400 Enlarged prostate without lower urinary tract symptoms: Secondary | ICD-10-CM

## 2017-12-11 LAB — BLADDER SCAN AMB NON-IMAGING

## 2017-12-11 NOTE — Progress Notes (Signed)
12/11/2017 9:52 AM   Anthony Palmer 02/05/1951 [id number]  Referring provider: Maryland Pink, MD [address] [facility] [place], [place] [address]  Chief Complaint  Patient presents with  . Benign Prostatic Hypertrophy    4wk w/Blader scan    HPI: 66 year old male who returns to the office today status post TURP on 11/12/2017.  Unfortunately, he had a traumatic catheter replacement prior to discharge and return to our office the following day with severe penile urethral bleeding and hematuria requiring return to the operating room.  He will return to our office on 11/29/2017 for voiding trial.  He has been voiding spontaneously since that time.  He says he has been emptying his bladder.  He reports that his stream is excellent and significantly improved compared to prior to surgery.  He denies any frequency or urgency.  No dysuria or gross hematuria.  He is extremely pleased.  PVR today is 0.   Surgical pathology was consistent with benign prostatic tissue with glandular and stromal hyperplasia, nonspecific inflammation and benign urothelial mucosa.  70 g of tissue were resected.  Prior to surgery, he was in urinary retention (large volume) with bilateral hydroureteronephrosis.  He had progressively worsening obstructive urinary symptoms.  He is currently on Flomax and finasteride.  Most recent PSA 1.46 on 06/2017.    PMH: Past Medical History:  Diagnosis Date  . Arthritis    "hands, wrists" (10/16/2017)  . Bilateral hydronephrosis    a. 08/2017 in setting of bladder outlet obstruction.  . Bladder outlet obstruction    a. 08/2017 noted in setting of rising creatinine-->d/c'd w/ foley and plan for outpt urology f/u.  [name] Childhood asthma    "outgrew it"  . CKD (chronic kidney disease), stage III ([place])   . Gout    "on daily RX" (10/16/2017)  . Hyperlipidemia   . Hyperlipidemia   . Hypertension   . OSA on CPAP   . Presence of permanent cardiac pacemaker 10/16/2017    . Self-catheterizes urinary bladder   . Skin cancer    "back" (10/16/2017)  . Syncope    a. 08/2017 Echo: EF 60-65%, no rwma, Gr1 DD, nl RV fxn; b. 08/2017 carotid u/s: unremarkable.  . Trifascicular block   . Trigger finger, acquired   . Type II diabetes mellitus ([place])     Surgical History: Past Surgical History:  Procedure Laterality Date  . COLONOSCOPY WITH PROPOFOL N/A 07/23/2016   Procedure: COLONOSCOPY WITH PROPOFOL;  Surgeon: Lollie Sails, MD;  Location: [facility] ENDOSCOPY;  Service: Endoscopy;  Laterality: N/A;  . CYSTOSCOPY W/ RETROGRADES Bilateral 11/11/2017   Procedure: CYSTOSCOPY WITH RETROGRADE PYELOGRAM;  Surgeon: Hollice Espy, MD;  Location: [facility] ORS;  Service: Urology;  Laterality: Bilateral;  . CYSTOSCOPY WITH FULGERATION N/A 11/13/2017   Procedure: CYSTOSCOPY WITH FULGERATION;  Surgeon: Hollice Espy, MD;  Location: [facility] ORS;  Service: Urology;  Laterality: N/A;  . INCISION AND DRAINAGE Left 2013   Hematoma left leg  . INSERT / REPLACE / REMOVE PACEMAKER  10/16/2017  . PACEMAKER IMPLANT N/A 10/16/2017   Procedure: PACEMAKER IMPLANT;  Surgeon: Deboraha Sprang, MD;  Location: [facility];  Service: Cardiovascular;  Laterality: N/A;  . SKIN CANCER EXCISION     "off my back"  . TRANSURETHRAL RESECTION OF PROSTATE N/A 11/11/2017   Procedure: TRANSURETHRAL RESECTION OF THE PROSTATE (TURP);  Surgeon: Hollice Espy, MD;  Location: [facility] ORS;  Service: Urology;  Laterality: N/A;    Home Medications:  Allergies  as of 12/11/2017   No Known Allergies     Medication List        Accurate as of 12/11/17  9:52 AM. Always use your most recent med list.          allopurinol 300 MG tablet Commonly known as:  ZYLOPRIM Take 300 mg by mouth daily.   docusate sodium 100 MG capsule Commonly known as:  COLACE Take 1 capsule (100 mg total) 2 (two) times daily by mouth.   finasteride 5 MG tablet Commonly known as:  PROSCAR Take 1 tablet (5 mg total) by mouth  daily.   glipiZIDE 2.5 MG 24 hr tablet Commonly known as:  GLUCOTROL XL Take 2.5 mg by mouth daily with breakfast.   HYDROcodone-acetaminophen 5-325 MG tablet Commonly known as:  NORCO/VICODIN Take 1-2 tablets every 6 (six) hours as needed by mouth for moderate pain.   lovastatin 40 MG tablet Commonly known as:  MEVACOR Take 40 mg by mouth at bedtime.   polyethylene glycol powder powder Commonly known as:  MIRALAX Take 255 g by mouth daily.   SYSTANE 0.4-0.3 % Gel ophthalmic gel Generic drug:  Polyethyl Glycol-Propyl Glycol Place 1 application into both eyes daily as needed (dry eyes).   SYSTANE NIGHTTIME Oint Place 1 application into the right eye at bedtime.       Allergies: No Known Allergies  Family History: Family History  Problem Relation Age of Onset  . Heart disease Mother   . Heart attack Mother   . Heart disease Father   . Heart attack Father   . Stroke Father     Social History:  reports that  has never smoked. he has never used smokeless tobacco. He reports that he does not drink alcohol or use drugs.  ROS: UROLOGY Frequent Urination?: No Hard to postpone urination?: No Burning/pain with urination?: No Get up at night to urinate?: No Leakage of urine?: No Urine stream starts and stops?: No Trouble starting stream?: No Do you have to strain to urinate?: No Blood in urine?: No Urinary tract infection?: No Sexually transmitted disease?: No Injury to kidneys or bladder?: No Painful intercourse?: No Weak stream?: No Erection problems?: No Penile pain?: No  Gastrointestinal Nausea?: No Vomiting?: No Indigestion/heartburn?: No Diarrhea?: No Constipation?: No  Constitutional Fever: No Night sweats?: No Weight loss?: No Fatigue?: No  Skin Skin rash/lesions?: No Itching?: No  Eyes Blurred vision?: No Double vision?: No  Ears/Nose/Throat Sore throat?: No Sinus problems?: No  Hematologic/Lymphatic Swollen glands?: No Easy  bruising?: No  Cardiovascular Leg swelling?: No Chest pain?: No  Respiratory Cough?: No Shortness of breath?: No  Endocrine Excessive thirst?: No  Musculoskeletal Back pain?: No Joint pain?: No  Neurological Headaches?: No Dizziness?: No  Psychologic Depression?: No Anxiety?: No  Physical Exam: BP 123/75   Pulse 67   Ht 5\' 8"  (1.727 m)   Wt 224 lb (101.6 kg)   BMI 34.06 kg/m   Constitutional:  Alert and oriented, No acute distress. HEENT: [place] AT, moist mucus membranes.  Trachea midline, no masses. Cardiovascular: No clubbing, cyanosis, or edema. Respiratory: Normal respiratory effort, no increased work of breathing. GI: Abdomen is soft, nontender, nondistended, no abdominal masses, obese. GU: No CVA tenderness.  Neurologic: Grossly intact, no focal deficits, moving all 4 extremities. Psychiatric: Normal mood and affect.  Laboratory Data: Lab Results  Component Value Date   WBC 13.4 (H) 11/14/2017   HGB 11.4 (L) 11/14/2017   HCT 34.1 (L) 11/14/2017   MCV 91.8  11/14/2017   PLT 218 11/14/2017    Lab Results  Component Value Date   CREATININE 1.41 (H) 11/14/2017    Urinalysis Lab Results  Component Value Date   APPEARANCEUR CLEAR (A) 11/06/2017   LEUKOCYTESUR NEGATIVE 11/06/2017   PROTEINUR 30 (A) 11/06/2017   GLUCOSEU NEGATIVE 11/06/2017   RBCU 0-5 11/06/2017   BILIRUBINUR NEGATIVE 11/06/2017   NITRITE NEGATIVE 11/06/2017    Lab Results  Component Value Date   BACTERIA NONE SEEN 11/06/2017    Pertinent Imaging: Results for orders placed or performed in visit on 12/11/17  BLADDER SCAN AMB NON-IMAGING  Result Value Ref Range   Scan Result 11ml     Assessment & Plan:    1. Benign prostatic hyperplasia, unspecified whether lower urinary tract symptoms present Voiding extremely well status post TURP, emptying bladder well Stop Flomax continue finasteride Follow-up in 6 months for reevaluation, may consider stopping finasteride at that  point - BLADDER SCAN AMB NON-IMAGING  Return in about 6 months (around 06/11/2018) for IPSS/ PVR/ UA.  Hollice Espy, MD  [facility] [address] [place], [place] [address] [phone]

## 2018-01-21 ENCOUNTER — Encounter: Payer: Self-pay | Admitting: Internal Medicine

## 2018-01-21 ENCOUNTER — Ambulatory Visit (INDEPENDENT_AMBULATORY_CARE_PROVIDER_SITE_OTHER): Payer: Medicare Other | Admitting: Internal Medicine

## 2018-01-21 VITALS — BP 130/80 | HR 62 | Ht 68.0 in | Wt 228.0 lb

## 2018-01-21 DIAGNOSIS — Z950 Presence of cardiac pacemaker: Secondary | ICD-10-CM

## 2018-01-21 DIAGNOSIS — R55 Syncope and collapse: Secondary | ICD-10-CM | POA: Diagnosis not present

## 2018-01-21 DIAGNOSIS — I452 Bifascicular block: Secondary | ICD-10-CM

## 2018-01-21 LAB — CUP PACEART INCLINIC DEVICE CHECK
Battery Voltage: 3.2 V
Brady Statistic AP VP Percent: 0.07 %
Brady Statistic AS VP Percent: 0.04 %
Brady Statistic RA Percent Paced: 22.84 %
Date Time Interrogation Session: 20190129145947
Implantable Lead Implant Date: 20181024
Implantable Lead Location: 753859
Implantable Lead Location: 753860
Implantable Pulse Generator Implant Date: 20181024
Lead Channel Impedance Value: 437 Ohm
Lead Channel Pacing Threshold Amplitude: 0.75 V
Lead Channel Pacing Threshold Pulse Width: 1 ms
Lead Channel Sensing Intrinsic Amplitude: 4.375 mV
Lead Channel Setting Pacing Amplitude: 2.5 V
MDC IDC LEAD IMPLANT DT: 20181024
MDC IDC MSMT BATTERY REMAINING LONGEVITY: 176 mo
MDC IDC MSMT LEADCHNL RA IMPEDANCE VALUE: 342 Ohm
MDC IDC MSMT LEADCHNL RA IMPEDANCE VALUE: 551 Ohm
MDC IDC MSMT LEADCHNL RA PACING THRESHOLD PULSEWIDTH: 0.4 ms
MDC IDC MSMT LEADCHNL RV IMPEDANCE VALUE: 342 Ohm
MDC IDC MSMT LEADCHNL RV PACING THRESHOLD AMPLITUDE: 0.5 V
MDC IDC MSMT LEADCHNL RV SENSING INTR AMPL: 4 mV
MDC IDC SET LEADCHNL RA PACING AMPLITUDE: 2 V
MDC IDC SET LEADCHNL RV PACING PULSEWIDTH: 1 ms
MDC IDC SET LEADCHNL RV SENSING SENSITIVITY: 0.9 mV
MDC IDC STAT BRADY AP VS PERCENT: 22.41 %
MDC IDC STAT BRADY AS VS PERCENT: 77.48 %
MDC IDC STAT BRADY RV PERCENT PACED: 0.11 %

## 2018-01-21 NOTE — Patient Instructions (Signed)
Medication Instructions: - Your physician recommends that you continue on your current medications as directed. Please refer to the Current Medication list given to you today.  Labwork: - none ordered  Procedures/Testing: - none ordered  Follow-Up: - Remote monitoring is used to monitor your Pacemaker of ICD from home. This monitoring reduces the number of office visits required to check your device to one time per year. It allows [place] to keep an eye on the functioning of your device to ensure it is working properly. You are scheduled for a device check from home on 04/22/18. You may send your transmission at any time that day. If you have a wireless device, the transmission will be sent automatically. After your physician reviews your transmission, you will receive a postcard with your next transmission date.  - Your physician wants you to follow-up in: 1 year with Dr. Caryl Comes. You will receive a reminder letter in the mail two months in advance. If you don't receive a letter, please call our office to schedule the follow-up appointment.   Any Additional Special Instructions Will Be Listed Below (If Applicable).     If you need a refill on your cardiac medications before your next appointment, please call your pharmacy.

## 2018-01-21 NOTE — Progress Notes (Signed)
Patient Care Team: Maryland Pink, MD as PCP - General (Family Medicine)   HPI  Anthony Palmer is a 67 y.o. male Seen in followup for syncope and trifascicular block for which she underwent His bundle pacing with septal lead location 10/18.  He has had no interval syncope.  He has had brief episodes of lightheadedness that are ever so slight.  His device is programmed MVP and he has some degree of ventricular pacing suggesting intermittent heart block \ He has a history of hypertension diabetes chronic kidney disease.  Echocardiogram 10/18-normal LV function  He denies shortness of breath chest pain or peripheral edema.  There is no soreness in his device site     Past Medical History:  Diagnosis Date  . Arthritis    "hands, wrists" (10/16/2017)  . Bilateral hydronephrosis    a. 08/2017 in setting of bladder outlet obstruction.  . Bladder outlet obstruction    a. 08/2017 noted in setting of rising creatinine-->d/c'd w/ foley and plan for outpt urology f/u.  [name] Childhood asthma    "outgrew it"  . CKD (chronic kidney disease), stage III ([place])   . Gout    "on daily RX" (10/16/2017)  . Hyperlipidemia   . Hyperlipidemia   . Hypertension   . OSA on CPAP   . Presence of permanent cardiac pacemaker 10/16/2017  . Self-catheterizes urinary bladder   . Skin cancer    "back" (10/16/2017)  . Syncope    a. 08/2017 Echo: EF 60-65%, no rwma, Gr1 DD, nl RV fxn; b. 08/2017 carotid u/s: unremarkable.  . Trifascicular block   . Trigger finger, acquired   . Type II diabetes mellitus ([place])     Past Surgical History:  Procedure Laterality Date  . COLONOSCOPY WITH PROPOFOL N/A 07/23/2016   Procedure: COLONOSCOPY WITH PROPOFOL;  Surgeon: Lollie Sails, MD;  Location: [facility] ENDOSCOPY;  Service: Endoscopy;  Laterality: N/A;  . CYSTOSCOPY W/ RETROGRADES Bilateral 11/11/2017   Procedure: CYSTOSCOPY WITH RETROGRADE PYELOGRAM;  Surgeon: Hollice Espy, MD;  Location: [facility] ORS;  Service:  Urology;  Laterality: Bilateral;  . CYSTOSCOPY WITH FULGERATION N/A 11/13/2017   Procedure: CYSTOSCOPY WITH FULGERATION;  Surgeon: Hollice Espy, MD;  Location: [facility] ORS;  Service: Urology;  Laterality: N/A;  . INCISION AND DRAINAGE Left 2013   Hematoma left leg  . INSERT / REPLACE / REMOVE PACEMAKER  10/16/2017  . PACEMAKER IMPLANT N/A 10/16/2017   Procedure: PACEMAKER IMPLANT;  Surgeon: Deboraha Sprang, MD;  Location: [place] [facility];  Service: Cardiovascular;  Laterality: N/A;  . SKIN CANCER EXCISION     "off my back"  . TRANSURETHRAL RESECTION OF PROSTATE N/A 11/11/2017   Procedure: TRANSURETHRAL RESECTION OF THE PROSTATE (TURP);  Surgeon: Hollice Espy, MD;  Location: [facility] ORS;  Service: Urology;  Laterality: N/A;    Current Outpatient Medications  Medication Sig Dispense Refill  . allopurinol (ZYLOPRIM) 300 MG tablet Take 300 mg by mouth daily.    . finasteride (PROSCAR) 5 MG tablet Take 1 tablet (5 mg total) by mouth daily. 30 tablet 11  . glipiZIDE (GLUCOTROL XL) 2.5 MG 24 hr tablet Take 2.5 mg by mouth daily with breakfast.    . lovastatin (MEVACOR) 40 MG tablet Take 40 mg by mouth at bedtime.     No current facility-administered medications for this visit.     No Known Allergies    Review of Systems negative except from HPI and PMH  Physical Exam BP 130/80 (BP Location:  Left Arm, Patient Position: Sitting, Cuff Size: Normal)   Pulse 62   Ht 5\' 8"  (1.727 m)   Wt 228 lb (103.4 kg)   BMI 34.67 kg/m  Well developed and well nourished in no acute distress HENT normal E scleral and icterus clear Neck Supple Clear to ausculation Device pocket well healed; without hematoma or erythema.  There is no tethering Regular rate and rhythm, no murmurs gallops or rub Soft with active bowel sounds No clubbing cyanosis  Edema Alert and oriented, grossly normal motor and sensory function Skin Warm and Dry  ECG today demonstrates sinus rhythm at 62 Intervals 20/13/42    rIght bundle branch block with left axis deviation Left ventricular hypertrophy  Assessment and  Plan  Syncope-recurrent  Trifascicular block  Pacemaker-[employer]  Hypertension    The patient's blood pressure is reasonably controlled in the context of his diabetes  No interval syncope.  I suspect the lightheaded spells represent intermittent heart block and with the MVP mode to the one dropped beat might be sufficient to cause his symptoms.     Current medicines are reviewed at length with the patient today .  The patient does not  have concerns regarding medicines.

## 2018-04-16 ENCOUNTER — Other Ambulatory Visit: Payer: Self-pay

## 2018-04-18 LAB — HIV ANTIBODY (ROUTINE TESTING W REFLEX): HIV Screen 4th Generation wRfx: NONREACTIVE

## 2018-04-22 ENCOUNTER — Telehealth: Payer: Self-pay | Admitting: Cardiology

## 2018-04-22 ENCOUNTER — Ambulatory Visit (INDEPENDENT_AMBULATORY_CARE_PROVIDER_SITE_OTHER): Payer: Medicare Other | Admitting: *Deleted

## 2018-04-22 DIAGNOSIS — I452 Bifascicular block: Secondary | ICD-10-CM

## 2018-04-22 NOTE — Telephone Encounter (Signed)
LMOVM reminding pt to send remote transmission.   

## 2018-04-23 ENCOUNTER — Encounter: Payer: Self-pay | Admitting: Cardiology

## 2018-04-23 LAB — CUP PACEART REMOTE DEVICE CHECK
Battery Remaining Longevity: 173 mo
Battery Voltage: 3.17 V
Brady Statistic AS VP Percent: 0.03 %
Brady Statistic RA Percent Paced: 24.7 %
Date Time Interrogation Session: 20190430231039
Implantable Lead Implant Date: 20181024
Implantable Lead Location: 753859
Implantable Lead Model: 3830
Implantable Pulse Generator Implant Date: 20181024
Lead Channel Impedance Value: 589 Ohm
Lead Channel Pacing Threshold Amplitude: 0.625 V
Lead Channel Pacing Threshold Pulse Width: 0.4 ms
Lead Channel Pacing Threshold Pulse Width: 0.4 ms
Lead Channel Sensing Intrinsic Amplitude: 3.75 mV
Lead Channel Sensing Intrinsic Amplitude: 4.125 mV
Lead Channel Setting Pacing Pulse Width: 1 ms
Lead Channel Setting Sensing Sensitivity: 0.9 mV
MDC IDC LEAD IMPLANT DT: 20181024
MDC IDC LEAD LOCATION: 753860
MDC IDC MSMT LEADCHNL RA IMPEDANCE VALUE: 418 Ohm
MDC IDC MSMT LEADCHNL RA SENSING INTR AMPL: 3.75 mV
MDC IDC MSMT LEADCHNL RV IMPEDANCE VALUE: 380 Ohm
MDC IDC MSMT LEADCHNL RV IMPEDANCE VALUE: 456 Ohm
MDC IDC MSMT LEADCHNL RV PACING THRESHOLD AMPLITUDE: 0.875 V
MDC IDC MSMT LEADCHNL RV SENSING INTR AMPL: 4.125 mV
MDC IDC SET LEADCHNL RA PACING AMPLITUDE: 2 V
MDC IDC SET LEADCHNL RV PACING AMPLITUDE: 2.5 V
MDC IDC STAT BRADY AP VP PERCENT: 0.05 %
MDC IDC STAT BRADY AP VS PERCENT: 24.58 %
MDC IDC STAT BRADY AS VS PERCENT: 75.34 %
MDC IDC STAT BRADY RV PERCENT PACED: 0.07 %

## 2018-04-23 NOTE — Progress Notes (Signed)
Remote pacemaker transmission.   

## 2018-06-11 ENCOUNTER — Ambulatory Visit: Payer: Medicare Other | Admitting: Urology

## 2018-06-30 ENCOUNTER — Other Ambulatory Visit: Payer: Self-pay

## 2018-06-30 DIAGNOSIS — N1330 Unspecified hydronephrosis: Secondary | ICD-10-CM

## 2018-06-30 MED ORDER — FINASTERIDE 5 MG PO TABS: 5.0000 mg | ORAL_TABLET | Freq: Every day | ORAL | 1 refills | Status: DC

## 2018-07-18 ENCOUNTER — Telehealth: Payer: Self-pay | Admitting: Urology

## 2018-07-18 DIAGNOSIS — N1330 Unspecified hydronephrosis: Secondary | ICD-10-CM

## 2018-07-18 MED ORDER — FINASTERIDE 5 MG PO TABS: 5.0000 mg | ORAL_TABLET | Freq: Every day | ORAL | 0 refills | Status: DC

## 2018-07-18 NOTE — Addendum Note (Signed)
Addended by: Kyra Manges on: 07/18/2018 02:44 PM   Modules accepted: Orders

## 2018-07-18 NOTE — Telephone Encounter (Signed)
Pt has appt on 8/13 and will run of of Finasteride today.  Can we call in enough to get him through til appt?  He has changed pharmacies to [facility] [address]. Please give pt a call and let him know when this is done. [phone]

## 2018-07-18 NOTE — Telephone Encounter (Signed)
RX sent, patient notified.

## 2018-07-22 ENCOUNTER — Ambulatory Visit (INDEPENDENT_AMBULATORY_CARE_PROVIDER_SITE_OTHER): Payer: Medicare Other | Admitting: *Deleted

## 2018-07-22 DIAGNOSIS — R55 Syncope and collapse: Secondary | ICD-10-CM | POA: Diagnosis not present

## 2018-07-22 NOTE — Progress Notes (Signed)
Remote pacemaker transmission.   

## 2018-07-23 ENCOUNTER — Encounter: Payer: Self-pay | Admitting: Cardiology

## 2018-08-04 LAB — CUP PACEART REMOTE DEVICE CHECK
Battery Remaining Longevity: 170 mo
Brady Statistic RA Percent Paced: 15.94 %
Date Time Interrogation Session: 20190730105722
Implantable Lead Implant Date: 20181024
Implantable Lead Implant Date: 20181024
Implantable Lead Location: 753859
Implantable Lead Model: 3830
Implantable Lead Model: 5076
Lead Channel Impedance Value: 380 Ohm
Lead Channel Impedance Value: 437 Ohm
Lead Channel Pacing Threshold Pulse Width: 0.4 ms
Lead Channel Sensing Intrinsic Amplitude: 4.375 mV
Lead Channel Sensing Intrinsic Amplitude: 4.5 mV
Lead Channel Sensing Intrinsic Amplitude: 4.5 mV
Lead Channel Setting Pacing Pulse Width: 1 ms
Lead Channel Setting Sensing Sensitivity: 0.9 mV
MDC IDC LEAD LOCATION: 753860
MDC IDC MSMT BATTERY VOLTAGE: 3.14 V
MDC IDC MSMT LEADCHNL RA IMPEDANCE VALUE: 456 Ohm
MDC IDC MSMT LEADCHNL RA PACING THRESHOLD AMPLITUDE: 0.5 V
MDC IDC MSMT LEADCHNL RV IMPEDANCE VALUE: 361 Ohm
MDC IDC MSMT LEADCHNL RV PACING THRESHOLD AMPLITUDE: 1.125 V
MDC IDC MSMT LEADCHNL RV PACING THRESHOLD PULSEWIDTH: 0.4 ms
MDC IDC MSMT LEADCHNL RV SENSING INTR AMPL: 4.375 mV
MDC IDC PG IMPLANT DT: 20181024
MDC IDC SET LEADCHNL RA PACING AMPLITUDE: 2 V
MDC IDC SET LEADCHNL RV PACING AMPLITUDE: 2.5 V
MDC IDC STAT BRADY AP VP PERCENT: 0.03 %
MDC IDC STAT BRADY AP VS PERCENT: 15.61 %
MDC IDC STAT BRADY AS VP PERCENT: 0.03 %
MDC IDC STAT BRADY AS VS PERCENT: 84.34 %
MDC IDC STAT BRADY RV PERCENT PACED: 0.06 %

## 2018-08-07 ENCOUNTER — Encounter: Payer: Self-pay | Admitting: Urology

## 2018-08-07 ENCOUNTER — Ambulatory Visit (INDEPENDENT_AMBULATORY_CARE_PROVIDER_SITE_OTHER): Payer: Medicare Other | Admitting: Urology

## 2018-08-07 VITALS — BP 147/93 | HR 79 | Ht 67.0 in | Wt 228.0 lb

## 2018-08-07 DIAGNOSIS — N401 Enlarged prostate with lower urinary tract symptoms: Secondary | ICD-10-CM | POA: Diagnosis not present

## 2018-08-07 DIAGNOSIS — N183 Chronic kidney disease, stage 3 unspecified: Secondary | ICD-10-CM

## 2018-08-07 DIAGNOSIS — N138 Other obstructive and reflux uropathy: Secondary | ICD-10-CM | POA: Diagnosis not present

## 2018-08-07 DIAGNOSIS — Z87898 Personal history of other specified conditions: Secondary | ICD-10-CM

## 2018-08-07 LAB — BLADDER SCAN AMB NON-IMAGING

## 2018-08-07 NOTE — Progress Notes (Signed)
08/07/2018 9:34 AM   Anthony Palmer [birth year] [id number]  Referring provider: Maryland Pink, MD [address] [facility] [place], [place] [address]  Chief Complaint  Patient presents with  . Benign Prostatic Hypertrophy    93month    HPI: 67 year old male with a history of BPH and urinary retention who returns today for six-month follow-up.  He underwent TURP on 10/2017.  Course was complicated by Foley catheter trauma and severe gross hematuria requiring return to the OR.  Ultimately this resolved and is done well.  Surgical pathology was consistent with benign prostatic tissue with glandular and stromal hyperplasia, nonspecific inflammation and benign urothelial mucosa.  17 g of tissue were resected  Most recent PSA 0.31 on 07/28/2018.     IPSS    Row Name 08/07/18 0900         International Prostate Symptom Score   How often have you had the sensation of not emptying your bladder?  Not at All     How often have you had to urinate less than every two hours?  Not at All     How often have you found you stopped and started again several times when you urinated?  Not at All     How often have you found it difficult to postpone urination?  Less than 1 in 5 times     How often have you had a weak urinary stream?  Less than 1 in 5 times     How often have you had to strain to start urination?  Not at All     How many times did you typically get up at night to urinate?  1 Time     Total IPSS Score  3       Quality of Life due to urinary symptoms   If you were to spend the rest of your life with your urinary condition just the way it is now how would you feel about that?  Pleased        Score:  1-7 Mild 8-19 Moderate 20-35 Severe   PMH: Past Medical History:  Diagnosis Date  . Arthritis    "hands, wrists" (10/16/2017)  . Bilateral hydronephrosis    a. 08/2017 in setting of bladder outlet obstruction.  . Bladder outlet obstruction    a. 08/2017 noted in  setting of rising creatinine-->d/c'd w/ foley and plan for outpt urology f/u.  [name] Childhood asthma    "outgrew it"  . CKD (chronic kidney disease), stage III ([place])   . Gout    "on daily RX" (10/16/2017)  . Hyperlipidemia   . Hyperlipidemia   . Hypertension   . OSA on CPAP   . Presence of permanent cardiac pacemaker 10/16/2017  . Self-catheterizes urinary bladder   . Skin cancer    "back" (10/16/2017)  . Syncope    a. 08/2017 Echo: EF 60-65%, no rwma, Gr1 DD, nl RV fxn; b. 08/2017 carotid u/s: unremarkable.  . Trifascicular block   . Trigger finger, acquired   . Type II diabetes mellitus ([place])     Surgical History: Past Surgical History:  Procedure Laterality Date  . COLONOSCOPY WITH PROPOFOL N/A 07/23/2016   Procedure: COLONOSCOPY WITH PROPOFOL;  Surgeon: Lollie Sails, MD;  Location: [facility] ENDOSCOPY;  Service: Endoscopy;  Laterality: N/A;  . CYSTOSCOPY W/ RETROGRADES Bilateral 11/11/2017   Procedure: CYSTOSCOPY WITH RETROGRADE PYELOGRAM;  Surgeon: Hollice Espy, MD;  Location: [facility] ORS;  Service: Urology;  Laterality: Bilateral;  .  CYSTOSCOPY WITH FULGERATION N/A 11/13/2017   Procedure: CYSTOSCOPY WITH FULGERATION;  Surgeon: Hollice Espy, MD;  Location: [facility] ORS;  Service: Urology;  Laterality: N/A;  . INCISION AND DRAINAGE Left 2013   Hematoma left leg  . INSERT / REPLACE / REMOVE PACEMAKER  10/16/2017  . PACEMAKER IMPLANT N/A 10/16/2017   Procedure: PACEMAKER IMPLANT;  Surgeon: Deboraha Sprang, MD;  Location: [place] CV LAB;  Service: Cardiovascular;  Laterality: N/A;  . SKIN CANCER EXCISION     "off my back"  . TRANSURETHRAL RESECTION OF PROSTATE N/A 11/11/2017   Procedure: TRANSURETHRAL RESECTION OF THE PROSTATE (TURP);  Surgeon: Hollice Espy, MD;  Location: [facility] ORS;  Service: Urology;  Laterality: N/A;    Home Medications:  Allergies as of 08/07/2018   No Known Allergies     Medication List        Accurate as of 08/07/18  9:34 AM. Always use your most  recent med list.          allopurinol 300 MG tablet Commonly known as:  ZYLOPRIM Take 300 mg by mouth daily.   glipiZIDE 2.5 MG 24 hr tablet Commonly known as:  GLUCOTROL XL Take 2.5 mg by mouth daily with breakfast.   lovastatin 40 MG tablet Commonly known as:  MEVACOR Take 40 mg by mouth at bedtime.       Allergies: No Known Allergies  Family History: Family History  Problem Relation Age of Onset  . Heart disease Mother   . Heart attack Mother   . Heart disease Father   . Heart attack Father   . Stroke Father     Social History:  reports that he has never smoked. He has never used smokeless tobacco. He reports that he does not drink alcohol or use drugs.  ROS: UROLOGY Frequent Urination?: No Hard to postpone urination?: No Burning/pain with urination?: No Get up at night to urinate?: No Leakage of urine?: No Urine stream starts and stops?: No Trouble starting stream?: No Do you have to strain to urinate?: No Blood in urine?: No Urinary tract infection?: No Sexually transmitted disease?: No Injury to kidneys or bladder?: No Painful intercourse?: No Weak stream?: No Erection problems?: No Penile pain?: No  Gastrointestinal Nausea?: No Vomiting?: No Indigestion/heartburn?: No Diarrhea?: No Constipation?: No  Constitutional Fever: No Night sweats?: No Weight loss?: No Fatigue?: No  Skin Skin rash/lesions?: No Itching?: No  Eyes Blurred vision?: No Double vision?: No  Ears/Nose/Throat Sore throat?: No Sinus problems?: No  Hematologic/Lymphatic Swollen glands?: No Easy bruising?: No  Cardiovascular Leg swelling?: No Chest pain?: No  Respiratory Cough?: No Shortness of breath?: No  Endocrine Excessive thirst?: No  Musculoskeletal Back pain?: No Joint pain?: No  Neurological Headaches?: No Dizziness?: No  Psychologic Depression?: No Anxiety?: No  Physical Exam: BP (!) 147/93   Pulse 79   Ht 5\' 7"  (1.702 m)   Wt 228  lb (103.4 kg)   BMI 35.71 kg/m   Constitutional:  Alert and oriented, No acute distress. HEENT: [place] AT, moist mucus membranes.  Trachea midline, no masses. Cardiovascular: No clubbing, cyanosis, or edema. Respiratory: Normal respiratory effort, no increased work of breathing. Rectal: Normal sphincter tone.  Mild enlarged prostate, no nodules, nontender.. Skin: No rashes, bruises or suspicious lesions. Neurologic: Grossly intact, no focal deficits, moving all 4 extremities. Psychiatric: Normal mood and affect.  Laboratory Data:   Urinalysis    Component Value Date/Time   COLORURINE YELLOW (A) 11/06/2017 1129   APPEARANCEUR CLEAR (A)  11/06/2017 1129   LABSPEC 1.012 11/06/2017 1129   PHURINE 6.0 11/06/2017 [place] 11/06/2017 1129   HGBUR NEGATIVE 11/06/2017 [place] 11/06/2017 [place] 11/06/2017 1129   PROTEINUR 30 (A) 11/06/2017 1129   NITRITE NEGATIVE 11/06/2017 1129   LEUKOCYTESUR NEGATIVE 11/06/2017 1129    Lab Results  Component Value Date   BACTERIA NONE SEEN 11/06/2017   Cr 1.3 on 07/2018  Pertinent Imaging: Results for orders placed or performed in visit on 08/07/18  BLADDER SCAN AMB NON-IMAGING  Result Value Ref Range   Scan Result 74ml     Assessment & Plan:    1. BPH with urinary obstruction Personal history of BPH with urinary retention Status post TURP with excellent results Okay to discontinue finasteride, previously discontinued Flomax Advised to call if his urinary symptoms worsen and we can resume 1 of these 2 medications as needed Recheck in 1 year again, if doing well can discharge from urologic care PSA/rectal exam screening up-to-date - BLADDER SCAN AMB NON-IMAGING  2. History of urinary retention Postvoid residual minimal today  3. CKD (chronic kidney disease), stage III (HCC) Creatinine back down to his baseline, 1.3 following resolution of obstructive nephropathy   Return in about 1  year (around 08/08/2019) for IPSS/ PVR.  Hollice Espy, MD  [facility] [address], [place], [place] [address] [phone]

## 2018-10-20 NOTE — Progress Notes (Signed)
Patient Care Team: Maryland Pink, MD as PCP - General (Family Medicine)   HPI  Anthony Palmer is a 67 y.o. male Seen in followup for syncope and trifascicular block for which he underwent His bundle pacing with septal lead location 10/18.  No interval syncope  The patient denies chest pain, shortness of breath, nocturnal dyspnea, orthopnea but has mild peripheral edema.  There have been no palpitations, lightheadedness or syncope.    He has a history of hypertension diabetes chronic kidney disease.  Echocardiogram 10/18-normal LV function  Date Cr K  HgbA1c  10/18 1.8 5.2 6.1  8/19 1.3 3.8 7.3       Past Medical History:  Diagnosis Date  . Arthritis    "hands, wrists" (10/16/2017)  . Bilateral hydronephrosis    a. 08/2017 in setting of bladder outlet obstruction.  . Bladder outlet obstruction    a. 08/2017 noted in setting of rising creatinine-->d/c'd w/ foley and plan for outpt urology f/u.  [name] Childhood asthma    "outgrew it"  . CKD (chronic kidney disease), stage III ([place])   . Gout    "on daily RX" (10/16/2017)  . Hyperlipidemia   . Hyperlipidemia   . Hypertension   . OSA on CPAP   . Presence of permanent cardiac pacemaker 10/16/2017  . Self-catheterizes urinary bladder   . Skin cancer    "back" (10/16/2017)  . Syncope    a. 08/2017 Echo: EF 60-65%, no rwma, Gr1 DD, nl RV fxn; b. 08/2017 carotid u/s: unremarkable.  . Trifascicular block   . Trigger finger, acquired   . Type II diabetes mellitus ([place])     Past Surgical History:  Procedure Laterality Date  . COLONOSCOPY WITH PROPOFOL N/A 07/23/2016   Procedure: COLONOSCOPY WITH PROPOFOL;  Surgeon: Lollie Sails, MD;  Location: [facility] ENDOSCOPY;  Service: Endoscopy;  Laterality: N/A;  . CYSTOSCOPY W/ RETROGRADES Bilateral 11/11/2017   Procedure: CYSTOSCOPY WITH RETROGRADE PYELOGRAM;  Surgeon: Hollice Espy, MD;  Location: [facility] ORS;  Service: Urology;  Laterality: Bilateral;  . CYSTOSCOPY WITH  FULGERATION N/A 11/13/2017   Procedure: CYSTOSCOPY WITH FULGERATION;  Surgeon: Hollice Espy, MD;  Location: [facility] ORS;  Service: Urology;  Laterality: N/A;  . INCISION AND DRAINAGE Left 2013   Hematoma left leg  . INSERT / REPLACE / REMOVE PACEMAKER  10/16/2017  . PACEMAKER IMPLANT N/A 10/16/2017   Procedure: PACEMAKER IMPLANT;  Surgeon: Deboraha Sprang, MD;  Location: [place] CV LAB;  Service: Cardiovascular;  Laterality: N/A;  . SKIN CANCER EXCISION     "off my back"  . TRANSURETHRAL RESECTION OF PROSTATE N/A 11/11/2017   Procedure: TRANSURETHRAL RESECTION OF THE PROSTATE (TURP);  Surgeon: Hollice Espy, MD;  Location: [facility] ORS;  Service: Urology;  Laterality: N/A;    Current Outpatient Medications  Medication Sig Dispense Refill  . allopurinol (ZYLOPRIM) 300 MG tablet Take 300 mg by mouth daily.    . chlorthalidone (HYGROTON) 25 MG tablet TAKE 1 TABLET BY MOUTH EVERY DAY IN THE MORNING  11  . glipiZIDE (GLUCOTROL XL) 2.5 MG 24 hr tablet Take 2.5 mg by mouth daily with breakfast.    . lovastatin (MEVACOR) 40 MG tablet Take 40 mg by mouth at bedtime.     No current facility-administered medications for this visit.     No Known Allergies    Review of Systems negative except from HPI and PMH  Physical Exam BP 134/80 (BP Location: Left Arm, Patient Position: Sitting, Cuff Size: Normal)  Pulse 69   Ht 5\' 7"  (1.702 m)   Wt 232 lb 8 oz (105.5 kg)   BMI 36.41 kg/m  Well developed and nourished in no acute distress HENT normal Neck supple with JVP-flat Clear Device pocket well healed; without hematoma or erythema.  There is no tethering  Regular rate and rhythm, no murmurs or gallops Abd-soft with active BS No Clubbing cyanosis edema Skin-warm and dry A & Oriented  Grossly normal sensory and motor function  ECG today sinus @ 69 21/15/43 RBBB LAFB  Assessment and  Plan  Syncope-recurrent  Trifascicular block  Pacemaker-[employer] The patient's device was  interrogated.  The information was reviewed. No changes were made in the programming.     Hypertension  Diabetes  BP well controlled   No interval syncope  HgbA1c increasingly elevated and have encouraged diet and exercise

## 2018-10-21 ENCOUNTER — Encounter: Payer: Self-pay | Admitting: Internal Medicine

## 2018-10-21 ENCOUNTER — Ambulatory Visit (INDEPENDENT_AMBULATORY_CARE_PROVIDER_SITE_OTHER): Payer: Medicare Other | Admitting: Internal Medicine

## 2018-10-21 ENCOUNTER — Ambulatory Visit (INDEPENDENT_AMBULATORY_CARE_PROVIDER_SITE_OTHER): Payer: Medicare Other | Admitting: *Deleted

## 2018-10-21 VITALS — BP 134/80 | HR 69 | Ht 67.0 in | Wt 232.5 lb

## 2018-10-21 DIAGNOSIS — Z950 Presence of cardiac pacemaker: Secondary | ICD-10-CM

## 2018-10-21 DIAGNOSIS — R55 Syncope and collapse: Secondary | ICD-10-CM

## 2018-10-21 DIAGNOSIS — I453 Trifascicular block: Secondary | ICD-10-CM | POA: Diagnosis not present

## 2018-10-21 LAB — CUP PACEART INCLINIC DEVICE CHECK
Brady Statistic AP VP Percent: 0.03 %
Brady Statistic AP VS Percent: 19.81 %
Brady Statistic AS VP Percent: 0.03 %
Brady Statistic AS VS Percent: 80.13 %
Brady Statistic RV Percent Paced: 0.06 %
Date Time Interrogation Session: 20191029141825
Implantable Lead Location: 753860
Implantable Lead Model: 3830
Implantable Lead Model: 5076
Lead Channel Impedance Value: 361 Ohm
Lead Channel Impedance Value: 380 Ohm
Lead Channel Pacing Threshold Amplitude: 0.5 V
Lead Channel Sensing Intrinsic Amplitude: 4.125 mV
Lead Channel Sensing Intrinsic Amplitude: 4.5 mV
Lead Channel Setting Pacing Amplitude: 2 V
Lead Channel Setting Pacing Amplitude: 2.5 V
Lead Channel Setting Pacing Pulse Width: 1 ms
Lead Channel Setting Sensing Sensitivity: 0.9 mV
MDC IDC LEAD IMPLANT DT: 20181024
MDC IDC LEAD IMPLANT DT: 20181024
MDC IDC LEAD LOCATION: 753859
MDC IDC MSMT BATTERY REMAINING LONGEVITY: 167 mo
MDC IDC MSMT BATTERY VOLTAGE: 3.1 V
MDC IDC MSMT LEADCHNL RA IMPEDANCE VALUE: 456 Ohm
MDC IDC MSMT LEADCHNL RA PACING THRESHOLD AMPLITUDE: 0.5 V
MDC IDC MSMT LEADCHNL RA PACING THRESHOLD PULSEWIDTH: 0.4 ms
MDC IDC MSMT LEADCHNL RV IMPEDANCE VALUE: 456 Ohm
MDC IDC MSMT LEADCHNL RV PACING THRESHOLD PULSEWIDTH: 0.4 ms
MDC IDC PG IMPLANT DT: 20181024
MDC IDC STAT BRADY RA PERCENT PACED: 20.02 %

## 2018-10-21 NOTE — Progress Notes (Signed)
Remote pacemaker transmission.   

## 2018-10-21 NOTE — Patient Instructions (Signed)
Medication Instructions:  - Your physician recommends that you continue on your current medications as directed. Please refer to the Current Medication list given to you today.  If you need a refill on your cardiac medications before your next appointment, please call your pharmacy.   Lab work: - none ordered  If you have labs (blood work) drawn today and your tests are completely normal, you will receive your results only by: [name] MyChart Message (if you have MyChart) OR . A paper copy in the mail If you have any lab test that is abnormal or we need to change your treatment, we will call you to review the results.  Testing/Procedures: - none ordered  Follow-Up: At [facility], you and your health needs are our priority.  As part of our continuing mission to provide you with exceptional heart care, we have created designated Provider Care Teams.  These Care Teams include your primary Cardiologist (physician) and Advanced Practice Providers (APPs -  Physician Assistants and Nurse Practitioners) who all work together to provide you with the care you need, when you need it. . You will need a follow up appointment in 1 year with Dr. Caryl Comes.  Please call our office 2 months in advance to schedule this appointment.    Remote monitoring is used to monitor your Pacemaker of ICD from home. This monitoring reduces the number of office visits required to check your device to one time per year. It allows [place] to keep an eye on the functioning of your device to ensure it is working properly. You are scheduled for a device check from home on 01/20/19. You may send your transmission at any time that day. If you have a wireless device, the transmission will be sent automatically. After your physician reviews your transmission, you will receive a postcard with your next transmission date.  Any Other Special Instructions Will Be Listed Below (If Applicable). - N/A

## 2018-12-21 LAB — CUP PACEART REMOTE DEVICE CHECK
Battery Remaining Longevity: 167 mo
Battery Voltage: 3.1 V
Brady Statistic AP VP Percent: 0.02 %
Brady Statistic AP VS Percent: 19.75 %
Brady Statistic AS VP Percent: 0.03 %
Brady Statistic AS VS Percent: 80.19 %
Brady Statistic RA Percent Paced: 19.93 %
Brady Statistic RV Percent Paced: 0.05 %
Date Time Interrogation Session: 20191029044331
Implantable Lead Implant Date: 20181024
Implantable Lead Model: 5076
Implantable Pulse Generator Implant Date: 20181024
Lead Channel Impedance Value: 361 Ohm
Lead Channel Impedance Value: 456 Ohm
Lead Channel Pacing Threshold Amplitude: 1.125 V
Lead Channel Pacing Threshold Pulse Width: 0.4 ms
Lead Channel Sensing Intrinsic Amplitude: 4.125 mV
Lead Channel Setting Pacing Amplitude: 2 V
Lead Channel Setting Pacing Pulse Width: 1 ms
MDC IDC LEAD IMPLANT DT: 20181024
MDC IDC LEAD LOCATION: 753859
MDC IDC LEAD LOCATION: 753860
MDC IDC MSMT LEADCHNL RA IMPEDANCE VALUE: 399 Ohm
MDC IDC MSMT LEADCHNL RA IMPEDANCE VALUE: 456 Ohm
MDC IDC MSMT LEADCHNL RA PACING THRESHOLD AMPLITUDE: 0.5 V
MDC IDC MSMT LEADCHNL RA SENSING INTR AMPL: 4.75 mV
MDC IDC MSMT LEADCHNL RA SENSING INTR AMPL: 4.75 mV
MDC IDC MSMT LEADCHNL RV PACING THRESHOLD PULSEWIDTH: 0.4 ms
MDC IDC MSMT LEADCHNL RV SENSING INTR AMPL: 4.125 mV
MDC IDC SET LEADCHNL RV PACING AMPLITUDE: 2.5 V
MDC IDC SET LEADCHNL RV SENSING SENSITIVITY: 0.9 mV

## 2018-12-22 DIAGNOSIS — D239 Other benign neoplasm of skin, unspecified: Secondary | ICD-10-CM

## 2018-12-22 HISTORY — DX: Other benign neoplasm of skin, unspecified: D23.9

## 2019-01-20 ENCOUNTER — Ambulatory Visit (INDEPENDENT_AMBULATORY_CARE_PROVIDER_SITE_OTHER): Payer: Medicare Other

## 2019-01-20 DIAGNOSIS — R001 Bradycardia, unspecified: Secondary | ICD-10-CM

## 2019-01-20 DIAGNOSIS — R55 Syncope and collapse: Secondary | ICD-10-CM

## 2019-01-21 NOTE — Progress Notes (Signed)
Remote pacemaker transmission.   

## 2019-01-22 LAB — CUP PACEART REMOTE DEVICE CHECK
Battery Remaining Longevity: 164 mo
Battery Voltage: 3.06 V
Brady Statistic AP VS Percent: 18.86 %
Brady Statistic AS VS Percent: 81.09 %
Brady Statistic RV Percent Paced: 0.05 %
Implantable Lead Implant Date: 20181024
Implantable Lead Implant Date: 20181024
Implantable Lead Location: 753860
Implantable Pulse Generator Implant Date: 20181024
Lead Channel Impedance Value: 361 Ohm
Lead Channel Impedance Value: 399 Ohm
Lead Channel Impedance Value: 437 Ohm
Lead Channel Impedance Value: 456 Ohm
Lead Channel Pacing Threshold Amplitude: 0.375 V
Lead Channel Pacing Threshold Amplitude: 1.25 V
Lead Channel Pacing Threshold Pulse Width: 0.4 ms
Lead Channel Sensing Intrinsic Amplitude: 4.125 mV
Lead Channel Sensing Intrinsic Amplitude: 4.125 mV
Lead Channel Sensing Intrinsic Amplitude: 5.125 mV
Lead Channel Setting Pacing Amplitude: 2.5 V
Lead Channel Setting Sensing Sensitivity: 0.9 mV
MDC IDC LEAD LOCATION: 753859
MDC IDC MSMT LEADCHNL RA PACING THRESHOLD PULSEWIDTH: 0.4 ms
MDC IDC MSMT LEADCHNL RV SENSING INTR AMPL: 5.125 mV
MDC IDC SESS DTM: 20200128142627
MDC IDC SET LEADCHNL RA PACING AMPLITUDE: 2 V
MDC IDC SET LEADCHNL RV PACING PULSEWIDTH: 1 ms
MDC IDC STAT BRADY AP VP PERCENT: 0.01 %
MDC IDC STAT BRADY AS VP PERCENT: 0.04 %
MDC IDC STAT BRADY RA PERCENT PACED: 18.98 %

## 2019-02-26 IMAGING — US US CAROTID DUPLEX BILAT
1 series · 13 of 24 positions shown · non-contrast
Comparison: None.

CLINICAL DATA: Syncopal episode. History of hypertension and
diabetes.

EXAM:
BILATERAL CAROTID DUPLEX ULTRASOUND
TECHNIQUE: Gray scale imaging, color Doppler and duplex ultrasound were
performed of bilateral carotid and vertebral arteries in the neck.

[Series 1: us carotid duplex bilat · 13 of 48 slices shown]
[im 1/48]
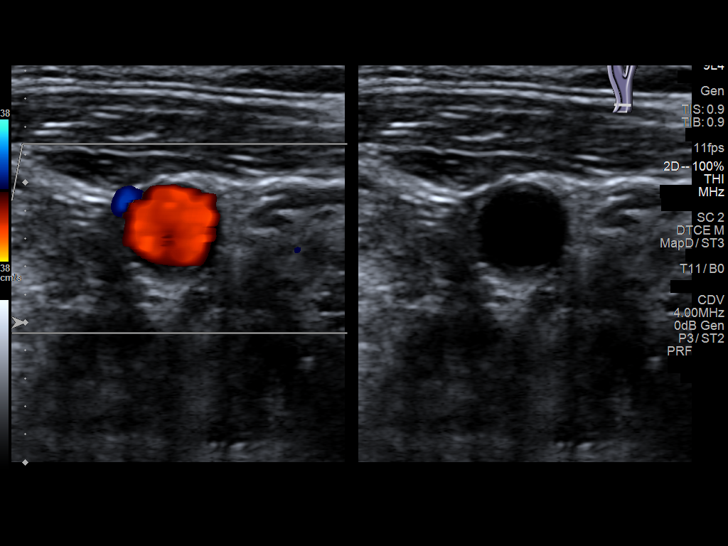
[im 5/48]
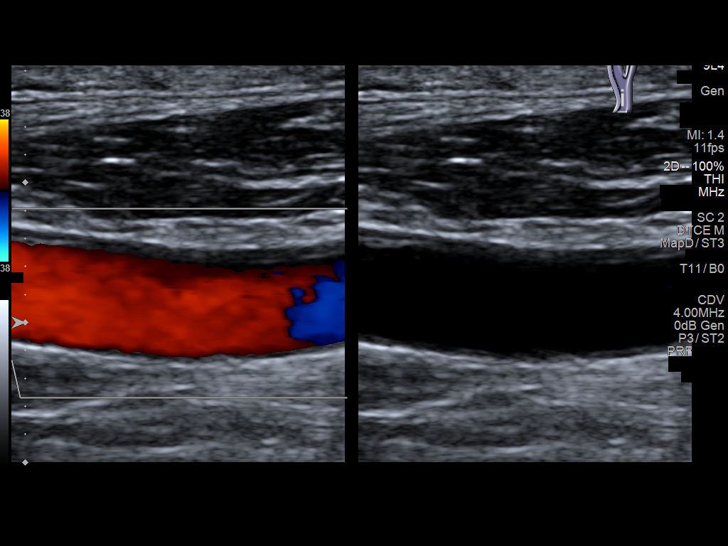
[im 9/48]
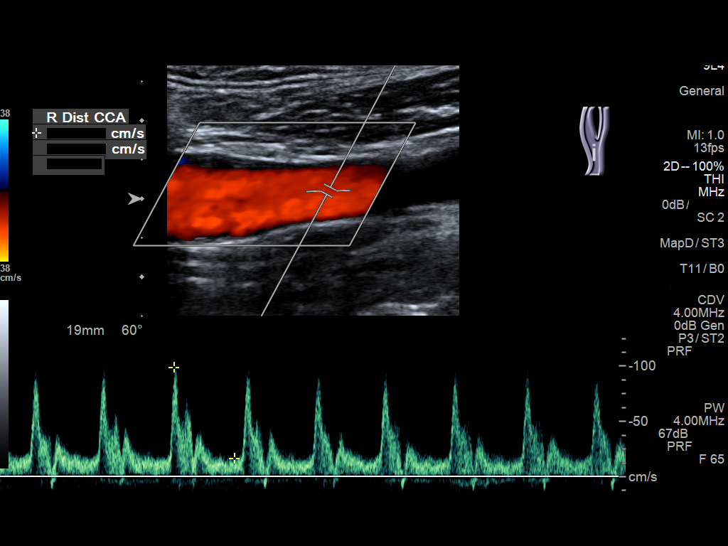
[im 13/48]
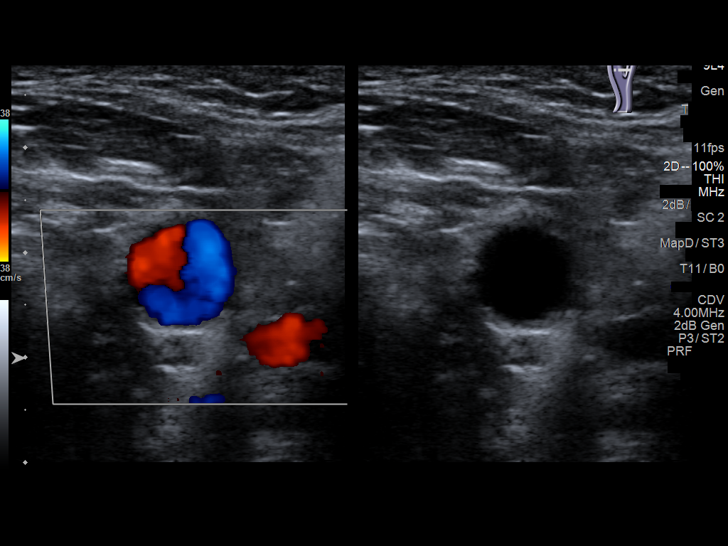
[im 17/48]
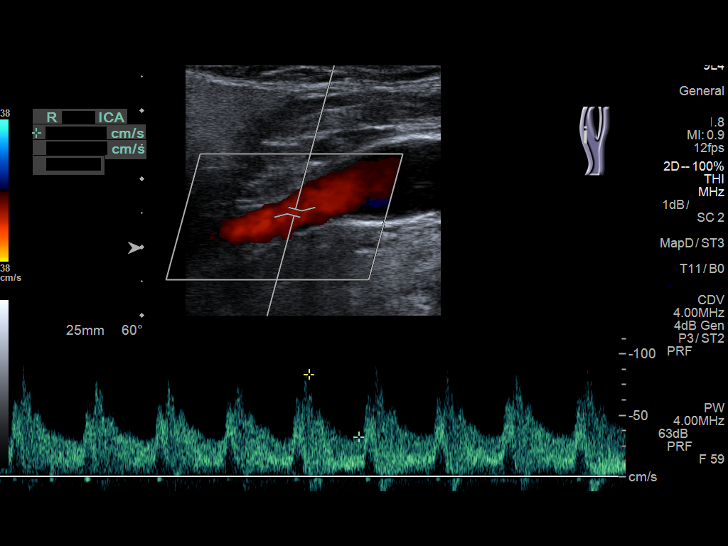
[im 21/48]
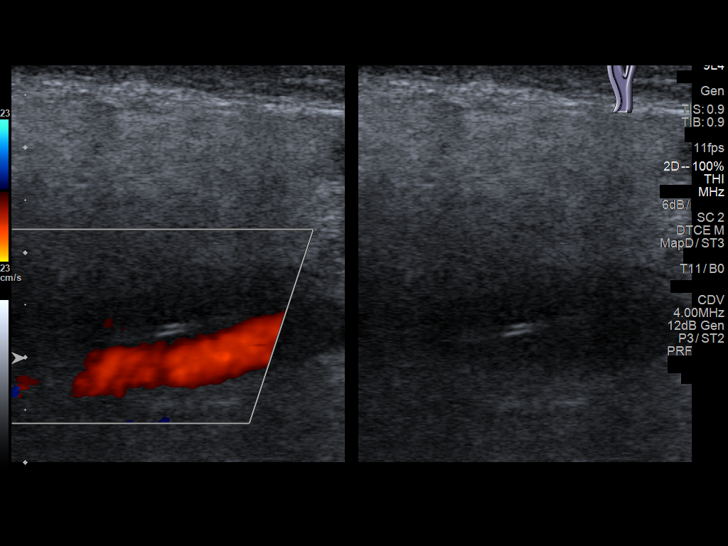
[im 25/48]
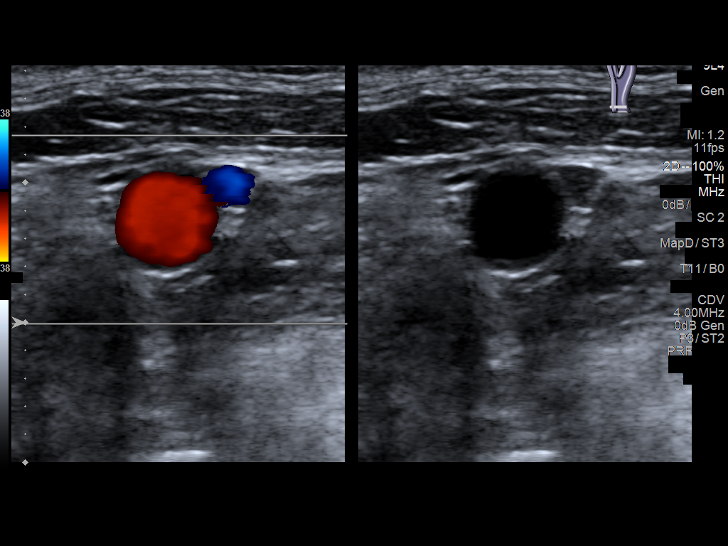
[im 27/48]
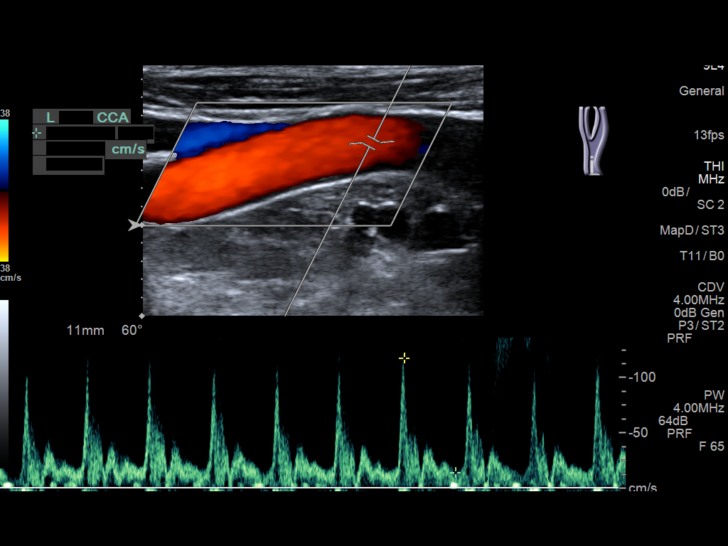
[im 31/48]
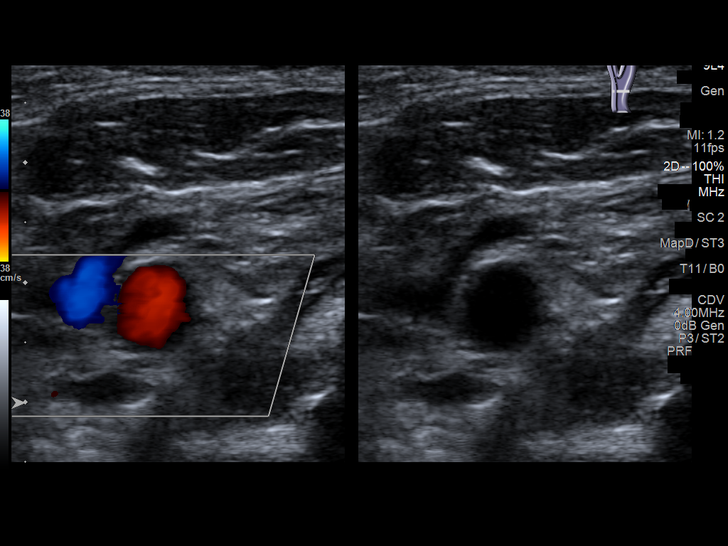
[im 35/48]
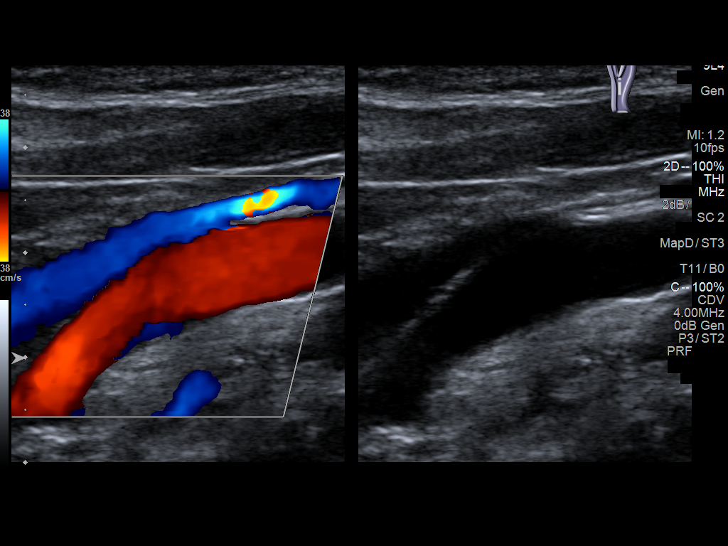
[im 39/48]
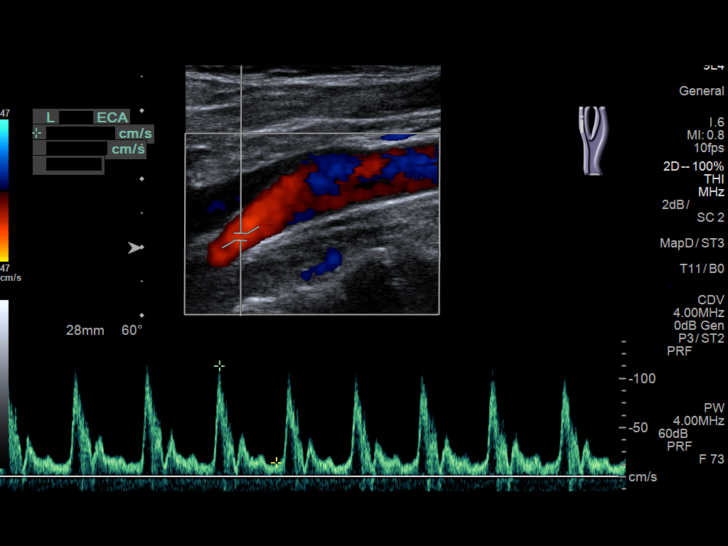
[im 43/48]
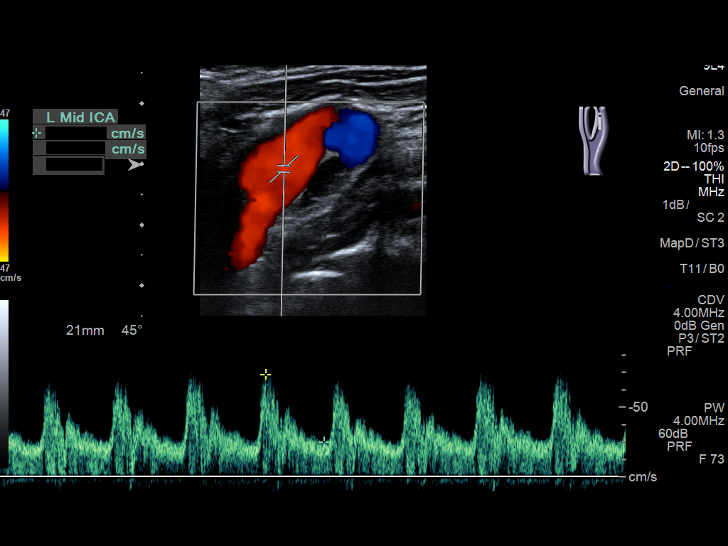
[im 48/48]
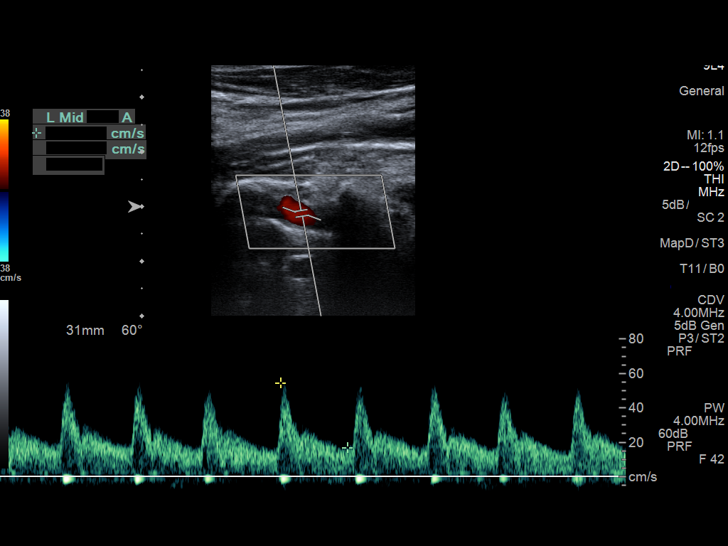

[13 of 24 positions shown; findings below may reference images not displayed]

FINDINGS: Criteria: Quantification of carotid stenosis is based on velocity
parameters that correlate the residual internal carotid diameter
with NASCET-based stenosis levels, using the diameter of the distal
internal carotid lumen as the denominator for stenosis measurement.

The following velocity measurements were obtained:

RIGHT

ICA:  84/36 cm/sec

CCA:  103/19 cm/sec

SYSTOLIC ICA/CCA RATIO:

DIASTOLIC ICA/CCA RATIO:

ECA:  100 cm/sec

LEFT

ICA:  74/25 cm/sec

CCA:  111/28 cm/sec

SYSTOLIC ICA/CCA RATIO:

DIASTOLIC ICA/CCA RATIO:

ECA:  113 cm/sec

RIGHT CAROTID ARTERY: There is no grayscale evidence significant
intimal thickening or atherosclerotic plaque affecting interrogated
portions of the right carotid system. There are no elevated peak
systolic velocities within the interrogated course of the right
internal carotid artery to suggest a hemodynamically significant
stenosis.

RIGHT VERTEBRAL ARTERY:  Antegrade flow

LEFT CAROTID ARTERY: There is no grayscale evidence of significant
intimal thickening or atherosclerotic plaque affecting interrogated
portions of the left carotid system. There are no elevated peak
systolic velocities within the interrogated course of the left
internal carotid artery to suggest a hemodynamically significant
stenosis. The left internal carotid artery is noted to be tortuous
at its origin and proximal aspects (image 45).

LEFT VERTEBRAL ARTERY:  Antegrade flow
IMPRESSION: Unremarkable carotid Doppler ultrasound.

## 2019-02-26 IMAGING — US US RENAL
1 series · 13 of 25 positions shown · non-contrast
Comparison: Abdomen CT dated 02/27/2005.

CLINICAL DATA: Renal failure.

EXAM:
RENAL / URINARY TRACT ULTRASOUND COMPLETE

[Series 1: us renal · 0.28mm/px · 13 of 51 slices shown]
[im 1/51]
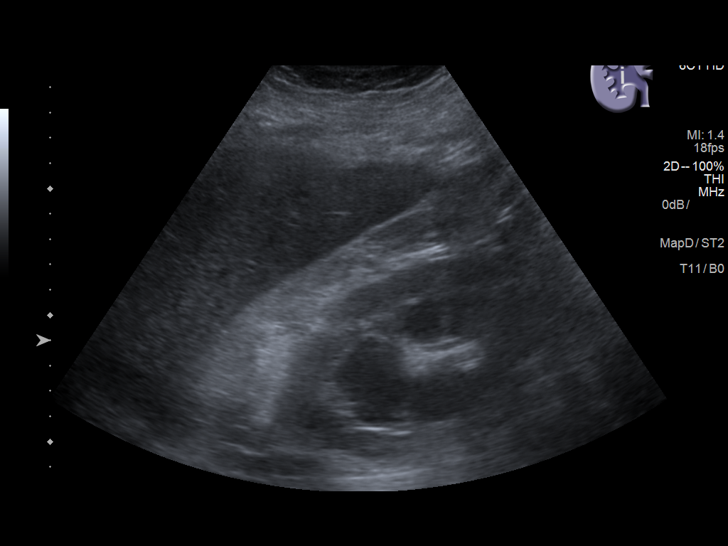
[im 5/51]
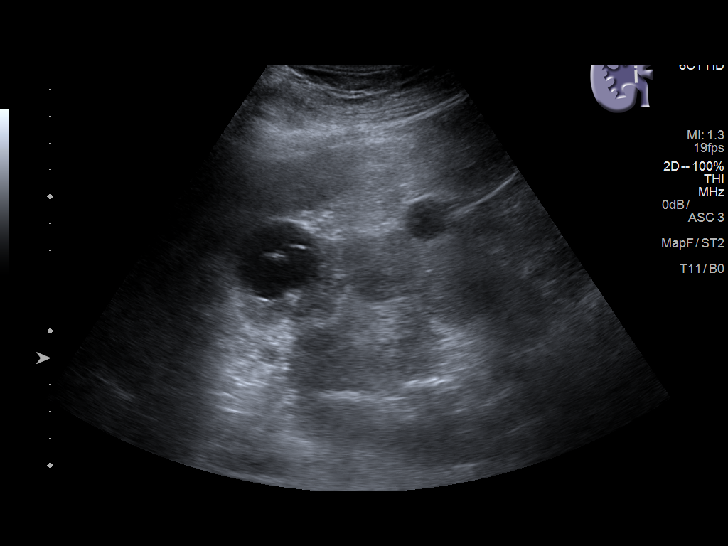
[im 9/51]
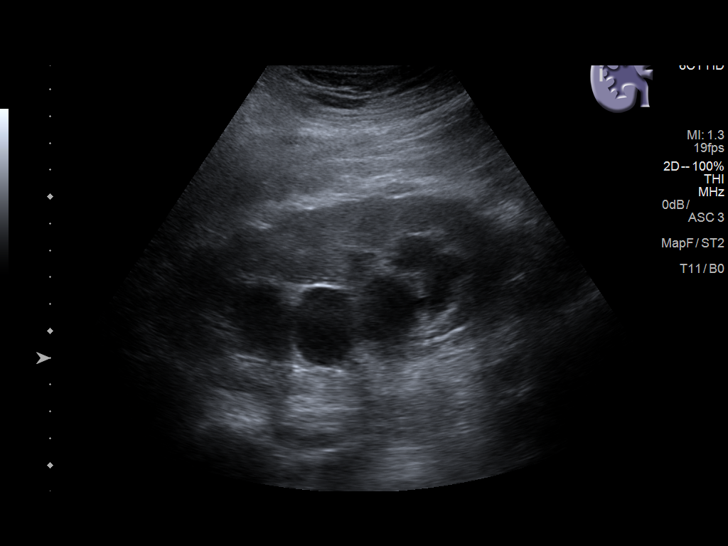
[im 13/51]
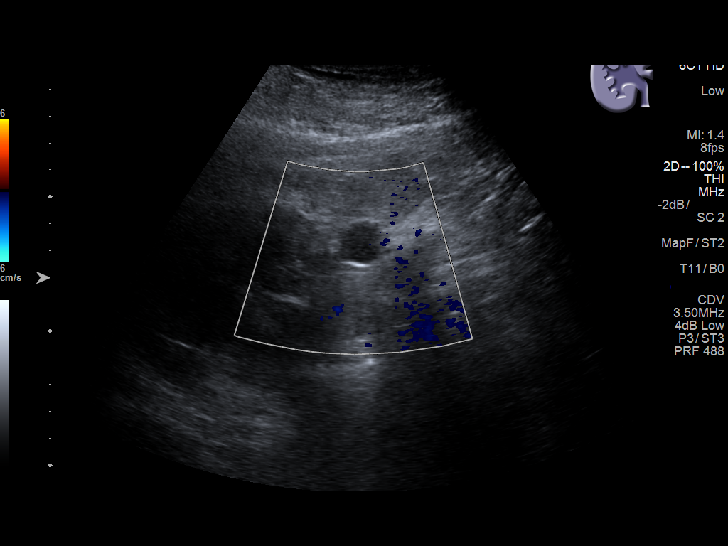
[im 17/51]
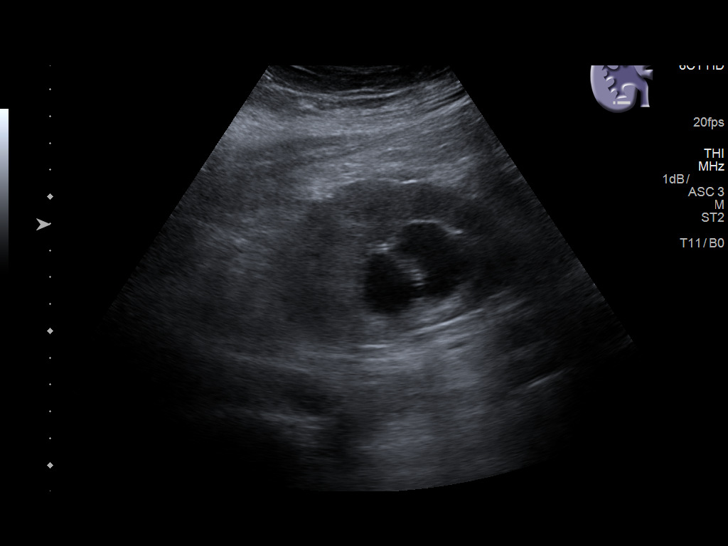
[im 21/51]
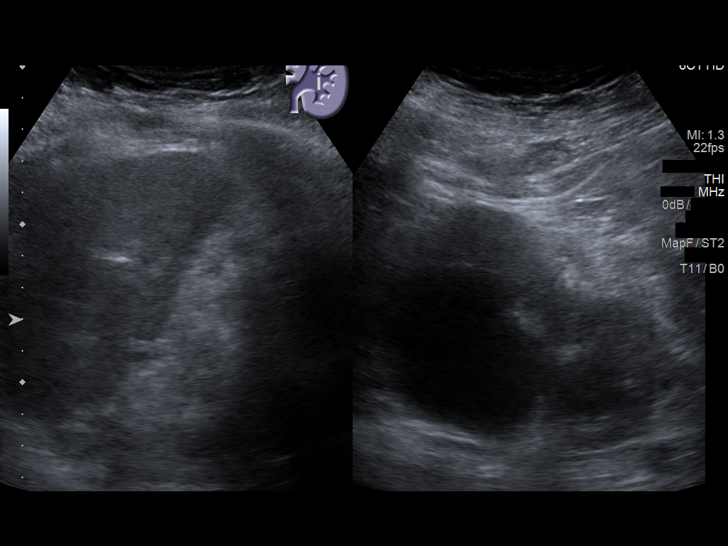
[im 26/51]
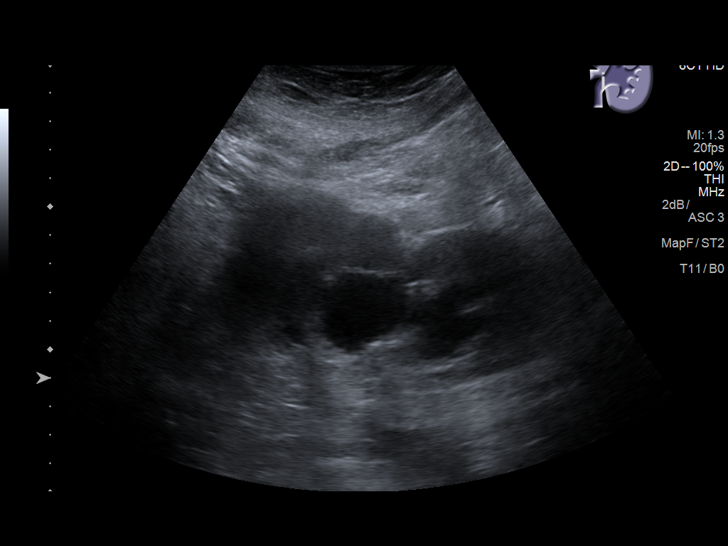
[im 30/51]
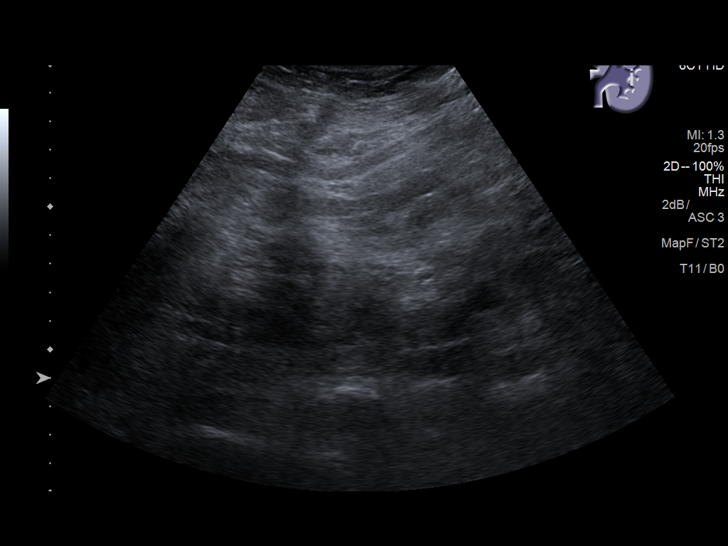
[im 34/51]
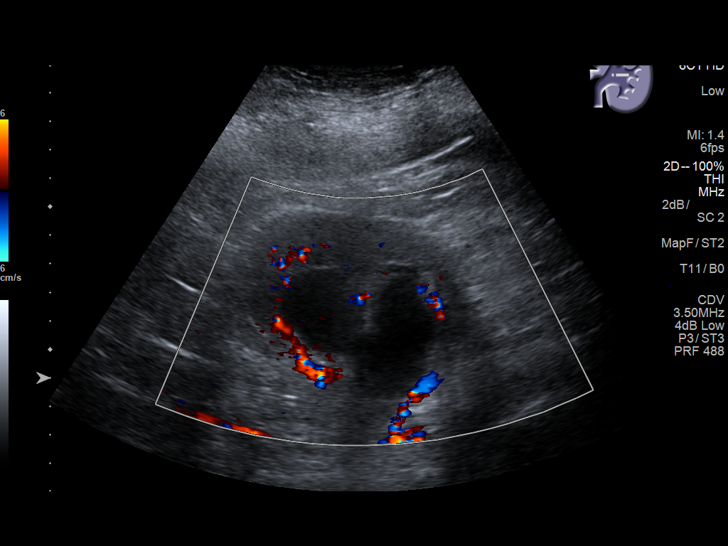
[im 38/51]
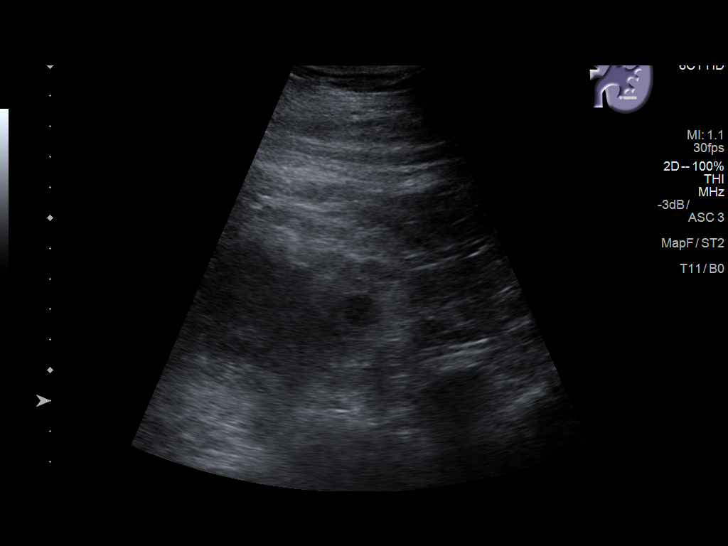
[im 42/51]
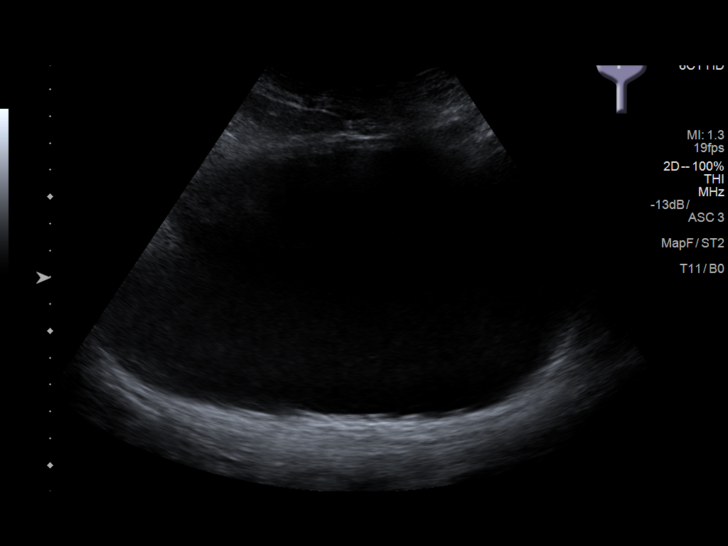
[im 46/51]
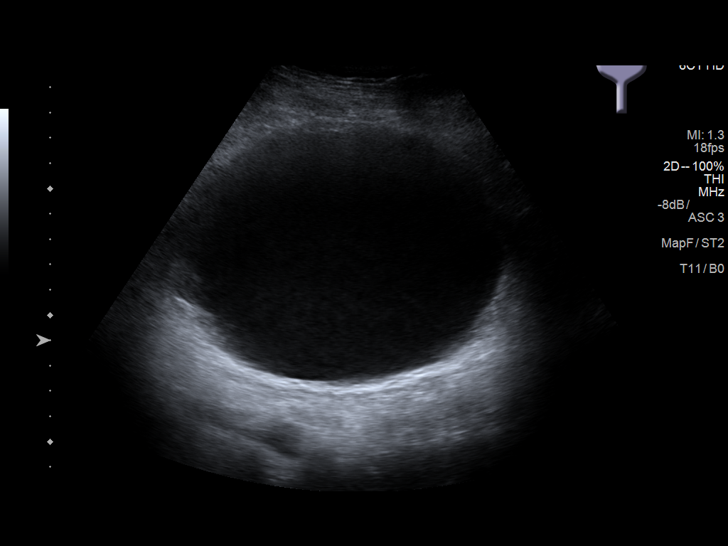
[im 51/51]
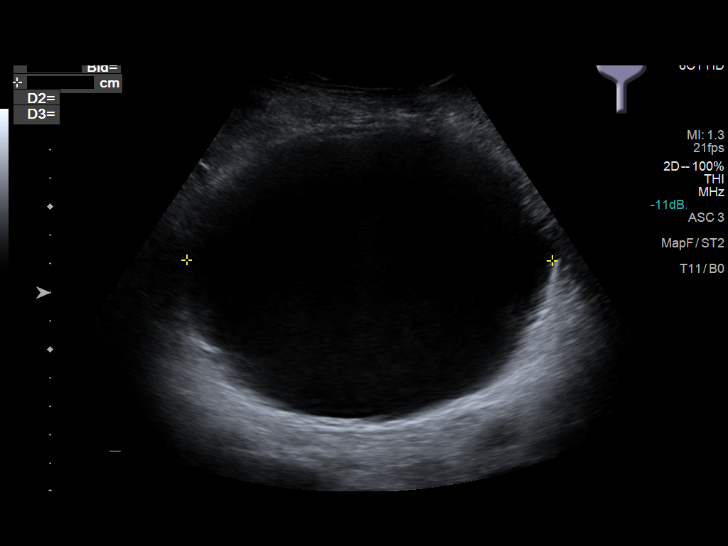

[13 of 25 positions shown; findings below may reference images not displayed]

FINDINGS: Right Kidney:

Length: 14.5 cm. Mildly echogenic. Moderate dilatation of the renal
collecting system. Multiple cysts. These include a 3.1 cm upper pole
exophytic cyst with a thin and mildly focally thickened internal
membrane with hypoechoic material between the membrane and anterior
surface of the cyst. There is some internal blood flow within the
membrane with color Doppler. There are additional smaller simple
appearing cysts. These were not previously present.

Left Kidney:

Length: 12.2 cm. Mildly echogenic. Moderate to marked dilatation of
the collecting system. Small exophytic lower pole simple cysts.

Bladder:

Markedly distended urinary bladder with a calculated volume of 0042
cc. The patient was unable to void.
IMPRESSION: 1. Markedly distended urinary bladder with moderate right and
moderate-to-marked left hydronephrosis.
2. 3.1 cm complex upper pole right renal cyst. This is concerning
for the possibility of a cystic malignancy. Further evaluation with
pre and postcontrast magnetic resonance imaging or pre and
postcontrast CT imaging of the kidneys is recommended when the
patient's renal function permits.
3. Mildly echogenic kidneys, compatible with mild medical renal
disease.

## 2019-04-21 ENCOUNTER — Other Ambulatory Visit: Payer: Self-pay

## 2019-04-21 ENCOUNTER — Ambulatory Visit (INDEPENDENT_AMBULATORY_CARE_PROVIDER_SITE_OTHER): Payer: Medicare Other | Admitting: *Deleted

## 2019-04-21 DIAGNOSIS — R55 Syncope and collapse: Secondary | ICD-10-CM

## 2019-04-21 DIAGNOSIS — R001 Bradycardia, unspecified: Secondary | ICD-10-CM

## 2019-04-21 LAB — CUP PACEART REMOTE DEVICE CHECK
Battery Remaining Longevity: 161 mo
Battery Voltage: 3.05 V
Brady Statistic AP VP Percent: 0.02 %
Brady Statistic AP VS Percent: 25.36 %
Brady Statistic AS VP Percent: 0.05 %
Brady Statistic AS VS Percent: 74.57 %
Brady Statistic RA Percent Paced: 25.52 %
Brady Statistic RV Percent Paced: 0.07 %
Date Time Interrogation Session: 20200428045843
Implantable Lead Implant Date: 20181024
Implantable Lead Implant Date: 20181024
Implantable Lead Location: 753859
Implantable Lead Location: 753860
Implantable Lead Model: 3830
Implantable Lead Model: 5076
Implantable Pulse Generator Implant Date: 20181024
Lead Channel Impedance Value: 361 Ohm
Lead Channel Impedance Value: 399 Ohm
Lead Channel Impedance Value: 456 Ohm
Lead Channel Impedance Value: 456 Ohm
Lead Channel Pacing Threshold Amplitude: 0.5 V
Lead Channel Pacing Threshold Amplitude: 1.25 V
Lead Channel Pacing Threshold Pulse Width: 0.4 ms
Lead Channel Pacing Threshold Pulse Width: 0.4 ms
Lead Channel Sensing Intrinsic Amplitude: 4 mV
Lead Channel Sensing Intrinsic Amplitude: 4 mV
Lead Channel Sensing Intrinsic Amplitude: 4.375 mV
Lead Channel Sensing Intrinsic Amplitude: 4.375 mV
Lead Channel Setting Pacing Amplitude: 2 V
Lead Channel Setting Pacing Amplitude: 2.5 V
Lead Channel Setting Pacing Pulse Width: 1 ms
Lead Channel Setting Sensing Sensitivity: 0.9 mV

## 2019-04-29 NOTE — Progress Notes (Signed)
Remote pacemaker transmission.   

## 2019-07-21 ENCOUNTER — Ambulatory Visit (INDEPENDENT_AMBULATORY_CARE_PROVIDER_SITE_OTHER): Payer: Medicare Other | Admitting: *Deleted

## 2019-07-21 DIAGNOSIS — R001 Bradycardia, unspecified: Secondary | ICD-10-CM

## 2019-07-21 LAB — CUP PACEART REMOTE DEVICE CHECK
Battery Remaining Longevity: 157 mo
Battery Voltage: 3.04 V
Brady Statistic AP VP Percent: 0.03 %
Brady Statistic AP VS Percent: 26.09 %
Brady Statistic AS VP Percent: 0.05 %
Brady Statistic AS VS Percent: 73.83 %
Brady Statistic RA Percent Paced: 26.27 %
Brady Statistic RV Percent Paced: 0.08 %
Date Time Interrogation Session: 20200728115544
Implantable Lead Implant Date: 20181024
Implantable Lead Implant Date: 20181024
Implantable Lead Location: 753859
Implantable Lead Location: 753860
Implantable Lead Model: 3830
Implantable Lead Model: 5076
Implantable Pulse Generator Implant Date: 20181024
Lead Channel Impedance Value: 361 Ohm
Lead Channel Impedance Value: 380 Ohm
Lead Channel Impedance Value: 437 Ohm
Lead Channel Impedance Value: 456 Ohm
Lead Channel Pacing Threshold Amplitude: 0.5 V
Lead Channel Pacing Threshold Amplitude: 1.25 V
Lead Channel Pacing Threshold Pulse Width: 0.4 ms
Lead Channel Pacing Threshold Pulse Width: 0.4 ms
Lead Channel Sensing Intrinsic Amplitude: 3.625 mV
Lead Channel Sensing Intrinsic Amplitude: 3.625 mV
Lead Channel Sensing Intrinsic Amplitude: 4.5 mV
Lead Channel Sensing Intrinsic Amplitude: 4.5 mV
Lead Channel Setting Pacing Amplitude: 2 V
Lead Channel Setting Pacing Amplitude: 2.5 V
Lead Channel Setting Pacing Pulse Width: 1 ms
Lead Channel Setting Sensing Sensitivity: 0.9 mV

## 2019-08-03 ENCOUNTER — Encounter: Payer: Self-pay | Admitting: Cardiology

## 2019-08-03 NOTE — Progress Notes (Signed)
Remote pacemaker transmission.   

## 2019-08-11 ENCOUNTER — Other Ambulatory Visit: Payer: Self-pay | Admitting: Family Medicine

## 2019-08-11 ENCOUNTER — Other Ambulatory Visit: Payer: Self-pay

## 2019-08-11 ENCOUNTER — Encounter: Payer: Self-pay | Admitting: Urology

## 2019-08-11 ENCOUNTER — Ambulatory Visit (INDEPENDENT_AMBULATORY_CARE_PROVIDER_SITE_OTHER): Payer: Medicare Other | Admitting: Urology

## 2019-08-11 VITALS — BP 153/85 | HR 66 | Ht 67.0 in | Wt 239.0 lb

## 2019-08-11 DIAGNOSIS — N183 Chronic kidney disease, stage 3 unspecified: Secondary | ICD-10-CM

## 2019-08-11 DIAGNOSIS — R8271 Bacteriuria: Secondary | ICD-10-CM | POA: Diagnosis not present

## 2019-08-11 DIAGNOSIS — N401 Enlarged prostate with lower urinary tract symptoms: Secondary | ICD-10-CM | POA: Diagnosis not present

## 2019-08-11 DIAGNOSIS — R310 Gross hematuria: Secondary | ICD-10-CM

## 2019-08-11 DIAGNOSIS — N138 Other obstructive and reflux uropathy: Secondary | ICD-10-CM | POA: Diagnosis not present

## 2019-08-11 NOTE — Progress Notes (Signed)
08/11/2019 1:56 PM   Anthony Palmer [birth year] [id number]  Referring provider: Maryland Pink, MD [address] [facility] [place],  [place] [address]  Chief Complaint  Patient presents with  . Follow-up    HPI: 68-year-old male with a personal history of BPH/history of urinary retention status post TURP in 2018.  He returns today for routine follow-up.  Initial presentation with profound obstructive uropathy, bilateral hydronephrosis and elevated creatinine to 2.2 from his baseline of 1.3.  Surgical pathology was benign.  His PSA is remained stable at 0.3 as of 07/2018.  PSA today is pending.  He does have a documented Klebsiella UTI from 02/20/2019.  It appears he was treated by his primary care physician.  He reports that this was part of his lab work only and he was not having symptoms at the time.  This has happened on several occasions since his TURP procedure where he has 0 symptoms but is been treated regardless.  He is asymptomatic today without dysuria, frequency or urgency.  He is no longer on any BPH medications include no Flomax/finasteride.  Overall, he is doing very well.  He has no significant urinary complaints.  He feels like he is emptying.  He is pleased with this stream.  IPSS as below.  IPSS    Row Name 08/11/19 1100         International Prostate Symptom Score   How often have you had the sensation of not emptying your bladder?  Less than 1 in 5     How often have you had to urinate less than every two hours?  Less than 1 in 5 times     How often have you found you stopped and started again several times when you urinated?  Less than 1 in 5 times     How often have you found it difficult to postpone urination?  Less than half the time     How often have you had a weak urinary stream?  Less than 1 in 5 times     How often have you had to strain to start urination?  Not at All     How many times did you typically get up at night to urinate?  1 Time      Total IPSS Score  7       Quality of Life due to urinary symptoms   If you were to spend the rest of your life with your urinary condition just the way it is now how would you feel about that?  Pleased        Score:  1-7 Mild 8-19 Moderate 20-35 Severe    PMH: Past Medical History:  Diagnosis Date  . Arthritis    "hands, wrists" (10/16/2017)  . Bilateral hydronephrosis    a. 08/2017 in setting of bladder outlet obstruction.  . Bladder outlet obstruction    a. 08/2017 noted in setting of rising creatinine-->d/c'd w/ foley and plan for outpt urology f/u.  [name] Childhood asthma    "outgrew it"  . CKD (chronic kidney disease), stage III ([name])   . Gout    "on daily RX" (10/16/2017)  . Hyperlipidemia   . Hyperlipidemia   . Hypertension   . OSA on CPAP   . Presence of permanent cardiac pacemaker 10/16/2017  . Self-catheterizes urinary bladder   . Skin cancer    "back" (10/16/2017)  . Syncope    a. 08/2017 Echo: EF 60-65%, no rwma, Gr1 DD,  nl RV fxn; b. 08/2017 carotid u/s: unremarkable.  . Trifascicular block   . Trigger finger, acquired   . Type II diabetes mellitus ([name])     Surgical History: Past Surgical History:  Procedure Laterality Date  . COLONOSCOPY WITH PROPOFOL N/A 07/23/2016   Procedure: COLONOSCOPY WITH PROPOFOL;  Surgeon: Anthony Sails, MD;  Location: [facility] ENDOSCOPY;  Service: Endoscopy;  Laterality: N/A;  . CYSTOSCOPY W/ RETROGRADES Bilateral 11/11/2017   Procedure: CYSTOSCOPY WITH RETROGRADE PYELOGRAM;  Surgeon: Anthony Espy, MD;  Location: [facility] ORS;  Service: Urology;  Laterality: Bilateral;  . CYSTOSCOPY WITH FULGERATION N/A 11/13/2017   Procedure: CYSTOSCOPY WITH FULGERATION;  Surgeon: Anthony Espy, MD;  Location: [facility] ORS;  Service: Urology;  Laterality: N/A;  . INCISION AND DRAINAGE Left 2013   Hematoma left leg  . INSERT / REPLACE / REMOVE PACEMAKER  10/16/2017  . PACEMAKER IMPLANT N/A 10/16/2017   Procedure: PACEMAKER IMPLANT;  Surgeon:  Anthony Sprang, MD;  Location: [facility];  Service: Cardiovascular;  Laterality: N/A;  . SKIN CANCER EXCISION     "off my back"  . TRANSURETHRAL RESECTION OF PROSTATE N/A 11/11/2017   Procedure: TRANSURETHRAL RESECTION OF THE PROSTATE (TURP);  Surgeon: Anthony Espy, MD;  Location: [facility] ORS;  Service: Urology;  Laterality: N/A;    Home Medications:  Allergies as of 08/11/2019   No Known Allergies     Medication List       Accurate as of August 11, 2019  1:56 PM. If you have any questions, ask your nurse or doctor.        allopurinol 300 MG tablet Commonly known as: ZYLOPRIM Take 300 mg by mouth daily.   chlorthalidone 25 MG tablet Commonly known as: HYGROTON TAKE 1 TABLET BY MOUTH EVERY DAY IN THE MORNING   glipiZIDE 2.5 MG 24 hr tablet Commonly known as: GLUCOTROL XL Take 2.5 mg by mouth daily with breakfast.   losartan 25 MG tablet Commonly known as: COZAAR Take 25 mg by mouth daily.   lovastatin 40 MG tablet Commonly known as: MEVACOR Take 40 mg by mouth at bedtime.       Allergies: No Known Allergies  Family History: Family History  Problem Relation Age of Onset  . Heart disease Mother   . Heart attack Mother   . Heart disease Father   . Heart attack Father   . Stroke Father     Social History:  reports that he has never smoked. He has never used smokeless tobacco. He reports that he does not drink alcohol or use drugs.  ROS: UROLOGY Frequent Urination?: No Hard to postpone urination?: No Burning/pain with urination?: No Get up at night to urinate?: No Leakage of urine?: No Urine stream starts and stops?: No Trouble starting stream?: No Do you have to strain to urinate?: No Blood in urine?: No Urinary tract infection?: No Sexually transmitted disease?: No Injury to kidneys or bladder?: No Painful intercourse?: No Weak stream?: No Erection problems?: No Penile pain?: No  Gastrointestinal Nausea?: No Vomiting?: No  Indigestion/heartburn?: No Diarrhea?: No Constipation?: No  Constitutional Fever: No Night sweats?: No Weight loss?: No Fatigue?: No  Skin Skin rash/lesions?: No Itching?: No  Eyes Blurred vision?: No Double vision?: No  Ears/Nose/Throat Sore throat?: No Sinus problems?: No  Hematologic/Lymphatic Swollen glands?: No Easy bruising?: No  Cardiovascular Leg swelling?: No Chest pain?: No  Respiratory Cough?: No Shortness of breath?: No  Endocrine Excessive thirst?: No  Musculoskeletal Back pain?: No Joint pain?: No  Neurological Headaches?: No  Psychologic Depression?: No Anxiety?: No  Physical Exam: BP (!) 153/85   Pulse 66   Ht 5\' 7"  (1.702 m)   Wt 239 lb (108.4 kg)   BMI 37.43 kg/m   Constitutional:  Alert and oriented, No acute distress. HEENT: [place] AT, moist mucus membranes.  Trachea midline, no masses. Cardiovascular: No clubbing, cyanosis, or edema. Respiratory: Normal respiratory effort, no increased work of breathing. GI: Abdomen is soft, nontender, nondistended, no abdominal masses, obese Rectal: Normal sphincter tone.  40 cc prostate, nontender, no nodules. Skin: No rashes, bruises or suspicious lesions. Neurologic: Grossly intact, no focal deficits, moving all 4 extremities. Psychiatric: Normal mood and affect.  Laboratory Data: PSA today pending  Cr 1.3 on 02/20/19  Urinalysis Unable to give a urine today  Pertinent Imaging: PVR 0 cc  Assessment & Plan:    1. BPH with urinary obstruction Status post successful TURP Minimal voiding symptoms on no BPH medication PSA screening/rectal exam today, PSA pending - PSA  2. CKD (chronic kidney disease), stage III (HCC) Creatinine at baseline, 1.3  3. Asymptomatic bacteriuria Discussed treatment only when symptomatic Plan to recheck urine tomograms in stable tomorrow to assess if this is ongoing issue or only intermittent May consider repeating renal ultrasound if his UA is frankly  positive to rule out upper tract pathology although not suspected   Return for please give urine cup to drop off specimen tomorrow.  Anthony Espy, MD  [facility] [address] [place], [place] [address] [phone]

## 2019-08-12 ENCOUNTER — Telehealth: Payer: Self-pay

## 2019-08-12 ENCOUNTER — Other Ambulatory Visit: Payer: Medicare Other

## 2019-08-12 DIAGNOSIS — R310 Gross hematuria: Secondary | ICD-10-CM

## 2019-08-12 LAB — URINALYSIS, COMPLETE
Bilirubin, UA: NEGATIVE
Glucose, UA: NEGATIVE
Ketones, UA: NEGATIVE
Nitrite, UA: POSITIVE — AB
Specific Gravity, UA: 1.02 (ref 1.005–1.030)
Urobilinogen, Ur: 0.2 mg/dL (ref 0.2–1.0)
pH, UA: 5.5 (ref 5.0–7.5)

## 2019-08-12 LAB — MICROSCOPIC EXAMINATION: WBC, UA: >30 /hpf — AB (ref 0–5)

## 2019-08-12 LAB — PSA: Prostate Specific Ag, Serum: 1 ng/mL (ref 0.0–4.0)

## 2019-08-12 NOTE — Telephone Encounter (Signed)
-----   Message from Hollice Espy, MD sent at 08/12/2019  8:22 AM EDT ----- PSa stable, low 1.0.  THis is up but you are no longer on finasteride so expected.  Overall good news.    Hollice Espy, MD

## 2019-08-12 NOTE — Telephone Encounter (Signed)
LMOM to return call to office.

## 2019-08-14 LAB — CULTURE, URINE COMPREHENSIVE

## 2019-08-14 NOTE — Telephone Encounter (Signed)
mychart sent.

## 2019-08-20 ENCOUNTER — Other Ambulatory Visit: Payer: Self-pay | Admitting: Family Medicine

## 2019-08-20 ENCOUNTER — Telehealth: Payer: Self-pay | Admitting: Family Medicine

## 2019-08-20 DIAGNOSIS — N200 Calculus of kidney: Secondary | ICD-10-CM

## 2019-08-20 NOTE — Telephone Encounter (Signed)
Patient notified and voiced understanding. Order for KUB entered

## 2019-08-20 NOTE — Telephone Encounter (Signed)
-----   Message from Hollice Espy, MD sent at 08/20/2019  8:46 AM EDT ----- This patient is chronically colonized with bacteria.  He is asymptomatic.  I would recommend that he go by and get a KUB to ensure that he has no stones in his upper tract contributing to his persistent Klebsiella positive cultures.  Please place order and have him swing by and get this done at some point.  Hollice Espy, MD

## 2019-08-21 ENCOUNTER — Ambulatory Visit
Admission: RE | Admit: 2019-08-21 | Discharge: 2019-08-21 | Disposition: A | Discharge status: Home / Self Care | Payer: Medicare Other | Source: Ambulatory Visit | Attending: Urology | Admitting: Urology

## 2019-08-21 ENCOUNTER — Other Ambulatory Visit: Payer: Self-pay

## 2019-08-21 ENCOUNTER — Ambulatory Visit
Admission: RE | Admit: 2019-08-21 | Discharge: 2019-08-21 | Disposition: A | Discharge status: Home / Self Care | Payer: Medicare Other | Attending: Urology | Admitting: Urology

## 2019-08-21 DIAGNOSIS — N200 Calculus of kidney: Secondary | ICD-10-CM | POA: Insufficient documentation

## 2019-10-20 ENCOUNTER — Ambulatory Visit (INDEPENDENT_AMBULATORY_CARE_PROVIDER_SITE_OTHER): Payer: Medicare Other | Admitting: *Deleted

## 2019-10-20 DIAGNOSIS — R001 Bradycardia, unspecified: Secondary | ICD-10-CM

## 2019-10-20 DIAGNOSIS — R55 Syncope and collapse: Secondary | ICD-10-CM

## 2019-10-21 LAB — CUP PACEART REMOTE DEVICE CHECK
Battery Remaining Longevity: 154 mo
Battery Voltage: 3.03 V
Brady Statistic AP VP Percent: 0.04 %
Brady Statistic AP VS Percent: 27.45 %
Brady Statistic AS VP Percent: 0.05 %
Brady Statistic AS VS Percent: 72.45 %
Brady Statistic RA Percent Paced: 27.61 %
Brady Statistic RV Percent Paced: 0.09 %
Date Time Interrogation Session: 20201028042414
Implantable Lead Implant Date: 20181024
Implantable Lead Implant Date: 20181024
Implantable Lead Location: 753859
Implantable Lead Location: 753860
Implantable Lead Model: 3830
Implantable Lead Model: 5076
Implantable Pulse Generator Implant Date: 20181024
Lead Channel Impedance Value: 361 Ohm
Lead Channel Impedance Value: 380 Ohm
Lead Channel Impedance Value: 418 Ohm
Lead Channel Impedance Value: 456 Ohm
Lead Channel Pacing Threshold Amplitude: 0.5 V
Lead Channel Pacing Threshold Amplitude: 1.25 V
Lead Channel Pacing Threshold Pulse Width: 0.4 ms
Lead Channel Pacing Threshold Pulse Width: 0.4 ms
Lead Channel Sensing Intrinsic Amplitude: 3 mV
Lead Channel Sensing Intrinsic Amplitude: 3 mV
Lead Channel Sensing Intrinsic Amplitude: 4.875 mV
Lead Channel Sensing Intrinsic Amplitude: 4.875 mV
Lead Channel Setting Pacing Amplitude: 2 V
Lead Channel Setting Pacing Amplitude: 2.5 V
Lead Channel Setting Pacing Pulse Width: 1 ms
Lead Channel Setting Sensing Sensitivity: 0.9 mV

## 2019-11-10 NOTE — Progress Notes (Signed)
Remote pacemaker transmission.   

## 2020-01-19 ENCOUNTER — Ambulatory Visit (INDEPENDENT_AMBULATORY_CARE_PROVIDER_SITE_OTHER): Payer: Medicare Other | Admitting: *Deleted

## 2020-01-19 DIAGNOSIS — R55 Syncope and collapse: Secondary | ICD-10-CM

## 2020-01-19 LAB — CUP PACEART REMOTE DEVICE CHECK
Battery Remaining Longevity: 151 mo
Battery Voltage: 3.03 V
Brady Statistic AP VP Percent: 0.05 %
Brady Statistic AP VS Percent: 25.89 %
Brady Statistic AS VP Percent: 0.05 %
Brady Statistic AS VS Percent: 74 %
Brady Statistic RA Percent Paced: 26.09 %
Brady Statistic RV Percent Paced: 0.1 %
Date Time Interrogation Session: 20210125232917
Implantable Lead Implant Date: 20181024
Implantable Lead Implant Date: 20181024
Implantable Lead Location: 753859
Implantable Lead Location: 753860
Implantable Lead Model: 3830
Implantable Lead Model: 5076
Implantable Pulse Generator Implant Date: 20181024
Lead Channel Impedance Value: 361 Ohm
Lead Channel Impedance Value: 380 Ohm
Lead Channel Impedance Value: 399 Ohm
Lead Channel Impedance Value: 456 Ohm
Lead Channel Pacing Threshold Amplitude: 0.5 V
Lead Channel Pacing Threshold Amplitude: 1.125 V
Lead Channel Pacing Threshold Pulse Width: 0.4 ms
Lead Channel Pacing Threshold Pulse Width: 0.4 ms
Lead Channel Sensing Intrinsic Amplitude: 3.75 mV
Lead Channel Sensing Intrinsic Amplitude: 3.75 mV
Lead Channel Sensing Intrinsic Amplitude: 4.375 mV
Lead Channel Sensing Intrinsic Amplitude: 4.375 mV
Lead Channel Setting Pacing Amplitude: 2 V
Lead Channel Setting Pacing Amplitude: 2.5 V
Lead Channel Setting Pacing Pulse Width: 1 ms
Lead Channel Setting Sensing Sensitivity: 0.9 mV

## 2020-02-22 IMAGING — CR ABDOMEN - 1 VIEW
1 series · 2 of 2 positions shown · non-contrast
Comparison: Ultrasound 10/02/2017.  CT 09/24/2017.

CLINICAL DATA: Recurrent UTIs.

EXAM:
ABDOMEN - 1 VIEW

[Series 1: dg abd 1 view · 0.14mm/px · 2 of 2 slices shown]
[im 1/2]
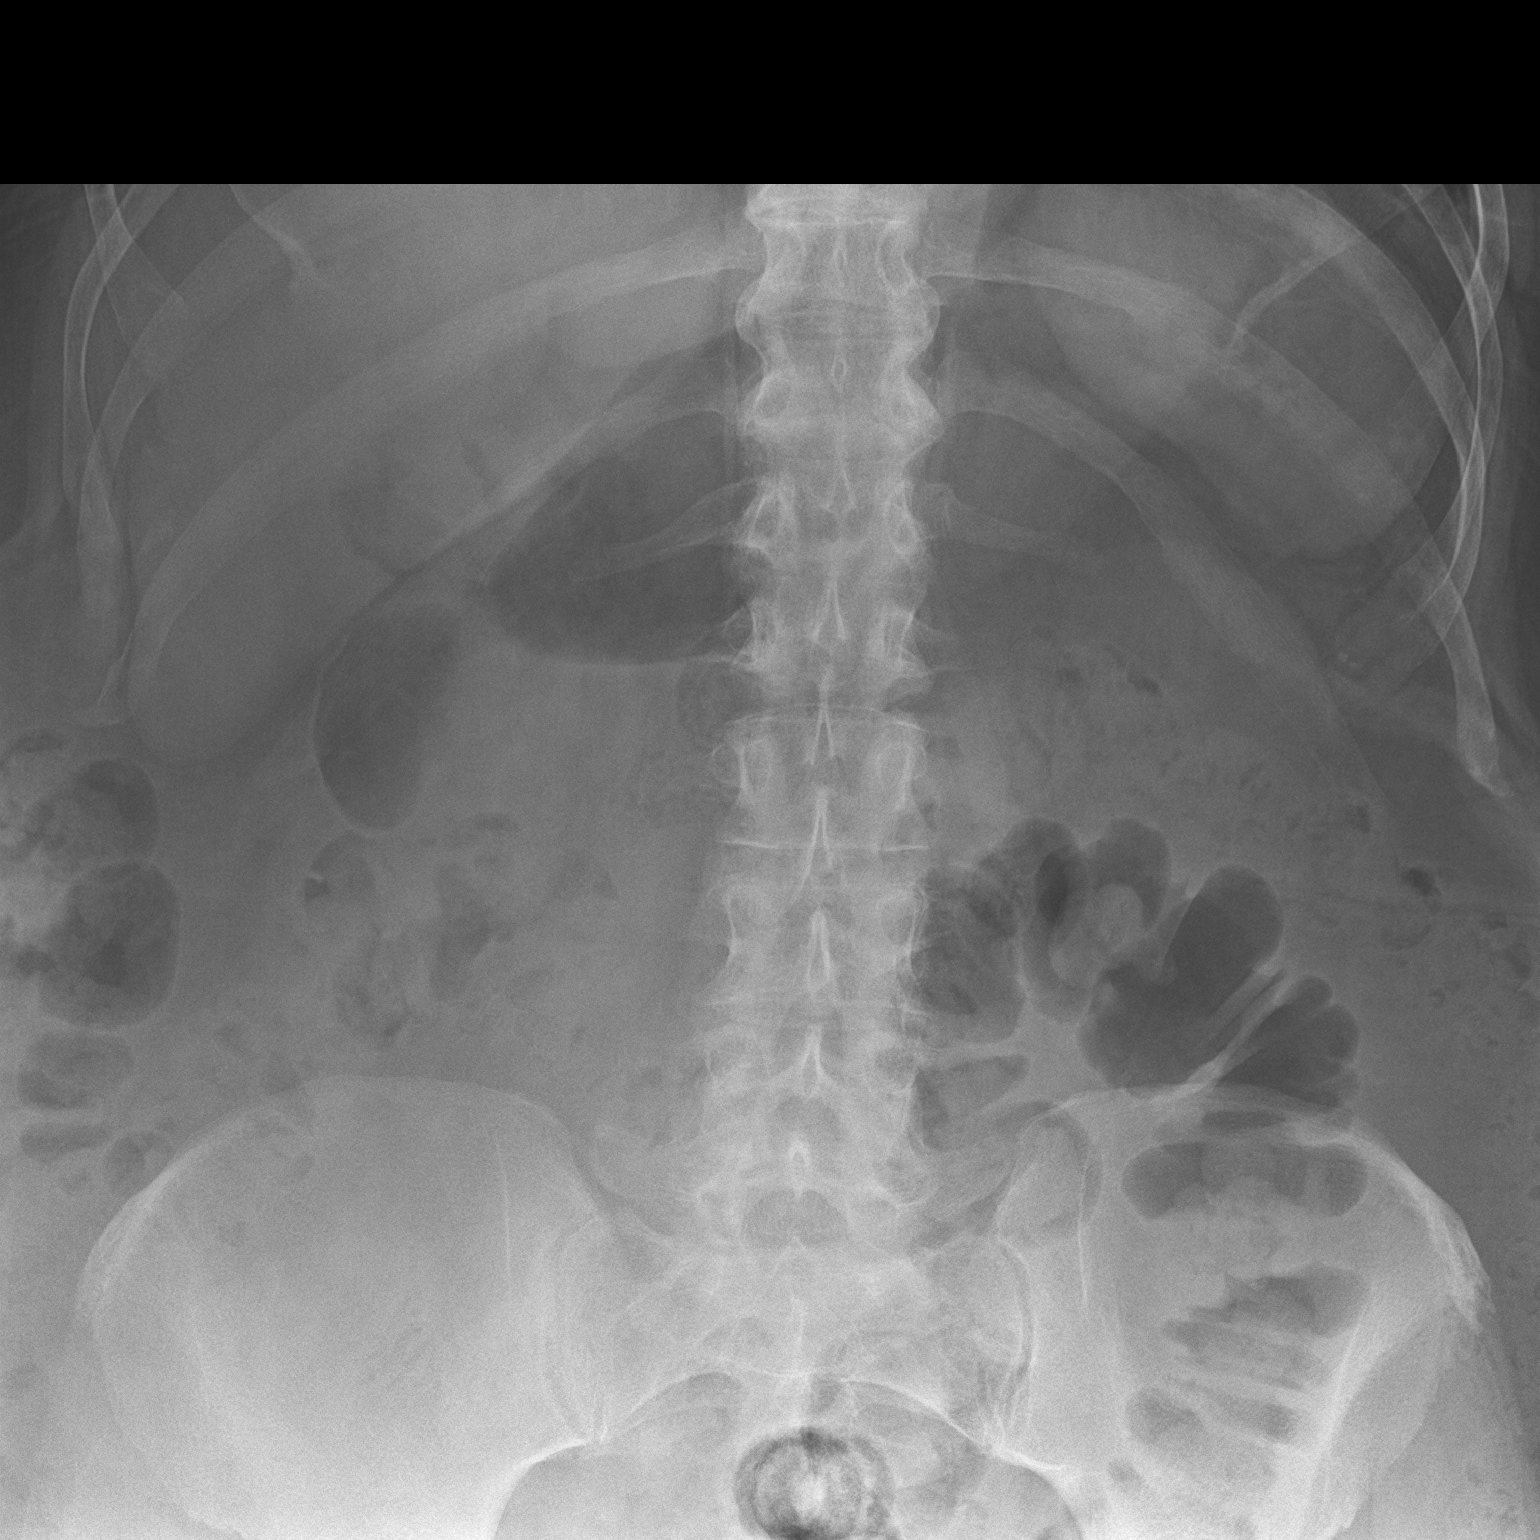
[im 2/2]
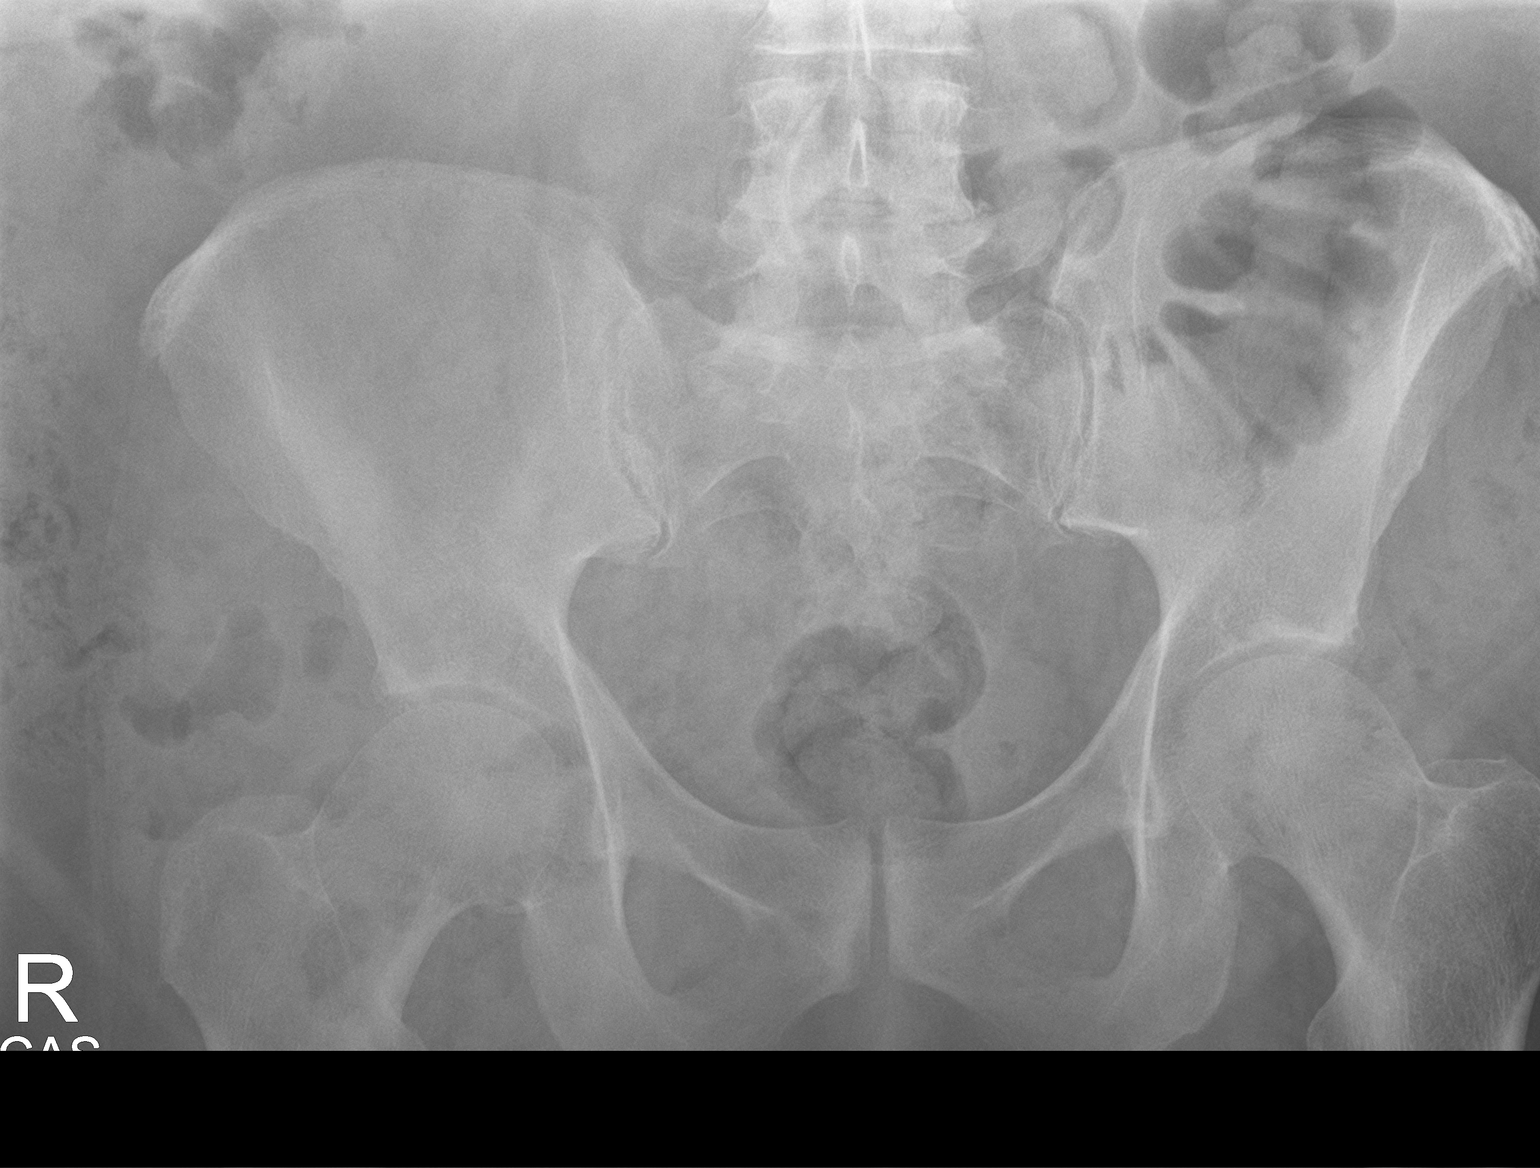

[2 of 2 positions shown; findings below may reference images not displayed]

FINDINGS: Soft tissue structures are unremarkable. Large amount of stool noted
throughout colon. Large amount of stool makes evaluation for renal
stone disease difficult. No renal or ureteral stone identified.
Degenerative change thoracolumbar spine. Ankylosis of the thoracic
spine appears to be present. This suggest ankylosing spondylitis.
IMPRESSION: Large amount of stool noted throughout colon suggesting
constipation. Large amount of stool makes evaluation for renal stone
disease difficult. No renal stones or ureteral stones identified. No
acute abnormality identified.

## 2020-04-19 ENCOUNTER — Ambulatory Visit (INDEPENDENT_AMBULATORY_CARE_PROVIDER_SITE_OTHER): Payer: Medicare Other | Admitting: *Deleted

## 2020-04-19 DIAGNOSIS — R55 Syncope and collapse: Secondary | ICD-10-CM | POA: Diagnosis not present

## 2020-04-19 LAB — CUP PACEART REMOTE DEVICE CHECK
Battery Remaining Longevity: 148 mo
Battery Voltage: 3.03 V
Brady Statistic AP VP Percent: 0.1 %
Brady Statistic AP VS Percent: 28.72 %
Brady Statistic AS VP Percent: 0.05 %
Brady Statistic AS VS Percent: 71.13 %
Brady Statistic RA Percent Paced: 28.92 %
Brady Statistic RV Percent Paced: 0.15 %
Date Time Interrogation Session: 20210427002649
Implantable Lead Implant Date: 20181024
Implantable Lead Implant Date: 20181024
Implantable Lead Location: 753859
Implantable Lead Location: 753860
Implantable Lead Model: 3830
Implantable Lead Model: 5076
Implantable Pulse Generator Implant Date: 20181024
Lead Channel Impedance Value: 361 Ohm
Lead Channel Impedance Value: 380 Ohm
Lead Channel Impedance Value: 418 Ohm
Lead Channel Impedance Value: 456 Ohm
Lead Channel Pacing Threshold Amplitude: 0.375 V
Lead Channel Pacing Threshold Amplitude: 1.125 V
Lead Channel Pacing Threshold Pulse Width: 0.4 ms
Lead Channel Pacing Threshold Pulse Width: 0.4 ms
Lead Channel Sensing Intrinsic Amplitude: 4.25 mV
Lead Channel Sensing Intrinsic Amplitude: 4.25 mV
Lead Channel Sensing Intrinsic Amplitude: 4.625 mV
Lead Channel Sensing Intrinsic Amplitude: 4.625 mV
Lead Channel Setting Pacing Amplitude: 2 V
Lead Channel Setting Pacing Amplitude: 2.5 V
Lead Channel Setting Pacing Pulse Width: 1 ms
Lead Channel Setting Sensing Sensitivity: 0.9 mV

## 2020-04-20 NOTE — Progress Notes (Signed)
PPM Remote  

## 2020-07-19 ENCOUNTER — Ambulatory Visit (INDEPENDENT_AMBULATORY_CARE_PROVIDER_SITE_OTHER): Payer: Medicare Other | Admitting: *Deleted

## 2020-07-19 DIAGNOSIS — Z950 Presence of cardiac pacemaker: Secondary | ICD-10-CM

## 2020-07-20 LAB — CUP PACEART REMOTE DEVICE CHECK
Battery Remaining Longevity: 144 mo
Battery Voltage: 3.02 V
Brady Statistic AP VP Percent: 0.12 %
Brady Statistic AP VS Percent: 30.78 %
Brady Statistic AS VP Percent: 0.05 %
Brady Statistic AS VS Percent: 69.06 %
Brady Statistic RA Percent Paced: 30.98 %
Brady Statistic RV Percent Paced: 0.17 %
Date Time Interrogation Session: 20210727003026
Implantable Lead Implant Date: 20181024
Implantable Lead Implant Date: 20181024
Implantable Lead Location: 753859
Implantable Lead Location: 753860
Implantable Lead Model: 3830
Implantable Lead Model: 5076
Implantable Pulse Generator Implant Date: 20181024
Lead Channel Impedance Value: 342 Ohm
Lead Channel Impedance Value: 380 Ohm
Lead Channel Impedance Value: 418 Ohm
Lead Channel Impedance Value: 437 Ohm
Lead Channel Pacing Threshold Amplitude: 0.5 V
Lead Channel Pacing Threshold Amplitude: 1.125 V
Lead Channel Pacing Threshold Pulse Width: 0.4 ms
Lead Channel Pacing Threshold Pulse Width: 0.4 ms
Lead Channel Sensing Intrinsic Amplitude: 3.75 mV
Lead Channel Sensing Intrinsic Amplitude: 3.75 mV
Lead Channel Sensing Intrinsic Amplitude: 4.75 mV
Lead Channel Sensing Intrinsic Amplitude: 4.75 mV
Lead Channel Setting Pacing Amplitude: 2 V
Lead Channel Setting Pacing Amplitude: 2.5 V
Lead Channel Setting Pacing Pulse Width: 1 ms
Lead Channel Setting Sensing Sensitivity: 0.9 mV

## 2020-07-22 NOTE — Progress Notes (Signed)
Remote pacemaker transmission.   

## 2020-09-07 ENCOUNTER — Other Ambulatory Visit
Admission: RE | Admit: 2020-09-07 | Discharge: 2020-09-07 | Disposition: A | Discharge status: Home / Self Care | Payer: Medicare Other | Source: Ambulatory Visit | Attending: Gastroenterology | Admitting: Gastroenterology

## 2020-09-07 ENCOUNTER — Other Ambulatory Visit: Payer: Self-pay

## 2020-09-07 DIAGNOSIS — Z20822 Contact with and (suspected) exposure to covid-19: Secondary | ICD-10-CM | POA: Insufficient documentation

## 2020-09-07 DIAGNOSIS — Z01812 Encounter for preprocedural laboratory examination: Secondary | ICD-10-CM | POA: Diagnosis present

## 2020-09-08 ENCOUNTER — Encounter: Payer: Self-pay | Admitting: Internal Medicine

## 2020-09-08 LAB — SARS CORONAVIRUS 2 (TAT 6-24 HRS): SARS Coronavirus 2: NEGATIVE

## 2020-09-09 ENCOUNTER — Other Ambulatory Visit: Payer: Self-pay

## 2020-09-09 ENCOUNTER — Encounter
Admission: RE | Disposition: A | Discharge status: Home / Self Care | Payer: Self-pay | Source: Home / Self Care | Attending: Gastroenterology

## 2020-09-09 ENCOUNTER — Ambulatory Visit
Admission: RE | Admit: 2020-09-09 | Discharge: 2020-09-09 | Disposition: A | Discharge status: Home / Self Care | Payer: Medicare Other | Attending: Gastroenterology | Admitting: Gastroenterology

## 2020-09-09 ENCOUNTER — Ambulatory Visit: Payer: Medicare Other | Admitting: Certified Registered"

## 2020-09-09 ENCOUNTER — Encounter: Payer: Self-pay | Admitting: Gastroenterology

## 2020-09-09 DIAGNOSIS — I129 Hypertensive chronic kidney disease with stage 1 through stage 4 chronic kidney disease, or unspecified chronic kidney disease: Secondary | ICD-10-CM | POA: Insufficient documentation

## 2020-09-09 DIAGNOSIS — K6389 Other specified diseases of intestine: Secondary | ICD-10-CM | POA: Diagnosis not present

## 2020-09-09 DIAGNOSIS — Z1211 Encounter for screening for malignant neoplasm of colon: Secondary | ICD-10-CM | POA: Diagnosis not present

## 2020-09-09 DIAGNOSIS — Z7984 Long term (current) use of oral hypoglycemic drugs: Secondary | ICD-10-CM | POA: Insufficient documentation

## 2020-09-09 DIAGNOSIS — Z86010 Personal history of colonic polyps: Secondary | ICD-10-CM | POA: Diagnosis not present

## 2020-09-09 DIAGNOSIS — M109 Gout, unspecified: Secondary | ICD-10-CM | POA: Insufficient documentation

## 2020-09-09 DIAGNOSIS — G4733 Obstructive sleep apnea (adult) (pediatric): Secondary | ICD-10-CM | POA: Insufficient documentation

## 2020-09-09 DIAGNOSIS — E785 Hyperlipidemia, unspecified: Secondary | ICD-10-CM | POA: Diagnosis not present

## 2020-09-09 DIAGNOSIS — M19032 Primary osteoarthritis, left wrist: Secondary | ICD-10-CM | POA: Diagnosis not present

## 2020-09-09 DIAGNOSIS — G4730 Sleep apnea, unspecified: Secondary | ICD-10-CM | POA: Insufficient documentation

## 2020-09-09 DIAGNOSIS — Z6836 Body mass index (BMI) 36.0-36.9, adult: Secondary | ICD-10-CM | POA: Diagnosis not present

## 2020-09-09 DIAGNOSIS — M19041 Primary osteoarthritis, right hand: Secondary | ICD-10-CM | POA: Insufficient documentation

## 2020-09-09 DIAGNOSIS — E669 Obesity, unspecified: Secondary | ICD-10-CM | POA: Diagnosis not present

## 2020-09-09 DIAGNOSIS — K621 Rectal polyp: Secondary | ICD-10-CM | POA: Diagnosis not present

## 2020-09-09 DIAGNOSIS — Z79899 Other long term (current) drug therapy: Secondary | ICD-10-CM | POA: Insufficient documentation

## 2020-09-09 DIAGNOSIS — Z950 Presence of cardiac pacemaker: Secondary | ICD-10-CM | POA: Insufficient documentation

## 2020-09-09 DIAGNOSIS — N183 Chronic kidney disease, stage 3 unspecified: Secondary | ICD-10-CM | POA: Diagnosis not present

## 2020-09-09 DIAGNOSIS — D120 Benign neoplasm of cecum: Secondary | ICD-10-CM | POA: Diagnosis not present

## 2020-09-09 DIAGNOSIS — M19042 Primary osteoarthritis, left hand: Secondary | ICD-10-CM | POA: Diagnosis not present

## 2020-09-09 DIAGNOSIS — M19031 Primary osteoarthritis, right wrist: Secondary | ICD-10-CM | POA: Insufficient documentation

## 2020-09-09 DIAGNOSIS — K5730 Diverticulosis of large intestine without perforation or abscess without bleeding: Secondary | ICD-10-CM | POA: Diagnosis not present

## 2020-09-09 DIAGNOSIS — E1122 Type 2 diabetes mellitus with diabetic chronic kidney disease: Secondary | ICD-10-CM | POA: Diagnosis not present

## 2020-09-09 HISTORY — PX: COLONOSCOPY WITH PROPOFOL: SHX5780

## 2020-09-09 LAB — GLUCOSE, CAPILLARY: Glucose-Capillary: 160 mg/dL — ABNORMAL HIGH (ref 70–99)

## 2020-09-09 SURGERY — COLONOSCOPY WITH PROPOFOL
Anesthesia: General

## 2020-09-09 MED ORDER — PROPOFOL 500 MG/50ML IV EMUL
INTRAVENOUS | Status: DC | PRN
  Administered 2020-09-09: 100 ug/kg/min via INTRAVENOUS

## 2020-09-09 MED ORDER — SODIUM CHLORIDE 0.9 % IV SOLN
INTRAVENOUS | Status: DC
  Administered 2020-09-09: 20 mL/h via INTRAVENOUS

## 2020-09-09 MED ORDER — LIDOCAINE HCL (CARDIAC) PF 100 MG/5ML IV SOSY
PREFILLED_SYRINGE | INTRAVENOUS | Status: DC | PRN
  Administered 2020-09-09: 50 mg via INTRAVENOUS

## 2020-09-09 MED ORDER — LIDOCAINE HCL (PF) 2 % IJ SOLN
INTRAMUSCULAR | Status: AC
  Filled 2020-09-09: qty 5

## 2020-09-09 MED ORDER — PROPOFOL 10 MG/ML IV BOLUS
INTRAVENOUS | Status: AC
  Filled 2020-09-09: qty 40

## 2020-09-09 MED ORDER — PROPOFOL 10 MG/ML IV BOLUS
INTRAVENOUS | Status: DC | PRN
  Administered 2020-09-09: 90 mg via INTRAVENOUS

## 2020-09-09 NOTE — Transfer of Care (Signed)
Immediate Anesthesia Transfer of Care Note  Patient: Anthony Palmer  Procedure(s) Performed: COLONOSCOPY WITH PROPOFOL (N/A )  Patient Location: PACU  Anesthesia Type:MAC  Level of Consciousness: awake and drowsy  Airway & Oxygen Therapy: Patient Spontanous Breathing  Post-op Assessment: Report given to RN and Post -op Vital signs reviewed and stable  Post vital signs: stable  Last Vitals:  Vitals Value Taken Time  BP 108/78 09/09/20 1041  Temp    Pulse 78 09/09/20 1043  Resp 22 09/09/20 1043  SpO2 98 % 09/09/20 1043  Vitals shown include unvalidated device data.  Last Pain:  Vitals:   09/09/20 0926  TempSrc: Temporal  PainSc: 0-No pain         Complications: No complications documented.

## 2020-09-09 NOTE — Interval H&P Note (Signed)
History and Physical Interval Note:  09/09/2020 9:53 AM  Anthony Palmer  has presented today for surgery, with the diagnosis of PERSONAL HX.OF COLON POLYPS.  The various methods of treatment have been discussed with the patient and family. After consideration of risks, benefits and other options for treatment, the patient has consented to  Procedure(s): COLONOSCOPY WITH PROPOFOL (N/A) as a surgical intervention.  The patient's history has been reviewed, patient examined, no change in status, stable for surgery.  I have reviewed the patient's chart and labs.  Questions were answered to the patient's satisfaction.     [name]  Ok to proceed with colonoscopy

## 2020-09-09 NOTE — H&P (Signed)
Outpatient short stay form Pre-procedure 09/09/2020 9:50 AM Anthony Miyamoto MD, MPH  Primary Physician:  Dr. Kary Palmer  Reason for visit:  Surveillance Colonoscopy  History of present illness:   69 y/o gentleman with history of advanced adenoma here for surveillance colonoscopy. No abdominal surgeries. No definite history of GI malignancies. Grandmother with some sort of cancer. No blood thinners. Has pacemaker.    Current Facility-Administered Medications:  .  0.9 %  sodium chloride infusion, , Intravenous, Continuous, Anthony Palmer, Anthony Cork, MD, Last Rate: 20 mL/hr at 09/09/20 0949, Continued from Pre-op at 09/09/20 0949  Medications Prior to Admission  Medication Sig Dispense Refill Last Dose  . allopurinol (ZYLOPRIM) 300 MG tablet Take 300 mg by mouth daily.   Past Week at Unknown time  . chlorthalidone (HYGROTON) 25 MG tablet TAKE 1 TABLET BY MOUTH EVERY DAY IN THE MORNING  11 Past Week at Unknown time  . Cholecalciferol 50 MCG (2000 UT) TABS Take 2,000 Units by mouth daily.   Past Week at Unknown time  . fluticasone (FLONASE) 50 MCG/ACT nasal spray Place 1 spray into both nostrils in the morning and at bedtime.   Past Week at Unknown time  . glipiZIDE (GLUCOTROL XL) 2.5 MG 24 hr tablet Take 2.5 mg by mouth daily with breakfast.   Past Week at Unknown time  . losartan (COZAAR) 25 MG tablet Take 25 mg by mouth daily.   Past Week at Unknown time  . lovastatin (MEVACOR) 40 MG tablet Take 40 mg by mouth at bedtime.   Past Week at Unknown time  . Propylene Glycol (SYSTANE COMPLETE) 0.6 % SOLN Apply 1 drop to eye daily.   Past Week at Unknown time     No Known Allergies   Past Medical History:  Diagnosis Date  . Arthritis    "hands, wrists" (10/16/2017)  . Bilateral hydronephrosis    a. 08/2017 in setting of bladder outlet obstruction.  . Bladder outlet obstruction    a. 08/2017 noted in setting of rising creatinine-->d/c'd w/ foley and plan for outpt urology f/u.  [name] Childhood asthma     "outgrew it"  . CKD (chronic kidney disease), stage III   . Gout    "on daily RX" (10/16/2017)  . Hyperlipidemia   . Hyperlipidemia   . Hypertension   . OSA on CPAP   . Presence of permanent cardiac pacemaker 10/16/2017  . Self-catheterizes urinary bladder   . Skin cancer    "back" (10/16/2017)  . Syncope    a. 08/2017 Echo: EF 60-65%, no rwma, Gr1 DD, nl RV fxn; b. 08/2017 carotid u/s: unremarkable.  . Trifascicular block   . Trigger finger, acquired   . Type II diabetes mellitus ([place])     Review of systems:  Otherwise negative.    Physical Exam  Gen: Alert, oriented. Appears stated age.  HEENT: [place]/AT. PERRLA. Lungs: No respiratory distress Abd: soft, benign, no masses. Ext: No edema. Pulses 2+    Planned procedures: Proceed with colonoscopy. The patient understands the nature of the planned procedure, indications, risks, alternatives and potential complications including but not limited to bleeding, infection, perforation, damage to internal organs and possible oversedation/side effects from anesthesia. The patient agrees and gives consent to proceed.  Please refer to procedure notes for findings, recommendations and patient disposition/instructions.     Anthony Miyamoto MD, MPH Gastroenterology 09/09/2020  9:50 AM

## 2020-09-09 NOTE — Anesthesia Preprocedure Evaluation (Addendum)
Anesthesia Evaluation  Patient identified by MRN, date of birth, ID band Patient awake    Reviewed: Allergy & Precautions, H&P , NPO status , Patient's Chart, lab work & pertinent test results  History of Anesthesia Complications Negative for: history of anesthetic complications  Airway Mallampati: II  TM Distance: >3 FB     Dental  (+) Chipped   Pulmonary asthma , sleep apnea , neg COPD,    breath sounds clear to auscultation       Cardiovascular hypertension, (-) angina(-) Past MI and (-) Cardiac Stents + dysrhythmias (h/o trifascicular block) + pacemaker  Rhythm:regular Rate:Normal     Neuro/Psych negative neurological ROS  negative psych ROS   GI/Hepatic negative GI ROS, Neg liver ROS,   Endo/Other  diabetes  Renal/GU Renal disease (CKD)  negative genitourinary   Musculoskeletal   Abdominal   Peds  Hematology negative hematology ROS (+)   Anesthesia Other Findings Abdominal obesity Pacemaker  Past Medical History: No date: Arthritis     Comment:  "hands, wrists" (10/16/2017) No date: Bilateral hydronephrosis     Comment:  a. 08/2017 in setting of bladder outlet obstruction. No date: Bladder outlet obstruction     Comment:  a. 08/2017 noted in setting of rising creatinine-->d/c'd               w/ foley and plan for outpt urology f/u. No date: Childhood asthma     Comment:  "outgrew it" No date: CKD (chronic kidney disease), stage III No date: Gout     Comment:  "on daily RX" (10/16/2017) No date: Hyperlipidemia No date: Hyperlipidemia No date: Hypertension No date: OSA on CPAP 10/16/2017: Presence of permanent cardiac pacemaker No date: Self-catheterizes urinary bladder No date: Skin cancer     Comment:  "back" (10/16/2017) No date: Syncope     Comment:  a. 08/2017 Echo: EF 60-65%, no rwma, Gr1 DD, nl RV fxn;               b. 08/2017 carotid u/s: unremarkable. No date: Trifascicular block No date:  Trigger finger, acquired No date: Type II diabetes mellitus ([place])  Past Surgical History: 07/23/2016: COLONOSCOPY WITH PROPOFOL; N/A     Comment:  Procedure: COLONOSCOPY WITH PROPOFOL;  Surgeon: Lollie Sails, MD;  Location: [facility] ENDOSCOPY;  Service:               Endoscopy;  Laterality: N/A; 11/11/2017: CYSTOSCOPY W/ RETROGRADES; Bilateral     Comment:  Procedure: CYSTOSCOPY WITH RETROGRADE PYELOGRAM;                Surgeon: Hollice Espy, MD;  Location: [facility] ORS;                Service: Urology;  Laterality: Bilateral; 11/13/2017: CYSTOSCOPY WITH FULGERATION; N/A     Comment:  Procedure: CYSTOSCOPY WITH FULGERATION;  Surgeon:               Hollice Espy, MD;  Location: [facility] ORS;  Service:               Urology;  Laterality: N/A; 2013: INCISION AND DRAINAGE; Left     Comment:  Hematoma left leg 10/16/2017: INSERT / REPLACE / REMOVE PACEMAKER 10/16/2017: PACEMAKER IMPLANT; N/A     Comment:  Procedure: PACEMAKER IMPLANT;  Surgeon: Deboraha Sprang,              MD;  Location: [facility]  INVASIVE CV LAB;  Service:               Cardiovascular;  Laterality: N/A; No date: SKIN CANCER EXCISION     Comment:  "off my back" 11/11/2017: TRANSURETHRAL RESECTION OF PROSTATE; N/A     Comment:  Procedure: TRANSURETHRAL RESECTION OF THE PROSTATE               (TURP);  Surgeon: Hollice Espy, MD;  Location: [facility]               ORS;  Service: Urology;  Laterality: N/A;  BMI    Body Mass Index: 36.19 kg/m      Reproductive/Obstetrics negative OB ROS                            Anesthesia Physical Anesthesia Plan  ASA: III  Anesthesia Plan: General   Post-op Pain Management:    Induction:   PONV Risk Score and Plan: Propofol infusion and TIVA  Airway Management Planned: Simple Face Mask  Additional Equipment:   Intra-op Plan:   Post-operative Plan:   Informed Consent: I have reviewed the patients History and Physical, chart, labs and  discussed the procedure including the risks, benefits and alternatives for the proposed anesthesia with the patient or authorized representative who has indicated his/her understanding and acceptance.     Dental Advisory Given  Plan Discussed with: Anesthesiologist, CRNA and Surgeon  Anesthesia Plan Comments:        Anesthesia Quick Evaluation

## 2020-09-09 NOTE — Op Note (Addendum)
[facility] Gastroenterology Patient Name: Anthony Palmer Procedure Date: 09/09/2020 9:43 AM MRN: [id number] Account #: [id number] Date of Birth: 21-Oct-1951 Admit Type: Outpatient Age: 69 Room: [facility] ENDO ROOM 3 Gender: Male Note Status: Finalized Procedure:             Colonoscopy Indications:           High risk colon cancer surveillance: Personal history                         of colonic polyps Providers:             Andrey Farmer MD, MD Referring MD:          Irven Easterly. Kary Kos, MD (Referring MD) Medicines:             Monitored Anesthesia Care Complications:         No immediate complications. Estimated blood loss:                         Minimal. Procedure:             Pre-Anesthesia Assessment:                        - Prior to the procedure, a History and Physical was                         performed, and patient medications and allergies were                         reviewed. The patient is competent. The risks and                         benefits of the procedure and the sedation options and                         risks were discussed with the patient. All questions                         were answered and informed consent was obtained.                         Patient identification and proposed procedure were                         verified by the physician, the nurse, the anesthetist                         and the technician in the endoscopy suite. Mental                         Status Examination: alert and oriented. Airway                         Examination: normal oropharyngeal airway and neck                         mobility. Respiratory Examination: clear to  auscultation. CV Examination: normal. Prophylactic                         Antibiotics: The patient does not require prophylactic                         antibiotics. Prior Anticoagulants: The patient has                         taken no previous anticoagulant or  antiplatelet                         agents. ASA Grade Assessment: II - A patient with mild                         systemic disease. After reviewing the risks and                         benefits, the patient was deemed in satisfactory                         condition to undergo the procedure. The anesthesia                         plan was to use monitored anesthesia care (MAC).                         Immediately prior to administration of medications,                         the patient was re-assessed for adequacy to receive                         sedatives. The heart rate, respiratory rate, oxygen                         saturations, blood pressure, adequacy of pulmonary                         ventilation, and response to care were monitored                         throughout the procedure. The physical status of the                         patient was re-assessed after the procedure.                        After obtaining informed consent, the colonoscope was                         passed under direct vision. Throughout the procedure,                         the patient's blood pressure, pulse, and oxygen                         saturations were monitored continuously. The  Colonoscope was introduced through the anus and                         advanced to the the terminal ileum. The colonoscopy                         was technically difficult and complex due to a                         redundant colon and significant looping. Successful                         completion of the procedure was aided by using manual                         pressure. The patient tolerated the procedure well.                         The quality of the bowel preparation was fair except                         the ascending colon was poor. Findings:      The terminal ileum appeared normal.      The perianal and digital rectal examinations were normal.      The colon (entire  examined portion) revealed moderately excessive       looping.      The ileocecal valve was moderately lipomatous. Biopsies were taken with       a cold forceps for histology. Estimated blood loss was minimal.      A 1 mm polyp was found in the cecum. The polyp was sessile. The polyp       was removed with a jumbo cold forceps. Resection and retrieval were       complete. Estimated blood loss was minimal. IC valve was extremely       prominent requiring pulling back of the valve to locate this polyp.      A 1 mm polyp was found in the rectum. The polyp was sessile. The polyp       was removed with a jumbo cold forceps. Resection and retrieval were       complete. Estimated blood loss was minimal.      Many small and large-mouthed diverticula were found in the sigmoid       colon, descending colon, transverse colon, ascending colon and cecum.      The mucosa vascular pattern in the rectum was locally increased as       evidenced by prominent rectal veins.      The exam was otherwise without abnormality on direct and retroflexion       views. Impression:            - The examined portion of the ileum was normal.                        - There was significant looping of the colon.                        - Lipomatous ileocecal valve. Biopsied.                        -  One 1 mm polyp in the cecum, removed with a jumbo                         cold forceps. Resected and retrieved.                        - One 1 mm polyp in the rectum, removed with a jumbo                         cold forceps. Resected and retrieved.                        - Diverticulosis in the sigmoid colon, in the                         descending colon, in the transverse colon, in the                         ascending colon and in the cecum.                        - Increased mucosa vascular pattern in the rectum.                        - The examination was otherwise normal on direct and                          retroflexion views. Recommendation:        - Discharge patient to home.                        - Resume previous diet.                        - Continue present medications.                        - Await pathology results.                        - Repeat colonoscopy in 1 year because the bowel                         preparation was suboptimal.                        - Return to referring physician as previously                         scheduled. Procedure Code(s):     --- Professional ---                        [phone], Colonoscopy, flexible; with biopsy, single or                         multiple Diagnosis Code(s):     --- Professional ---                        Z86.010, Personal history of colonic polyps  K63.89, Other specified diseases of intestine                        K63.5, Polyp of colon                        K62.1, Rectal polyp                        K57.30, Diverticulosis of large intestine without                         perforation or abscess without bleeding CPT copyright 2019 American Medical Association. All rights reserved. The codes documented in this report are preliminary and upon coder review may  be revised to meet current compliance requirements. Andrey Farmer, MD Andrey Farmer MD, MD 09/09/2020 10:59:26 AM Number of Addenda: 0 Note Initiated On: 09/09/2020 9:43 AM Scope Withdrawal Time: 0 hours 25 minutes 57 seconds  Total Procedure Duration: 0 hours 41 minutes 45 seconds  Estimated Blood Loss:  Estimated blood loss was minimal.      [facility]

## 2020-09-09 NOTE — Anesthesia Postprocedure Evaluation (Signed)
Anesthesia Post Note  Patient: Anthony Palmer  Procedure(s) Performed: COLONOSCOPY WITH PROPOFOL (N/A )  Patient location during evaluation: PACU Anesthesia Type: General Level of consciousness: awake and alert Pain management: pain level controlled Vital Signs Assessment: post-procedure vital signs reviewed and stable Respiratory status: spontaneous breathing, nonlabored ventilation and respiratory function stable Cardiovascular status: blood pressure returned to baseline and stable Postop Assessment: no apparent nausea or vomiting Anesthetic complications: no   No complications documented.   Last Vitals:  Vitals:   09/09/20 1100 09/09/20 1106  BP: 124/85   Pulse: (!) 59 (!) 59  Resp: (!) 23 10  Temp:    SpO2: 99% 98%    Last Pain:  Vitals:   09/09/20 0926  TempSrc: Temporal  PainSc: 0-No pain                 Brett Canales Rona Tomson

## 2020-09-12 LAB — SURGICAL PATHOLOGY

## 2020-09-13 ENCOUNTER — Encounter: Payer: Medicare Other | Admitting: Internal Medicine

## 2020-09-20 ENCOUNTER — Ambulatory Visit (INDEPENDENT_AMBULATORY_CARE_PROVIDER_SITE_OTHER): Payer: Medicare Other | Admitting: Internal Medicine

## 2020-09-20 ENCOUNTER — Encounter: Payer: Self-pay | Admitting: Internal Medicine

## 2020-09-20 ENCOUNTER — Other Ambulatory Visit: Payer: Self-pay

## 2020-09-20 VITALS — BP 134/88 | HR 60 | Ht 68.0 in | Wt 242.2 lb

## 2020-09-20 DIAGNOSIS — Z950 Presence of cardiac pacemaker: Secondary | ICD-10-CM | POA: Diagnosis not present

## 2020-09-20 DIAGNOSIS — R55 Syncope and collapse: Secondary | ICD-10-CM | POA: Diagnosis not present

## 2020-09-20 DIAGNOSIS — I452 Bifascicular block: Secondary | ICD-10-CM | POA: Diagnosis not present

## 2020-09-20 DIAGNOSIS — I453 Trifascicular block: Secondary | ICD-10-CM | POA: Diagnosis not present

## 2020-09-20 NOTE — Patient Instructions (Signed)
Medication Instructions:  - Your physician recommends that you continue on your current medications as directed. Please refer to the Current Medication list given to you today.  *If you need a refill on your cardiac medications before your next appointment, please call your pharmacy*   Lab Work: - none ordered  If you have labs (blood work) drawn today and your tests are completely normal, you will receive your results only by: . MyChart Message (if you have MyChart) OR . A paper copy in the mail If you have any lab test that is abnormal or we need to change your treatment, we will call you to review the results.   Testing/Procedures: - none ordered   Follow-Up: At [facility], you and your health needs are our priority.  As part of our continuing mission to provide you with exceptional heart care, we have created designated Provider Care Teams.  These Care Teams include your primary Cardiologist (physician) and Advanced Practice Providers (APPs -  Physician Assistants and Nurse Practitioners) who all work together to provide you with the care you need, when you need it.  We recommend signing up for the patient portal called "MyChart".  Sign up information is provided on this After Visit Summary.  MyChart is used to connect with patients for Virtual Visits (Telemedicine).  Patients are able to view lab/test results, encounter notes, upcoming appointments, etc.  Non-urgent messages can be sent to your provider as well.   To learn more about what you can do with MyChart, go to [email or web address].    Your next appointment:   1 year(s)  The format for your next appointment:   In Person  Provider:   Binyamin Klein, MD   Other Instructions n/a  

## 2020-09-20 NOTE — Progress Notes (Signed)
Patient Care Team: [place] Pink, MD as PCP - General (Family Medicine)   HPI  Anthony Palmer is a 69 y.o. male Seen in followup for syncope and trifascicular block for which he underwent His bundle pacing with septal lead location 10/18.  No interval syncope  The patient denies chest pain, shortness of breath, nocturnal dyspnea, orthopnea but has mild peripheral edema.  There have been no palpitations, lightheadedness or syncope.    He has a history of hypertension diabetes chronic kidney disease.  Echocardiogram 10/18-normal LV function  Date Cr K  HgbA1c  10/18 1.8 5.2 6.1  8/19 1.3 3.8 7.3  9/21 1.4 4.4 7.3     The patient denies chest pain,  nocturnal dyspnea, orthopnea or peripheral edema.  There have been no palpitations, lightheadedness or syncope.  Mild dyspnea On CPAP, not sure if it is read  Weight continues to be an issue and HgbA1c    Past Medical History:  Diagnosis Date  . Arthritis    "hands, wrists" (10/16/2017)  . Bilateral hydronephrosis    a. 08/2017 in setting of bladder outlet obstruction.  . Bladder outlet obstruction    a. 08/2017 noted in setting of rising creatinine-->d/c'd w/ foley and plan for outpt urology f/u.  [name] Childhood asthma    "outgrew it"  . CKD (chronic kidney disease), stage III   . Gout    "on daily RX" (10/16/2017)  . Hyperlipidemia   . Hyperlipidemia   . Hypertension   . OSA on CPAP   . Presence of permanent cardiac pacemaker 10/16/2017  . Self-catheterizes urinary bladder   . Skin cancer    "back" (10/16/2017)  . Syncope    a. 08/2017 Echo: EF 60-65%, no rwma, Gr1 DD, nl RV fxn; b. 08/2017 carotid u/s: unremarkable.  . Trifascicular block   . Trigger finger, acquired   . Type II diabetes mellitus ([place])     Past Surgical History:  Procedure Laterality Date  . COLONOSCOPY WITH PROPOFOL N/A 07/23/2016   Procedure: COLONOSCOPY WITH PROPOFOL;  Surgeon: Lollie Sails, MD;  Location: [facility] ENDOSCOPY;  Service:  Endoscopy;  Laterality: N/A;  . COLONOSCOPY WITH PROPOFOL N/A 09/09/2020   Procedure: COLONOSCOPY WITH PROPOFOL;  Surgeon: [name], MD;  Location: [facility] ENDOSCOPY;  Service: Gastroenterology;  Laterality: N/A;  . CYSTOSCOPY W/ RETROGRADES Bilateral 11/11/2017   Procedure: CYSTOSCOPY WITH RETROGRADE PYELOGRAM;  Surgeon: Hollice Espy, MD;  Location: [facility] ORS;  Service: Urology;  Laterality: Bilateral;  . CYSTOSCOPY WITH FULGERATION N/A 11/13/2017   Procedure: CYSTOSCOPY WITH FULGERATION;  Surgeon: Hollice Espy, MD;  Location: [facility] ORS;  Service: Urology;  Laterality: N/A;  . INCISION AND DRAINAGE Left 2013   Hematoma left leg  . INSERT / REPLACE / REMOVE PACEMAKER  10/16/2017  . PACEMAKER IMPLANT N/A 10/16/2017   Procedure: PACEMAKER IMPLANT;  Surgeon: Deboraha Sprang, MD;  Location: [place] CV LAB;  Service: Cardiovascular;  Laterality: N/A;  . SKIN CANCER EXCISION     "off my back"  . TRANSURETHRAL RESECTION OF PROSTATE N/A 11/11/2017   Procedure: TRANSURETHRAL RESECTION OF THE PROSTATE (TURP);  Surgeon: Hollice Espy, MD;  Location: [facility] ORS;  Service: Urology;  Laterality: N/A;    Current Outpatient Medications  Medication Sig Dispense Refill  . allopurinol (ZYLOPRIM) 300 MG tablet Take 300 mg by mouth daily.    . chlorthalidone (HYGROTON) 25 MG tablet TAKE 1 TABLET BY MOUTH EVERY DAY IN THE MORNING  11  . Cholecalciferol 50  MCG (2000 UT) TABS Take 2,000 Units by mouth daily.    . fluticasone (FLONASE) 50 MCG/ACT nasal spray Place 1 spray into both nostrils in the morning and at bedtime.    [name] glipiZIDE (GLUCOTROL XL) 2.5 MG 24 hr tablet Take 2.5 mg by mouth daily with breakfast.    . losartan (COZAAR) 25 MG tablet Take 25 mg by mouth daily.    [name] lovastatin (MEVACOR) 40 MG tablet Take 40 mg by mouth at bedtime.    [name] Propylene Glycol (SYSTANE COMPLETE) 0.6 % SOLN Apply 1 drop to eye daily.     No current facility-administered medications for this visit.    No  Known Allergies    Review of Systems negative except from HPI and PMH  Physical Exam BP 134/88 (BP Location: Left Arm, Patient Position: Sitting, Cuff Size: Large)   Pulse 60   Ht 5\' 8"  (1.727 m)   Wt 242 lb 4 oz (109.9 kg)   SpO2 98%   BMI 36.83 kg/m  Well developed and well nourished in no acute distress HENT normal Neck supple with JVP-flat Clear Device pocket well healed; without hematoma or erythema.  There is no tethering  Regular rate and rhythm, no  murmur Abd-soft with active BS No Clubbing cyanosis  edema Skin-warm and dry A & Oriented  Grossly normal sensory and motor function  ECG Apacing @ 60 25/13/43   Assessment and  Plan  Syncope-recurrent  Trifascicular block  Pacemaker-Medtronic The patient's device was interrogated.  The information was reviewed. No changes were made in the programming.    Hypertension  Diabetes  BP well controlled  HgbA1c is elevated encouraged diet and exercise   No syncope

## 2020-09-22 LAB — CUP PACEART INCLINIC DEVICE CHECK
Battery Remaining Longevity: 142 mo
Battery Voltage: 3.02 V
Brady Statistic AP VP Percent: 0.05 %
Brady Statistic AP VS Percent: 26.32 %
Brady Statistic AS VP Percent: 0.05 %
Brady Statistic AS VS Percent: 73.58 %
Brady Statistic RA Percent Paced: 26.49 %
Brady Statistic RV Percent Paced: 0.1 %
Date Time Interrogation Session: 20210928110200
Implantable Lead Implant Date: 20181024
Implantable Lead Implant Date: 20181024
Implantable Lead Location: 753859
Implantable Lead Location: 753860
Implantable Lead Model: 3830
Implantable Lead Model: 5076
Implantable Pulse Generator Implant Date: 20181024
Lead Channel Impedance Value: 361 Ohm
Lead Channel Impedance Value: 399 Ohm
Lead Channel Impedance Value: 437 Ohm
Lead Channel Impedance Value: 456 Ohm
Lead Channel Pacing Threshold Amplitude: 0.5 V
Lead Channel Pacing Threshold Amplitude: 0.75 V
Lead Channel Pacing Threshold Pulse Width: 0.4 ms
Lead Channel Pacing Threshold Pulse Width: 0.4 ms
Lead Channel Sensing Intrinsic Amplitude: 4.125 mV
Lead Channel Sensing Intrinsic Amplitude: 4.5 mV
Lead Channel Setting Pacing Amplitude: 2 V
Lead Channel Setting Pacing Amplitude: 2.5 V
Lead Channel Setting Pacing Pulse Width: 1 ms
Lead Channel Setting Sensing Sensitivity: 0.9 mV

## 2020-10-11 ENCOUNTER — Ambulatory Visit: Payer: Medicare Other | Attending: Internal Medicine

## 2020-10-11 DIAGNOSIS — Z23 Encounter for immunization: Secondary | ICD-10-CM

## 2020-10-11 NOTE — Progress Notes (Signed)
   Covid-19 Vaccination Clinic  Name:  JEOVANY HUITRON    MRN: [id number] DOB: Apr 28, 1951  10/11/2020  Mr. Blanchette was observed post Covid-19 immunization for 15 minutes without incident. He was provided with Vaccine Information Sheet and instruction to access the V-Safe system.   Mr. Sereno was instructed to call 911 with any severe reactions post vaccine: [name] Difficulty breathing  . Swelling of face and throat  . A fast heartbeat  . A bad rash all over body  . Dizziness and weakness

## 2020-10-12 ENCOUNTER — Other Ambulatory Visit: Payer: Self-pay | Admitting: Internal Medicine

## 2020-10-18 ENCOUNTER — Ambulatory Visit (INDEPENDENT_AMBULATORY_CARE_PROVIDER_SITE_OTHER): Payer: Medicare Other

## 2020-10-18 DIAGNOSIS — R001 Bradycardia, unspecified: Secondary | ICD-10-CM

## 2020-10-18 LAB — CUP PACEART REMOTE DEVICE CHECK
Battery Remaining Longevity: 141 mo
Battery Voltage: 3.02 V
Brady Statistic AP VP Percent: 0.03 %
Brady Statistic AP VS Percent: 26.2 %
Brady Statistic AS VP Percent: 0.05 %
Brady Statistic AS VS Percent: 73.73 %
Brady Statistic RA Percent Paced: 26.32 %
Brady Statistic RV Percent Paced: 0.08 %
Date Time Interrogation Session: 20211026002947
Implantable Lead Implant Date: 20181024
Implantable Lead Implant Date: 20181024
Implantable Lead Location: 753859
Implantable Lead Location: 753860
Implantable Lead Model: 3830
Implantable Lead Model: 5076
Implantable Pulse Generator Implant Date: 20181024
Lead Channel Impedance Value: 361 Ohm
Lead Channel Impedance Value: 380 Ohm
Lead Channel Impedance Value: 418 Ohm
Lead Channel Impedance Value: 437 Ohm
Lead Channel Pacing Threshold Amplitude: 0.5 V
Lead Channel Pacing Threshold Amplitude: 1.125 V
Lead Channel Pacing Threshold Pulse Width: 0.4 ms
Lead Channel Pacing Threshold Pulse Width: 0.4 ms
Lead Channel Sensing Intrinsic Amplitude: 4.375 mV
Lead Channel Sensing Intrinsic Amplitude: 4.375 mV
Lead Channel Sensing Intrinsic Amplitude: 5.25 mV
Lead Channel Sensing Intrinsic Amplitude: 5.25 mV
Lead Channel Setting Pacing Amplitude: 2 V
Lead Channel Setting Pacing Amplitude: 2.5 V
Lead Channel Setting Pacing Pulse Width: 1 ms
Lead Channel Setting Sensing Sensitivity: 0.9 mV

## 2020-10-24 NOTE — Progress Notes (Signed)
Remote pacemaker transmission.   

## 2021-01-18 ENCOUNTER — Ambulatory Visit (INDEPENDENT_AMBULATORY_CARE_PROVIDER_SITE_OTHER): Payer: Medicare Other

## 2021-01-18 DIAGNOSIS — I453 Trifascicular block: Secondary | ICD-10-CM

## 2021-01-18 LAB — CUP PACEART REMOTE DEVICE CHECK
Battery Remaining Longevity: 138 mo
Battery Voltage: 3.02 V
Brady Statistic AP VP Percent: 0.01 %
Brady Statistic AP VS Percent: 26.86 %
Brady Statistic AS VP Percent: 0.05 %
Brady Statistic AS VS Percent: 73.09 %
Brady Statistic RA Percent Paced: 26.96 %
Brady Statistic RV Percent Paced: 0.06 %
Date Time Interrogation Session: 20220125233444
Implantable Lead Implant Date: 20181024
Implantable Lead Implant Date: 20181024
Implantable Lead Location: 753859
Implantable Lead Location: 753860
Implantable Lead Model: 3830
Implantable Lead Model: 5076
Implantable Pulse Generator Implant Date: 20181024
Lead Channel Impedance Value: 342 Ohm
Lead Channel Impedance Value: 380 Ohm
Lead Channel Impedance Value: 418 Ohm
Lead Channel Impedance Value: 437 Ohm
Lead Channel Pacing Threshold Amplitude: 0.5 V
Lead Channel Pacing Threshold Amplitude: 0.875 V
Lead Channel Pacing Threshold Pulse Width: 0.4 ms
Lead Channel Pacing Threshold Pulse Width: 0.4 ms
Lead Channel Sensing Intrinsic Amplitude: 3.625 mV
Lead Channel Sensing Intrinsic Amplitude: 3.625 mV
Lead Channel Sensing Intrinsic Amplitude: 4.875 mV
Lead Channel Sensing Intrinsic Amplitude: 4.875 mV
Lead Channel Setting Pacing Amplitude: 2 V
Lead Channel Setting Pacing Amplitude: 2.5 V
Lead Channel Setting Pacing Pulse Width: 1 ms
Lead Channel Setting Sensing Sensitivity: 0.9 mV

## 2021-01-30 NOTE — Progress Notes (Signed)
Remote pacemaker transmission.   

## 2021-04-19 ENCOUNTER — Ambulatory Visit (INDEPENDENT_AMBULATORY_CARE_PROVIDER_SITE_OTHER): Payer: Medicare Other

## 2021-04-19 DIAGNOSIS — I453 Trifascicular block: Secondary | ICD-10-CM

## 2021-04-19 LAB — CUP PACEART REMOTE DEVICE CHECK
Battery Remaining Longevity: 135 mo
Battery Voltage: 3.02 V
Brady Statistic AP VP Percent: 0.02 %
Brady Statistic AP VS Percent: 30.03 %
Brady Statistic AS VP Percent: 0.06 %
Brady Statistic AS VS Percent: 69.9 %
Brady Statistic RA Percent Paced: 30.14 %
Brady Statistic RV Percent Paced: 0.07 %
Date Time Interrogation Session: 20220427002937
Implantable Lead Implant Date: 20181024
Implantable Lead Implant Date: 20181024
Implantable Lead Location: 753859
Implantable Lead Location: 753860
Implantable Lead Model: 3830
Implantable Lead Model: 5076
Implantable Pulse Generator Implant Date: 20181024
Lead Channel Impedance Value: 361 Ohm
Lead Channel Impedance Value: 380 Ohm
Lead Channel Impedance Value: 418 Ohm
Lead Channel Impedance Value: 437 Ohm
Lead Channel Pacing Threshold Amplitude: 0.5 V
Lead Channel Pacing Threshold Amplitude: 1 V
Lead Channel Pacing Threshold Pulse Width: 0.4 ms
Lead Channel Pacing Threshold Pulse Width: 0.4 ms
Lead Channel Sensing Intrinsic Amplitude: 5.25 mV
Lead Channel Sensing Intrinsic Amplitude: 5.25 mV
Lead Channel Sensing Intrinsic Amplitude: 5.75 mV
Lead Channel Sensing Intrinsic Amplitude: 5.75 mV
Lead Channel Setting Pacing Amplitude: 2 V
Lead Channel Setting Pacing Amplitude: 2.5 V
Lead Channel Setting Pacing Pulse Width: 1 ms
Lead Channel Setting Sensing Sensitivity: 0.9 mV

## 2021-05-10 NOTE — Progress Notes (Signed)
Remote pacemaker transmission.   

## 2021-07-19 ENCOUNTER — Ambulatory Visit (INDEPENDENT_AMBULATORY_CARE_PROVIDER_SITE_OTHER): Payer: Medicare Other

## 2021-07-19 DIAGNOSIS — R001 Bradycardia, unspecified: Secondary | ICD-10-CM

## 2021-07-19 LAB — CUP PACEART REMOTE DEVICE CHECK
Battery Remaining Longevity: 132 mo
Battery Voltage: 3.02 V
Brady Statistic AP VP Percent: 0.06 %
Brady Statistic AP VS Percent: 30.87 %
Brady Statistic AS VP Percent: 0.05 %
Brady Statistic AS VS Percent: 69.02 %
Brady Statistic RA Percent Paced: 31.03 %
Brady Statistic RV Percent Paced: 0.11 %
Date Time Interrogation Session: 20220727003045
Implantable Lead Implant Date: 20181024
Implantable Lead Implant Date: 20181024
Implantable Lead Location: 753859
Implantable Lead Location: 753860
Implantable Lead Model: 3830
Implantable Lead Model: 5076
Implantable Pulse Generator Implant Date: 20181024
Lead Channel Impedance Value: 361 Ohm
Lead Channel Impedance Value: 361 Ohm
Lead Channel Impedance Value: 418 Ohm
Lead Channel Impedance Value: 437 Ohm
Lead Channel Pacing Threshold Amplitude: 0.5 V
Lead Channel Pacing Threshold Amplitude: 0.875 V
Lead Channel Pacing Threshold Pulse Width: 0.4 ms
Lead Channel Pacing Threshold Pulse Width: 0.4 ms
Lead Channel Sensing Intrinsic Amplitude: 3.875 mV
Lead Channel Sensing Intrinsic Amplitude: 3.875 mV
Lead Channel Sensing Intrinsic Amplitude: 4.875 mV
Lead Channel Sensing Intrinsic Amplitude: 4.875 mV
Lead Channel Setting Pacing Amplitude: 2 V
Lead Channel Setting Pacing Amplitude: 2.5 V
Lead Channel Setting Pacing Pulse Width: 1 ms
Lead Channel Setting Sensing Sensitivity: 0.9 mV

## 2021-08-14 NOTE — Progress Notes (Signed)
Remote pacemaker transmission.   

## 2021-09-12 ENCOUNTER — Other Ambulatory Visit: Payer: Self-pay

## 2021-09-19 ENCOUNTER — Encounter: Payer: Self-pay | Admitting: Internal Medicine

## 2021-09-19 ENCOUNTER — Other Ambulatory Visit: Payer: Self-pay

## 2021-09-19 ENCOUNTER — Ambulatory Visit (INDEPENDENT_AMBULATORY_CARE_PROVIDER_SITE_OTHER): Payer: Medicare Other | Admitting: Internal Medicine

## 2021-09-19 ENCOUNTER — Ambulatory Visit: Payer: Medicare Other | Attending: Internal Medicine

## 2021-09-19 VITALS — BP 120/80 | HR 61 | Ht 68.0 in | Wt 241.0 lb

## 2021-09-19 DIAGNOSIS — Z23 Encounter for immunization: Secondary | ICD-10-CM

## 2021-09-19 DIAGNOSIS — Z950 Presence of cardiac pacemaker: Secondary | ICD-10-CM | POA: Diagnosis not present

## 2021-09-19 DIAGNOSIS — R001 Bradycardia, unspecified: Secondary | ICD-10-CM | POA: Diagnosis not present

## 2021-09-19 DIAGNOSIS — I453 Trifascicular block: Secondary | ICD-10-CM | POA: Diagnosis not present

## 2021-09-19 LAB — PACEMAKER DEVICE OBSERVATION

## 2021-09-19 MED ORDER — PFIZER COVID-19 VAC BIVALENT 30 MCG/0.3ML IM SUSP
INTRAMUSCULAR | 0 refills | Status: DC
  Filled 2021-09-19: qty 0.3, 1d supply, fill #0

## 2021-09-19 NOTE — Progress Notes (Signed)
Patient Care Team: Maryland Pink, MD as PCP - General (Family Medicine)   HPI  Anthony Palmer is a 70 y.o. 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  The patient denies chest pain, shortness of breath, nocturnal dyspnea, orthopnea or peripheral edema.  There have been no palpitations, lightheadedness or syncope.   He has a history of hypertension diabetes chronic kidney disease.  Working on exercise but weight not changing   Echocardiogram 10/18-normal LV function  Date Cr K  HgbA1c  10/18 1.8 5.2 6.1  8/19 1.3 3.8 7.3  9/21 1.4 4.4 7.3    7/22 1.5 4.4 7.1    On CPAP,     Past Medical History:  Diagnosis Date   Arthritis    "hands, wrists" (10/16/2017)   Bilateral hydronephrosis    a. 08/2017 in setting of bladder outlet obstruction.   Bladder outlet obstruction    a. 08/2017 noted in setting of rising creatinine-->d/c'd w/ foley and plan for outpt urology f/u.   Childhood asthma    "outgrew it"   CKD (chronic kidney disease), stage III ([place])    Gout    "on daily RX" (10/16/2017)   Hyperlipidemia    Hyperlipidemia    Hypertension    OSA on CPAP    Presence of permanent cardiac pacemaker 10/16/2017   Self-catheterizes urinary bladder    Skin cancer    "back" (10/16/2017)   Syncope    a. 08/2017 Echo: EF 60-65%, no rwma, Gr1 DD, nl RV fxn; b. 08/2017 carotid u/s: unremarkable.   Trifascicular block    Trigger finger, acquired    Type II diabetes mellitus ([place])     Past Surgical History:  Procedure Laterality Date   COLONOSCOPY WITH PROPOFOL N/A 07/23/2016   Procedure: COLONOSCOPY WITH PROPOFOL;  Surgeon: Lollie Sails, MD;  Location: [facility] ENDOSCOPY;  Service: Endoscopy;  Laterality: N/A;   COLONOSCOPY WITH PROPOFOL N/A 09/09/2020   Procedure: COLONOSCOPY WITH PROPOFOL;  Surgeon: [name], MD;  Location: [facility] ENDOSCOPY;  Service: Gastroenterology;   Laterality: N/A;   CYSTOSCOPY W/ RETROGRADES Bilateral 11/11/2017   Procedure: CYSTOSCOPY WITH RETROGRADE PYELOGRAM;  Surgeon: Hollice Espy, MD;  Location: [facility] ORS;  Service: Urology;  Laterality: Bilateral;   CYSTOSCOPY WITH FULGERATION N/A 11/13/2017   Procedure: CYSTOSCOPY WITH FULGERATION;  Surgeon: Hollice Espy, MD;  Location: [facility] ORS;  Service: Urology;  Laterality: N/A;   INCISION AND DRAINAGE Left 2013   Hematoma left leg   INSERT / REPLACE / REMOVE PACEMAKER  10/16/2017   PACEMAKER IMPLANT N/A 10/16/2017   Procedure: PACEMAKER IMPLANT;  Surgeon: Deboraha Sprang, MD;  Location: [place] CV LAB;  Service: Cardiovascular;  Laterality: N/A;   SKIN CANCER EXCISION     "off my back"   TRANSURETHRAL RESECTION OF PROSTATE N/A 11/11/2017   Procedure: TRANSURETHRAL RESECTION OF THE PROSTATE (TURP);  Surgeon: Hollice Espy, MD;  Location: [facility] ORS;  Service: Urology;  Laterality: N/A;    Current Outpatient Medications  Medication Sig Dispense Refill   allopurinol (ZYLOPRIM) 300 MG tablet Take 300 mg by mouth daily.     chlorthalidone (HYGROTON) 25 MG tablet TAKE 1 TABLET BY MOUTH EVERY DAY IN THE MORNING  11   Cholecalciferol 50 MCG (2000 UT) TABS Take 2,000 Units by mouth daily.     COVID-19 mRNA vaccine, Pfizer, 30 MCG/0.3ML injection USE AS DIRECTED .3 mL 0  fluticasone (FLONASE) 50 MCG/ACT nasal spray Place 1 spray into both nostrils in the morning and at bedtime.     glipiZIDE (GLUCOTROL XL) 5 MG 24 hr tablet Take 5 mg by mouth daily.     losartan (COZAAR) 25 MG tablet Take 25 mg by mouth daily.     lovastatin (MEVACOR) 40 MG tablet Take 40 mg by mouth at bedtime.     Multiple Vitamins-Minerals (CENTRUM SILVER 50+MEN) TABS      Propylene Glycol (SYSTANE COMPLETE) 0.6 % SOLN Apply 1 drop to eye daily.     No current facility-administered medications for this visit.    No Known Allergies    Review of Systems negative except from HPI and PMH  Physical Exam BP  120/80 (BP Location: Left Arm, Patient Position: Sitting, Cuff Size: Large)   Pulse 61   Ht 5\' 8"  (1.727 m)   Wt 241 lb (109.3 kg)   SpO2 97%   BMI 36.64 kg/m  Well developed and Morbidly obese in no acute distress HENT normal Neck supple with JVP-flat Clear Device pocket well healed; without hematoma or erythema.  There is no tethering  Regular rate and rhythm, no  gallop No  murmur Abd-soft with active BS No Clubbing cyanosis  edema Skin-warm and dry A & Oriented  Grossly normal sensory and motor function  ECG a pacing @ 61 22/13/43 RBBB/LAD  PRWP Septal q waves  Assessment and  Plan  Syncope-recurrent  Trifascicular block  Pacemaker-Medtronic   Hypertension  Diabetes  Obesity   No interval syncope  BP well controlled  continue losartan 25 and chlorthalidone 25  Encouraged weight loss and exercise   HgbA1c is somewhat improved

## 2021-09-19 NOTE — Progress Notes (Signed)
   Covid-19 Vaccination Clinic  Name:  Anthony Palmer    MRN: [id number] DOB: Jun 25, 1951  09/19/2021  [name] was observed post Covid-19 immunization for 15 minutes without incident. He was provided with Vaccine Information Sheet and instruction to access the V-Safe system.   [name] was instructed to call 911 with any severe reactions post vaccine: Difficulty breathing  Swelling of face and throat  A fast heartbeat  A bad rash all over body  Dizziness and weakness   Lu Duffel, PharmD, MBA Clinical Acute Care Pharmacist

## 2021-09-19 NOTE — Patient Instructions (Signed)
Medication Instructions:  Your physician recommends that you continue on your current medications as directed. Please refer to the Current Medication list given to you today.  *If you need a refill on your cardiac medications before your next appointment, please call your pharmacy*   Lab Work: None ordered today  If you have labs (blood work) drawn today and your tests are completely normal, you will receive your results only by: [place] (if you have MyChart) OR A paper copy in the mail If you have any lab test that is abnormal or we need to change your treatment, we will call you to review the results.   Testing/Procedures: None Ordered today   Follow-Up: At [facility], you and your health needs are our priority.  As part of our continuing mission to provide you with exceptional heart care, we have created designated Provider Care Teams.  These Care Teams include your primary Cardiologist (physician) and Advanced Practice Providers (APPs -  Physician Assistants and Nurse Practitioners) who all work together to provide you with the care you need, when you need it.  We recommend signing up for the patient portal called "MyChart".  Sign up information is provided on this After Visit Summary.  MyChart is used to connect with patients for Virtual Visits (Telemedicine).  Patients are able to view lab/test results, encounter notes, upcoming appointments, etc.  Non-urgent messages can be sent to your provider as well.   To learn more about what you can do with MyChart, go to [email or web address].    Your next appointment:   1 year(s)  The format for your next appointment:   In Person  Provider:   [name], MD   Other Instructions

## 2021-10-18 ENCOUNTER — Ambulatory Visit (INDEPENDENT_AMBULATORY_CARE_PROVIDER_SITE_OTHER): Payer: Medicare Other

## 2021-10-18 DIAGNOSIS — I453 Trifascicular block: Secondary | ICD-10-CM | POA: Diagnosis not present

## 2021-10-18 LAB — CUP PACEART REMOTE DEVICE CHECK
Battery Remaining Longevity: 128 mo
Battery Voltage: 3.01 V
Brady Statistic AP VP Percent: 0.1 %
Brady Statistic AP VS Percent: 31.5 %
Brady Statistic AS VP Percent: 0.03 %
Brady Statistic AS VS Percent: 68.37 %
Brady Statistic RA Percent Paced: 31.68 %
Brady Statistic RV Percent Paced: 0.13 %
Date Time Interrogation Session: 20221026003251
Implantable Lead Implant Date: 20181024
Implantable Lead Implant Date: 20181024
Implantable Lead Location: 753859
Implantable Lead Location: 753860
Implantable Lead Model: 3830
Implantable Lead Model: 5076
Implantable Pulse Generator Implant Date: 20181024
Lead Channel Impedance Value: 361 Ohm
Lead Channel Impedance Value: 361 Ohm
Lead Channel Impedance Value: 418 Ohm
Lead Channel Impedance Value: 437 Ohm
Lead Channel Pacing Threshold Amplitude: 0.375 V
Lead Channel Pacing Threshold Amplitude: 1 V
Lead Channel Pacing Threshold Pulse Width: 0.4 ms
Lead Channel Pacing Threshold Pulse Width: 0.4 ms
Lead Channel Sensing Intrinsic Amplitude: 4.125 mV
Lead Channel Sensing Intrinsic Amplitude: 4.125 mV
Lead Channel Sensing Intrinsic Amplitude: 4.875 mV
Lead Channel Sensing Intrinsic Amplitude: 4.875 mV
Lead Channel Setting Pacing Amplitude: 2 V
Lead Channel Setting Pacing Amplitude: 2.5 V
Lead Channel Setting Pacing Pulse Width: 1 ms
Lead Channel Setting Sensing Sensitivity: 0.9 mV

## 2021-10-23 NOTE — Progress Notes (Signed)
Remote pacemaker transmission.   

## 2021-12-21 ENCOUNTER — Encounter: Payer: Self-pay | Admitting: *Deleted

## 2021-12-22 ENCOUNTER — Ambulatory Visit
Admission: RE | Admit: 2021-12-22 | Discharge: 2021-12-22 | Disposition: A | Discharge status: Home / Self Care | Payer: Medicare Other | Source: Ambulatory Visit | Attending: Gastroenterology | Admitting: Gastroenterology

## 2021-12-22 ENCOUNTER — Encounter: Payer: Self-pay | Admitting: *Deleted

## 2021-12-22 ENCOUNTER — Ambulatory Visit: Payer: Medicare Other | Admitting: Anesthesiology

## 2021-12-22 ENCOUNTER — Encounter
Admission: RE | Disposition: A | Discharge status: Home / Self Care | Payer: Self-pay | Source: Ambulatory Visit | Attending: Gastroenterology

## 2021-12-22 DIAGNOSIS — I129 Hypertensive chronic kidney disease with stage 1 through stage 4 chronic kidney disease, or unspecified chronic kidney disease: Secondary | ICD-10-CM | POA: Diagnosis not present

## 2021-12-22 DIAGNOSIS — K640 First degree hemorrhoids: Secondary | ICD-10-CM | POA: Insufficient documentation

## 2021-12-22 DIAGNOSIS — N183 Chronic kidney disease, stage 3 unspecified: Secondary | ICD-10-CM | POA: Diagnosis not present

## 2021-12-22 DIAGNOSIS — E1140 Type 2 diabetes mellitus with diabetic neuropathy, unspecified: Secondary | ICD-10-CM | POA: Diagnosis not present

## 2021-12-22 DIAGNOSIS — K635 Polyp of colon: Secondary | ICD-10-CM | POA: Insufficient documentation

## 2021-12-22 DIAGNOSIS — Z7984 Long term (current) use of oral hypoglycemic drugs: Secondary | ICD-10-CM | POA: Insufficient documentation

## 2021-12-22 DIAGNOSIS — K5730 Diverticulosis of large intestine without perforation or abscess without bleeding: Secondary | ICD-10-CM | POA: Diagnosis not present

## 2021-12-22 DIAGNOSIS — D123 Benign neoplasm of transverse colon: Secondary | ICD-10-CM | POA: Insufficient documentation

## 2021-12-22 DIAGNOSIS — Z950 Presence of cardiac pacemaker: Secondary | ICD-10-CM | POA: Diagnosis not present

## 2021-12-22 DIAGNOSIS — D122 Benign neoplasm of ascending colon: Secondary | ICD-10-CM | POA: Diagnosis not present

## 2021-12-22 DIAGNOSIS — K6389 Other specified diseases of intestine: Secondary | ICD-10-CM | POA: Diagnosis not present

## 2021-12-22 DIAGNOSIS — E1122 Type 2 diabetes mellitus with diabetic chronic kidney disease: Secondary | ICD-10-CM | POA: Insufficient documentation

## 2021-12-22 DIAGNOSIS — Z79899 Other long term (current) drug therapy: Secondary | ICD-10-CM | POA: Diagnosis not present

## 2021-12-22 DIAGNOSIS — Q438 Other specified congenital malformations of intestine: Secondary | ICD-10-CM | POA: Insufficient documentation

## 2021-12-22 DIAGNOSIS — D1779 Benign lipomatous neoplasm of other sites: Secondary | ICD-10-CM | POA: Diagnosis not present

## 2021-12-22 DIAGNOSIS — J45909 Unspecified asthma, uncomplicated: Secondary | ICD-10-CM | POA: Diagnosis not present

## 2021-12-22 DIAGNOSIS — G4730 Sleep apnea, unspecified: Secondary | ICD-10-CM | POA: Diagnosis not present

## 2021-12-22 DIAGNOSIS — Z09 Encounter for follow-up examination after completed treatment for conditions other than malignant neoplasm: Secondary | ICD-10-CM | POA: Diagnosis not present

## 2021-12-22 HISTORY — DX: Polyp of colon: K63.5

## 2021-12-22 HISTORY — PX: COLONOSCOPY: SHX5424

## 2021-12-22 HISTORY — DX: Noninfective gastroenteritis and colitis, unspecified: K52.9

## 2021-12-22 LAB — GLUCOSE, CAPILLARY: Glucose-Capillary: 108 mg/dL — ABNORMAL HIGH (ref 70–99)

## 2021-12-22 SURGERY — COLONOSCOPY
Anesthesia: General

## 2021-12-22 MED ORDER — PROPOFOL 500 MG/50ML IV EMUL
INTRAVENOUS | Status: DC | PRN
  Administered 2021-12-22: 150 ug/kg/min via INTRAVENOUS

## 2021-12-22 MED ORDER — PROPOFOL 10 MG/ML IV BOLUS
INTRAVENOUS | Status: DC | PRN
  Administered 2021-12-22: 70 mg via INTRAVENOUS

## 2021-12-22 MED ORDER — SODIUM CHLORIDE 0.9 % IV SOLN: INTRAVENOUS | Status: DC

## 2021-12-22 NOTE — Interval H&P Note (Signed)
History and Physical Interval Note:  12/22/2021 9:57 AM  Anthony Palmer  has presented today for surgery, with the diagnosis of (Z86.010) HX OF ADENOMATOUS COLONIC POLYPS.  The various methods of treatment have been discussed with the patient and family. After consideration of risks, benefits and other options for treatment, the patient has consented to  Procedure(s): COLONOSCOPY (N/A) as a surgical intervention.  The patient's history has been reviewed, patient examined, no change in status, stable for surgery.  I have reviewed the patient's chart and labs.  Questions were answered to the patient's satisfaction.     [name]  Ok to proceed with colonoscopy

## 2021-12-22 NOTE — Anesthesia Postprocedure Evaluation (Signed)
Anesthesia Post Note  Patient: Anthony Palmer  Procedure(s) Performed: COLONOSCOPY  Patient location during evaluation: Endoscopy Anesthesia Type: General Level of consciousness: awake and alert Pain management: pain level controlled Vital Signs Assessment: post-procedure vital signs reviewed and stable Respiratory status: spontaneous breathing, nonlabored ventilation and respiratory function stable Cardiovascular status: blood pressure returned to baseline and stable Postop Assessment: no apparent nausea or vomiting Anesthetic complications: no   No notable events documented.   Last Vitals:  Vitals:   12/22/21 1125 12/22/21 1129  BP:  129/82  Pulse:    Resp:    Temp: (!) 36 C   SpO2:      Last Pain:  Vitals:   12/22/21 1129  TempSrc:   PainSc: 0-No pain                 [place] Ouch

## 2021-12-22 NOTE — H&P (Signed)
Outpatient short stay form Pre-procedure 12/22/2021  [name], MD  Primary Physician: Maryland Pink, MD  Reason for visit:  Surveillance  History of present illness:   70 y/o gentleman with history of DM II, hypertension, and CKD here for colonoscopy for history of adenomatous polyps. Last colonoscopy was about a year ago but prep was suboptimal which is the reason why we are repeating the colonoscopy today. No blood thinners. No abdominal surgeries. No first degree relatives with GI malignancies.    Current Facility-Administered Medications:    0.9 %  sodium chloride infusion, , Intravenous, Continuous, Krina Mraz, Hilton Cork, MD, Last Rate: 20 mL/hr at 12/22/21 0946, New Bag at 12/22/21 0946  Medications Prior to Admission  Medication Sig Dispense Refill Last Dose   allopurinol (ZYLOPRIM) 300 MG tablet Take 300 mg by mouth daily.   12/21/2021   chlorthalidone (HYGROTON) 25 MG tablet TAKE 1 TABLET BY MOUTH EVERY DAY IN THE MORNING  11 12/21/2021   Cholecalciferol 50 MCG (2000 UT) TABS Take 2,000 Units by mouth daily.   Past Week   fluticasone (FLONASE) 50 MCG/ACT nasal spray Place 1 spray into both nostrils in the morning and at bedtime.   12/21/2021   glipiZIDE (GLUCOTROL XL) 5 MG 24 hr tablet Take 5 mg by mouth daily.   12/21/2021   losartan (COZAAR) 25 MG tablet Take 25 mg by mouth daily.   12/21/2021   lovastatin (MEVACOR) 40 MG tablet Take 40 mg by mouth at bedtime.   12/21/2021   Multiple Vitamins-Minerals (CENTRUM SILVER 50+MEN) TABS    Past Week   COVID-19 mRNA bivalent vaccine, Pfizer, (PFIZER COVID-19 VAC BIVALENT) injection Inject into the muscle. 0.3 mL 0    Propylene Glycol (SYSTANE COMPLETE) 0.6 % SOLN Apply 1 drop to eye daily.        No Known Allergies   Past Medical History:  Diagnosis Date   Arthritis    "hands, wrists" (10/16/2017)   Bilateral hydronephrosis    a. 08/2017 in setting of bladder outlet obstruction.   Bladder outlet obstruction    a.  08/2017 noted in setting of rising creatinine-->d/c'd w/ foley and plan for outpt urology f/u.   Childhood asthma    "outgrew it"   CKD (chronic kidney disease), stage III ([place])    Colon polyps    Enteritis    Gout    "on daily RX" (10/16/2017)   Hyperlipidemia    Hyperlipidemia    Hypertension    OSA on CPAP    Presence of permanent cardiac pacemaker 10/16/2017   Self-catheterizes urinary bladder    Skin cancer    "back" (10/16/2017)   Syncope    a. 08/2017 Echo: EF 60-65%, no rwma, Gr1 DD, nl RV fxn; b. 08/2017 carotid u/s: unremarkable.   Trifascicular block    Trigger finger, acquired    Type II diabetes mellitus ([place])     Review of systems:  Otherwise negative.    Physical Exam  Gen: Alert, oriented. Appears stated age.  HEENT: PERRLA. Lungs: No respiratory distress CV: RRR Abd: soft, benign, no masses Ext: No edema    Planned procedures: Proceed with colonoscopy. The patient understands the nature of the planned procedure, indications, risks, alternatives and potential complications including but not limited to bleeding, infection, perforation, damage to internal organs and possible oversedation/side effects from anesthesia. The patient agrees and gives consent to proceed.  Please refer to procedure notes for findings, recommendations and patient disposition/instructions.     Hilton Cork  Haig Prophet, MD [facility]

## 2021-12-22 NOTE — Transfer of Care (Signed)
Immediate Anesthesia Transfer of Care Note  Patient: Anthony Palmer  Procedure(s) Performed: COLONOSCOPY  Patient Location: PACU  Anesthesia Type:General  Level of Consciousness: sedated  Airway & Oxygen Therapy: Patient Spontanous Breathing and Patient connected to face mask oxygen  Post-op Assessment: Report given to RN and Post -op Vital signs reviewed and stable  Post vital signs: Reviewed and stable  Last Vitals:  Vitals Value Taken Time  BP 121/75 12/22/21 1100  Temp    Pulse 68 12/22/21 1100  Resp 11 12/22/21 1100  SpO2 95 % 12/22/21 1100  Vitals shown include unvalidated device data.  Last Pain:  Vitals:   12/22/21 0930  TempSrc: Temporal  PainSc: 0-No pain         Complications: No notable events documented.

## 2021-12-22 NOTE — Op Note (Signed)
[facility] Patient Name: Anthony Palmer Procedure Date: 12/22/2021 9:48 AM MRN: [id number] Account #: [id number] Date of Birth: May 05, 1951 Admit Type: Outpatient Age: 70 Room: [facility] ENDO ROOM 3 Gender: Male Note Status: Finalized Instrument Name: Jasper Riling [id number] Procedure:             Colonoscopy Indications:           Surveillance: History of adenomatous polyps,                         inadequate prep on last exam (<70yr Providers:             CAndrey FarmerMD, MD Referring MD:          JIrven Easterly HKary Kos MD (Referring MD) Medicines:             Monitored Anesthesia Care Complications:         No immediate complications. Estimated blood loss:                         Minimal. Procedure:             Pre-Anesthesia Assessment:                        - Prior to the procedure, a History and Physical was                         performed, and patient medications and allergies were                         reviewed. The patient is competent. The risks and                         benefits of the procedure and the sedation options and                         risks were discussed with the patient. All questions                         were answered and informed consent was obtained.                         Patient identification and proposed procedure were                         verified by the physician, the nurse, the                         anesthesiologist, the anesthetist and the technician                         in the endoscopy suite. Mental Status Examination:                         alert and oriented. Airway Examination: normal                         oropharyngeal airway and neck mobility. Respiratory  Examination: clear to auscultation. CV Examination:                         normal. Prophylactic Antibiotics: The patient does not                         require prophylactic antibiotics. Prior                          Anticoagulants: The patient has taken no previous                         anticoagulant or antiplatelet agents. ASA Grade                         Assessment: II - A patient with mild systemic disease.                         After reviewing the risks and benefits, the patient                         was deemed in satisfactory condition to undergo the                         procedure. The anesthesia plan was to use monitored                         anesthesia care (MAC). Immediately prior to                         administration of medications, the patient was                         re-assessed for adequacy to receive sedatives. The                         heart rate, respiratory rate, oxygen saturations,                         blood pressure, adequacy of pulmonary ventilation, and                         response to care were monitored throughout the                         procedure. The physical status of the patient was                         re-assessed after the procedure.                        After obtaining informed consent, the colonoscope was                         passed under direct vision. Throughout the procedure,                         the patient's blood pressure, pulse, and oxygen  saturations were monitored continuously. The                         Colonoscope was introduced through the anus and                         advanced to the the cecum, identified by appendiceal                         orifice and ileocecal valve. The colonoscopy was                         technically difficult and complex due to a redundant                         colon and significant looping. Successful completion                         of the procedure was aided by changing the patient to                         a supine position and applying abdominal pressure. The                         patient tolerated the procedure well. The quality of                          the bowel preparation was fair. Findings:      The perianal and digital rectal examinations were normal.      The ileocecal valve was severely lipomatous. Biopsies were taken with a       cold forceps for histology. Estimated blood loss was minimal.      A 1 mm polyp was found in the cecum. The polyp was sessile. The polyp       was removed with a jumbo cold forceps. Resection and retrieval were       complete. Estimated blood loss was minimal.      A 3 mm polyp was found in the distal ascending colon. The polyp was       sessile. The polyp was removed with a cold snare. Resection and       retrieval were complete. Estimated blood loss was minimal.      A 5 mm polyp was found in the transverse colon. The polyp was       semi-pedunculated. The polyp was removed with a cold snare. Resection       and retrieval were complete. Estimated blood loss was minimal.      Multiple small and large-mouthed diverticula were found in the sigmoid       colon, descending colon, transverse colon and ascending colon.      Internal hemorrhoids were found during retroflexion. The hemorrhoids       were Grade I (internal hemorrhoids that do not prolapse).      The exam was otherwise without abnormality on direct and retroflexion       views. Impression:            - Preparation of the colon was fair.                        -  Lipomatous ileocecal valve. Biopsied.                        - One 1 mm polyp in the cecum, removed with a jumbo                         cold forceps. Resected and retrieved.                        - One 3 mm polyp in the distal ascending colon,                         removed with a cold snare. Resected and retrieved.                        - One 5 mm polyp in the transverse colon, removed with                         a cold snare. Resected and retrieved.                        - Diverticulosis in the sigmoid colon, in the                         descending colon, in the transverse  colon and in the                         ascending colon.                        - Internal hemorrhoids.                        - The examination was otherwise normal on direct and                         retroflexion views. Recommendation:        - Discharge patient to home.                        - Resume previous diet.                        - Continue present medications.                        - Await pathology results.                        - Repeat colonoscopy in 3 years for surveillance.                        - Return to referring physician as previously                         scheduled. Procedure Code(s):     --- Professional ---                        [id number], Colonoscopy, flexible; with removal of  tumor(s), polyp(s), or other lesion(s) by snare                         technique                        45380, 59, Colonoscopy, flexible; with biopsy, single                         or multiple Diagnosis Code(s):     --- Professional ---                        Z86.010, Personal history of colonic polyps                        K64.0, First degree hemorrhoids                        K63.89, Other specified diseases of intestine                        K63.5, Polyp of colon                        K57.30, Diverticulosis of large intestine without                         perforation or abscess without bleeding CPT copyright 2019 American Medical Association. All rights reserved. The codes documented in this report are preliminary and upon coder review may  be revised to meet current compliance requirements. Andrey Farmer MD, MD 12/22/2021 11:00:40 AM Number of Addenda: 0 Note Initiated On: 12/22/2021 9:48 AM Scope Withdrawal Time: 0 hours 21 minutes 56 seconds  Total Procedure Duration: 0 hours 45 minutes 43 seconds  Estimated Blood Loss:  Estimated blood loss was minimal.      [facility]

## 2021-12-22 NOTE — Anesthesia Preprocedure Evaluation (Addendum)
Anesthesia Evaluation  Patient identified by MRN, date of birth, ID band Patient awake    Reviewed: Allergy & Precautions, H&P , NPO status , Patient's Chart, lab work & pertinent test results  History of Anesthesia Complications Negative for: history of anesthetic complications  Airway Mallampati: II  TM Distance: >3 FB     Dental  (+) Chipped,    Pulmonary asthma , sleep apnea and Continuous Positive Airway Pressure Ventilation , neg COPD,    breath sounds clear to auscultation       Cardiovascular hypertension, Pt. on medications (-) angina(-) Past MI and (-) Cardiac Stents + dysrhythmias (h/o trifascicular block) + pacemaker  Rhythm:regular Rate:Normal     Neuro/Psych  Neuromuscular disease (Neuropathy) negative psych ROS   GI/Hepatic negative GI ROS, Neg liver ROS,   Endo/Other  diabetes, Well Controlled, Type 2, Oral Hypoglycemic Agents  Renal/GU Renal disease (CKD)  negative genitourinary   Musculoskeletal   Abdominal   Peds  Hematology negative hematology ROS (+)   Anesthesia Other Findings Abdominal obesity Pacemaker  Past Medical History: No date: Arthritis     Comment:  "hands, wrists" (10/16/2017) No date: Bilateral hydronephrosis     Comment:  a. 08/2017 in setting of bladder outlet obstruction. No date: Bladder outlet obstruction     Comment:  a. 08/2017 noted in setting of rising creatinine-->d/c'd               w/ foley and plan for outpt urology f/u. No date: Childhood asthma     Comment:  "outgrew it" No date: CKD (chronic kidney disease), stage III No date: Gout     Comment:  "on daily RX" (10/16/2017) No date: Hyperlipidemia No date: Hyperlipidemia No date: Hypertension No date: OSA on CPAP 10/16/2017: Presence of permanent cardiac pacemaker No date: Self-catheterizes urinary bladder No date: Skin cancer     Comment:  "back" (10/16/2017) No date: Syncope     Comment:  a. 08/2017 Echo:  EF 60-65%, no rwma, Gr1 DD, nl RV fxn;               b. 08/2017 carotid u/s: unremarkable. No date: Trifascicular block No date: Trigger finger, acquired No date: Type II diabetes mellitus ([place])  Past Surgical History: 07/23/2016: COLONOSCOPY WITH PROPOFOL; N/A     Comment:  Procedure: COLONOSCOPY WITH PROPOFOL;  Surgeon: Lollie Sails, MD;  Location: [facility] ENDOSCOPY;  Service:               Endoscopy;  Laterality: N/A; 11/11/2017: CYSTOSCOPY W/ RETROGRADES; Bilateral     Comment:  Procedure: CYSTOSCOPY WITH RETROGRADE PYELOGRAM;                Surgeon: Hollice Espy, MD;  Location: [facility] ORS;                Service: Urology;  Laterality: Bilateral; 11/13/2017: CYSTOSCOPY WITH FULGERATION; N/A     Comment:  Procedure: CYSTOSCOPY WITH FULGERATION;  Surgeon:               Hollice Espy, MD;  Location: [facility] ORS;  Service:               Urology;  Laterality: N/A; 2013: INCISION AND DRAINAGE; Left     Comment:  Hematoma left leg 10/16/2017: INSERT / REPLACE / REMOVE PACEMAKER 10/16/2017: PACEMAKER IMPLANT; N/A     Comment:  Procedure: PACEMAKER IMPLANT;  Surgeon: Deboraha Sprang,  MD;  Location: [place] CV LAB;  Service:               Cardiovascular;  Laterality: N/A; No date: SKIN CANCER EXCISION     Comment:  "off my back" 11/11/2017: TRANSURETHRAL RESECTION OF PROSTATE; N/A     Comment:  Procedure: TRANSURETHRAL RESECTION OF THE PROSTATE               (TURP);  Surgeon: Hollice Espy, MD;  Location: [facility]               ORS;  Service: Urology;  Laterality: N/A;  BMI    Body Mass Index: 36.19 kg/m      Reproductive/Obstetrics negative OB ROS                            Anesthesia Physical  Anesthesia Plan  ASA: III  Anesthesia Plan: General   Post-op Pain Management:    Induction:   PONV Risk Score and Plan: 2 and Propofol infusion and TIVA  Airway Management Planned: Natural Airway and Nasal  Cannula  Additional Equipment:   Intra-op Plan:   Post-operative Plan:   Informed Consent: I have reviewed the patients History and Physical, chart, labs and discussed the procedure including the risks, benefits and alternatives for the proposed anesthesia with the patient or authorized representative who has indicated his/her understanding and acceptance.     Dental advisory given  Plan Discussed with: CRNA and Anesthesiologist  Anesthesia Plan Comments:        Anesthesia Quick Evaluation

## 2021-12-26 ENCOUNTER — Encounter: Payer: Self-pay | Admitting: Gastroenterology

## 2021-12-26 LAB — SURGICAL PATHOLOGY

## 2022-01-17 ENCOUNTER — Ambulatory Visit (INDEPENDENT_AMBULATORY_CARE_PROVIDER_SITE_OTHER): Payer: Medicare Other

## 2022-01-17 DIAGNOSIS — I452 Bifascicular block: Secondary | ICD-10-CM

## 2022-01-17 LAB — CUP PACEART REMOTE DEVICE CHECK
Battery Remaining Longevity: 126 mo
Battery Voltage: 3.01 V
Brady Statistic AP VP Percent: 0.06 %
Brady Statistic AP VS Percent: 34.95 %
Brady Statistic AS VP Percent: 0.04 %
Brady Statistic AS VS Percent: 64.94 %
Brady Statistic RA Percent Paced: 35.11 %
Brady Statistic RV Percent Paced: 0.11 %
Date Time Interrogation Session: 20230124233110
Implantable Lead Implant Date: 20181024
Implantable Lead Implant Date: 20181024
Implantable Lead Location: 753859
Implantable Lead Location: 753860
Implantable Lead Model: 3830
Implantable Lead Model: 5076
Implantable Pulse Generator Implant Date: 20181024
Lead Channel Impedance Value: 361 Ohm
Lead Channel Impedance Value: 380 Ohm
Lead Channel Impedance Value: 437 Ohm
Lead Channel Impedance Value: 437 Ohm
Lead Channel Pacing Threshold Amplitude: 0.5 V
Lead Channel Pacing Threshold Amplitude: 0.875 V
Lead Channel Pacing Threshold Pulse Width: 0.4 ms
Lead Channel Pacing Threshold Pulse Width: 0.4 ms
Lead Channel Sensing Intrinsic Amplitude: 3.625 mV
Lead Channel Sensing Intrinsic Amplitude: 3.625 mV
Lead Channel Sensing Intrinsic Amplitude: 5 mV
Lead Channel Sensing Intrinsic Amplitude: 5 mV
Lead Channel Setting Pacing Amplitude: 2 V
Lead Channel Setting Pacing Amplitude: 2.5 V
Lead Channel Setting Pacing Pulse Width: 1 ms
Lead Channel Setting Sensing Sensitivity: 0.9 mV

## 2022-01-26 NOTE — Progress Notes (Signed)
Remote pacemaker transmission.   

## 2022-02-14 ENCOUNTER — Ambulatory Visit
Admission: RE | Admit: 2022-02-14 | Discharge: 2022-02-14 | Disposition: A | Discharge status: Home / Self Care | Payer: Medicare Other | Source: Ambulatory Visit | Attending: Urgent Care | Admitting: Urgent Care

## 2022-02-14 ENCOUNTER — Other Ambulatory Visit: Payer: Self-pay

## 2022-02-14 VITALS — BP 128/80 | HR 61 | Temp 98.2°F | Resp 18

## 2022-02-14 DIAGNOSIS — J3489 Other specified disorders of nose and nasal sinuses: Secondary | ICD-10-CM | POA: Diagnosis not present

## 2022-02-14 DIAGNOSIS — E119 Type 2 diabetes mellitus without complications: Secondary | ICD-10-CM

## 2022-02-14 DIAGNOSIS — J018 Other acute sinusitis: Secondary | ICD-10-CM

## 2022-02-14 DIAGNOSIS — R052 Subacute cough: Secondary | ICD-10-CM

## 2022-02-14 DIAGNOSIS — N183 Chronic kidney disease, stage 3 unspecified: Secondary | ICD-10-CM

## 2022-02-14 MED ORDER — PSEUDOEPHEDRINE HCL 30 MG PO TABS: 30.0000 mg | ORAL_TABLET | Freq: Three times a day (TID) | ORAL | 0 refills | Status: DC | PRN

## 2022-02-14 MED ORDER — AMOXICILLIN 875 MG PO TABS: 875.0000 mg | ORAL_TABLET | Freq: Two times a day (BID) | ORAL | 0 refills | Status: DC

## 2022-02-14 MED ORDER — PROMETHAZINE-DM 6.25-15 MG/5ML PO SYRP: 5.0000 mL | ORAL_SOLUTION | Freq: Every evening | ORAL | 0 refills | Status: DC | PRN

## 2022-02-14 MED ORDER — CETIRIZINE HCL 10 MG PO TABS
10.0000 mg | ORAL_TABLET | Freq: Every day | ORAL | 0 refills | Status: DC
Start: 2022-02-14 — End: 2022-07-26

## 2022-02-14 MED ORDER — BENZONATATE 100 MG PO CAPS: 100.0000 mg | ORAL_CAPSULE | Freq: Three times a day (TID) | ORAL | 0 refills | Status: DC | PRN

## 2022-02-14 NOTE — ED Provider Notes (Signed)
Anthony Palmer   MRN: [id number] DOB: 07-26-1951  Subjective:   Anthony Palmer is a 71 y.o. male with pmh of DM, OSA, CKD III presenting for 1+ week history of acute onset persistent runny and stuffy nose, postnasal drainage, fatigue, malaise, coughing.  Has had mild occasional wheezing.  Has a history of childhood asthma but has not needed an inhaler since childhood.  No chest pain, shortness of breath, ear pain, body aches.  Patient did a COVID test at home and was negative, does not want a repeat.  No smoking.  Patient has type 2 diabetes treated without insulin.  Last A1c was done 10/09/2022 and was 6.9%.  Last creatinine was 1.6, GFR 43 on the same date.  No history of arrhythmias or heart disease.  He does have a history of hypertension and takes his medication for this daily.  No current facility-administered medications for this encounter.  Current Outpatient Medications:    allopurinol (ZYLOPRIM) 300 MG tablet, Take 300 mg by mouth daily., Disp: , Rfl:    chlorthalidone (HYGROTON) 25 MG tablet, TAKE 1 TABLET BY MOUTH EVERY DAY IN THE MORNING, Disp: , Rfl: 11   Cholecalciferol 50 MCG (2000 UT) TABS, Take 2,000 Units by mouth daily., Disp: , Rfl:    fluticasone (FLONASE) 50 MCG/ACT nasal spray, Place 1 spray into both nostrils in the morning and at bedtime., Disp: , Rfl:    glipiZIDE (GLUCOTROL XL) 5 MG 24 hr tablet, Take 5 mg by mouth daily., Disp: , Rfl:    losartan (COZAAR) 25 MG tablet, Take 25 mg by mouth daily., Disp: , Rfl:    lovastatin (MEVACOR) 40 MG tablet, Take 40 mg by mouth at bedtime., Disp: , Rfl:    Multiple Vitamins-Minerals (CENTRUM SILVER 50+MEN) TABS, , Disp: , Rfl:    Propylene Glycol (SYSTANE COMPLETE) 0.6 % SOLN, Apply 1 drop to eye daily., Disp: , Rfl:    COVID-19 mRNA bivalent vaccine, Pfizer, (PFIZER COVID-19 VAC BIVALENT) injection, Inject into the muscle., Disp: 0.3 mL, Rfl: 0   No Known Allergies  Past Medical History:  Diagnosis Date    Arthritis    "hands, wrists" (10/16/2017)   Bilateral hydronephrosis    a. 08/2017 in setting of bladder outlet obstruction.   Bladder outlet obstruction    a. 08/2017 noted in setting of rising creatinine-->d/c'd w/ foley and plan for outpt urology f/u.   Childhood asthma    "outgrew it"   CKD (chronic kidney disease), stage III ([place])    Colon polyps    Enteritis    Gout    "on daily RX" (10/16/2017)   Hyperlipidemia    Hyperlipidemia    Hypertension    OSA on CPAP    Presence of permanent cardiac pacemaker 10/16/2017   Self-catheterizes urinary bladder    Skin cancer    "back" (10/16/2017)   Syncope    a. 08/2017 Echo: EF 60-65%, no rwma, Gr1 DD, nl RV fxn; b. 08/2017 carotid u/s: unremarkable.   Trifascicular block    Trigger finger, acquired    Type II diabetes mellitus ([place])      Past Surgical History:  Procedure Laterality Date   COLONOSCOPY N/A 12/22/2021   Procedure: COLONOSCOPY;  Surgeon: [name], MD;  Location: [facility] ENDOSCOPY;  Service: Endoscopy;  Laterality: N/A;   COLONOSCOPY WITH PROPOFOL N/A 07/23/2016   Procedure: COLONOSCOPY WITH PROPOFOL;  Surgeon: Lollie Sails, MD;  Location: [facility] ENDOSCOPY;  Service: Endoscopy;  Laterality: N/A;   COLONOSCOPY  WITH PROPOFOL N/A 09/09/2020   Procedure: COLONOSCOPY WITH PROPOFOL;  Surgeon: [name], MD;  Location: [facility] ENDOSCOPY;  Service: Gastroenterology;  Laterality: N/A;   CYSTOSCOPY W/ RETROGRADES Bilateral 11/11/2017   Procedure: CYSTOSCOPY WITH RETROGRADE PYELOGRAM;  Surgeon: Hollice Espy, MD;  Location: [facility] ORS;  Service: Urology;  Laterality: Bilateral;   CYSTOSCOPY WITH FULGERATION N/A 11/13/2017   Procedure: CYSTOSCOPY WITH FULGERATION;  Surgeon: Hollice Espy, MD;  Location: [facility] ORS;  Service: Urology;  Laterality: N/A;   INCISION AND DRAINAGE Left 2013   Hematoma left leg   INSERT / REPLACE / REMOVE PACEMAKER  10/16/2017   PACEMAKER IMPLANT N/A 10/16/2017   Procedure: PACEMAKER  IMPLANT;  Surgeon: Deboraha Sprang, MD;  Location: [place] CV LAB;  Service: Cardiovascular;  Laterality: N/A;   SKIN CANCER EXCISION     "off my back"   TRANSURETHRAL RESECTION OF PROSTATE N/A 11/11/2017   Procedure: TRANSURETHRAL RESECTION OF THE PROSTATE (TURP);  Surgeon: Hollice Espy, MD;  Location: [facility] ORS;  Service: Urology;  Laterality: N/A;    Family History  Problem Relation Age of Onset   Heart disease Mother    Heart attack Mother    Heart disease Father    Heart attack Father    Stroke Father     Social History   Tobacco Use   Smoking status: Never   Smokeless tobacco: Never  Vaping Use   Vaping Use: Never used  Substance Use Topics   Alcohol use: Never   Drug use: Never    ROS   Objective:   Vitals: BP 128/80 (BP Location: Left Arm)    Pulse 61    Temp 98.2 F (36.8 C) (Oral)    Resp 18    SpO2 96%   Physical Exam Constitutional:      General: He is not in acute distress.    Appearance: Normal appearance. He is well-developed. He is not ill-appearing, toxic-appearing or diaphoretic.  HENT:     Head: Normocephalic and atraumatic.     Right Ear: External ear normal.     Left Ear: External ear normal.     Nose: Congestion present. No rhinorrhea.     Mouth/Throat:     Mouth: Mucous membranes are moist.     Pharynx: No oropharyngeal exudate or posterior oropharyngeal erythema.  Eyes:     General: No scleral icterus.       Right eye: No discharge.        Left eye: No discharge.     Extraocular Movements: Extraocular movements intact.  Cardiovascular:     Rate and Rhythm: Normal rate and regular rhythm.     Heart sounds: Normal heart sounds. No murmur heard.   No friction rub. No gallop.  Pulmonary:     Effort: Pulmonary effort is normal. No respiratory distress.     Breath sounds: Normal breath sounds. No stridor. No wheezing, rhonchi or rales.  Neurological:     Mental Status: He is alert and oriented to person, place, and time.   Psychiatric:        Mood and Affect: Mood normal.        Behavior: Behavior normal.        Thought Content: Thought content normal.    Assessment and Plan :   PDMP not reviewed this encounter.  1. Acute non-recurrent sinusitis of other sinus   2. Stuffy and runny nose   3. Subacute cough   4. Type 2 diabetes mellitus treated without insulin ([place])  5. Stage 3 chronic kidney disease, unspecified whether stage 3a or 3b CKD ([place])     Will start empiric treatment for sinusitis with amoxicillin.  Recommended supportive care otherwise including the use of oral antihistamine, decongestant, cough suppression medications. Deferred imaging given clear cardiopulmonary exam, hemodynamically stable vital signs.  Patient declined COVID testing and I am in agreement given the timeline of his illness.  Counseled patient on potential for adverse effects with medications prescribed/recommended today, ER and return-to-clinic precautions discussed, patient verbalized understanding.    Jaynee Eagles, PA-C 02/14/22 1006

## 2022-02-14 NOTE — ED Triage Notes (Signed)
Pt c/o cough, chest congestion, runny nose for over a week. At home covid test was negative.

## 2022-04-18 ENCOUNTER — Ambulatory Visit (INDEPENDENT_AMBULATORY_CARE_PROVIDER_SITE_OTHER): Payer: Medicare Other

## 2022-04-18 DIAGNOSIS — I452 Bifascicular block: Secondary | ICD-10-CM

## 2022-04-19 LAB — CUP PACEART REMOTE DEVICE CHECK
Battery Remaining Longevity: 122 mo
Battery Voltage: 3.01 V
Brady Statistic AP VP Percent: 0.1 %
Brady Statistic AP VS Percent: 36.59 %
Brady Statistic AS VP Percent: 0.04 %
Brady Statistic AS VS Percent: 63.27 %
Brady Statistic RA Percent Paced: 36.8 %
Brady Statistic RV Percent Paced: 0.15 %
Date Time Interrogation Session: 20230426003304
Implantable Lead Implant Date: 20181024
Implantable Lead Implant Date: 20181024
Implantable Lead Location: 753859
Implantable Lead Location: 753860
Implantable Lead Model: 3830
Implantable Lead Model: 5076
Implantable Pulse Generator Implant Date: 20181024
Lead Channel Impedance Value: 380 Ohm
Lead Channel Impedance Value: 380 Ohm
Lead Channel Impedance Value: 418 Ohm
Lead Channel Impedance Value: 456 Ohm
Lead Channel Pacing Threshold Amplitude: 0.5 V
Lead Channel Pacing Threshold Amplitude: 1 V
Lead Channel Pacing Threshold Pulse Width: 0.4 ms
Lead Channel Pacing Threshold Pulse Width: 0.4 ms
Lead Channel Sensing Intrinsic Amplitude: 3.75 mV
Lead Channel Sensing Intrinsic Amplitude: 3.75 mV
Lead Channel Sensing Intrinsic Amplitude: 4.875 mV
Lead Channel Sensing Intrinsic Amplitude: 4.875 mV
Lead Channel Setting Pacing Amplitude: 2 V
Lead Channel Setting Pacing Amplitude: 2.5 V
Lead Channel Setting Pacing Pulse Width: 1 ms
Lead Channel Setting Sensing Sensitivity: 0.9 mV

## 2022-04-25 ENCOUNTER — Encounter: Payer: Self-pay | Admitting: Dietician

## 2022-04-25 ENCOUNTER — Encounter: Payer: Medicare Other | Attending: Family Medicine | Admitting: Dietician

## 2022-04-25 VITALS — Ht 68.0 in | Wt 225.6 lb

## 2022-04-25 DIAGNOSIS — E1122 Type 2 diabetes mellitus with diabetic chronic kidney disease: Secondary | ICD-10-CM | POA: Diagnosis not present

## 2022-04-25 DIAGNOSIS — N184 Chronic kidney disease, stage 4 (severe): Secondary | ICD-10-CM | POA: Insufficient documentation

## 2022-04-25 DIAGNOSIS — N1832 Chronic kidney disease, stage 3b: Secondary | ICD-10-CM

## 2022-04-25 NOTE — Patient Instructions (Signed)
Limit portions/ frequency of high potassium foods, especially potatoes, tomatoes. ?When making soup, limit potatoes, tomatoes. Choose low sodium broth. ?Keep portions of meats/ high protein foods to 3oz or less with each meal (21g protein) ?

## 2022-04-25 NOTE — Progress Notes (Signed)
Medical Nutrition Therapy: Visit start time: 8937  end time: 3428  ?Assessment:  Diagnosis: Type 2 Diabetes, CKD stage 4 ?Past medical history: HTN, hyperlipidemia, gout, sleep apnea ?Psychosocial issues/ stress concerns: none ? ?Preferred learning method:  ?Auditory ?Visual ? ?Current weight: 225.6lbs Height: 5'8" BMI: 34.3 ? ?Medications, supplements: reconciled list in medical record ? ?Progress and evaluation:  ?Patient reports following low potassium, low sodium diet since CKD has progressed to stage 4.  ?He voices confusion over conflicting online information regarding potassium/ phosphorus levels in foods and recommended intake/ restriction. ?Recent labwork (02/15/22) indicates sodium normal at 138, potassium normal 4.6, protein normal 6.5, albumin normal 4.1, creatinine elevated at 1.7, BUN elevated at 29, GFR low at 40; HbA1C of 6.7%.  ? ?Physical activity: treadmill, spinner, other cardio machines 90 minutes, 3-4 times a week ? ?Dietary Intake:  ?Usual eating pattern includes 3 meals and 0-1 snacks per day. ?Dining out frequency: 8-10 meals per week. ? ?Breakfast: low potassium cereal bar + apple or grapes; boiled/ scrambled egg on bagel ?Snack: none ?Lunch: grilled chicken/ chicken salad on white bread/ salad with grilled chicken or chix sal ?Snack: none ?Supper: similar to lunch; grilled chicken + green beans/ coleslaw/  tyring to choose low potassium, limiting pinto beans ?Snack: none ?Beverages: water ? ?Intervention:  ? ?Nutrition Care Education: ?  ?Basic nutrition: basic food groups; appropriate nutrient balance; appropriate meal and snack schedule; general nutrition guidelines    ?Weight control: identifying healthy weight, determining reasonable weight loss rate; importance of low sugar and low fat choices, estimated energy needs for weight loss at 1800-2000 kcal, provided guidance for 50% CHO, 20% pro, 30% fat ?Advanced nutrition:  food label reading for carbohydrate ?Diabetes:  appropriate meal  and snack schedule; appropriate carb intake and balance, low potassium, low sodium carb choices, role of fiber, protein, fat; discussed consumption of moderate amounts of healthy fats to ensure adequate caloric intake while controlling protein and carb intake. ?CKD: protein controlled diet with 1g/kg of goal weight; lean choices; appropriate portion sizes for protein foods; keeping sodium to '500mg'$  or less per meal/ '2000mg'$  or less daily; high potassium foods and choosing mostly moderate- low potassium foods; limiting phosphorus if future labwork indicates high phosphorus levels.  ?Hypertension:  importance of controlling BP, identifying high sodium foods ? ?Additional notes: ?Patient will be able to liberalize diet somewhat with moderate potassium and phosphorus intake. Advised further restriction if future labwork indicates elevated potassium, phosphorus, or sodium ?No MNT follow up needed at this time; patient will schedule later as needed. ? ?Nutritional Diagnosis:  [place]-2.2 Altered nutrition-related laboratory As related to Stage 4 renal disease, Type 2 diabetes.  As evidenced by patient with low GFR and elevated BUN and creatinine; elevated HbA1C within goal for diabetes control. ?[place]-3.3 Overweight/obesity As related to history of excess calories.  As evidenced by patient with current BMI of 34.3, making dietary changes to promote weight loss. ? ? ?Education Materials given:  ?Kidney Disease Nutrition Therapy (NCM) ?Food Pyramid for Renal Disease ([facility]) ?Plate Planner with food lists, sample meal pattern ?Visit summary with goals/ instructions ? ? ?Learner/ who was taught:  ?Patient  ?Spouse/ partner ? ? ?Level of understanding: ?Verbalizes/ demonstrates competency ? ? ?Demonstrated degree of understanding via:   Teach back ?Learning barriers: ?None ? ?Willingness to learn/ readiness for change: ?Eager, change in progress ? ? ?Monitoring and Evaluation:  Dietary intake, exercise, BG control, renal function/  labs, and body weight ?     follow  up: prn ? ?

## 2022-05-03 NOTE — Progress Notes (Signed)
Remote pacemaker transmission.   

## 2022-07-18 ENCOUNTER — Ambulatory Visit (INDEPENDENT_AMBULATORY_CARE_PROVIDER_SITE_OTHER): Payer: Medicare Other

## 2022-07-18 DIAGNOSIS — I452 Bifascicular block: Secondary | ICD-10-CM | POA: Diagnosis not present

## 2022-07-18 LAB — CUP PACEART REMOTE DEVICE CHECK
Battery Remaining Longevity: 119 mo
Battery Voltage: 3.01 V
Brady Statistic AP VP Percent: 0.32 %
Brady Statistic AP VS Percent: 48.43 %
Brady Statistic AS VP Percent: 0.03 %
Brady Statistic AS VS Percent: 51.22 %
Brady Statistic RA Percent Paced: 48.85 %
Brady Statistic RV Percent Paced: 0.35 %
Date Time Interrogation Session: 20230726001904
Implantable Lead Implant Date: 20181024
Implantable Lead Implant Date: 20181024
Implantable Lead Location: 753859
Implantable Lead Location: 753860
Implantable Lead Model: 3830
Implantable Lead Model: 5076
Implantable Pulse Generator Implant Date: 20181024
Lead Channel Impedance Value: 361 Ohm
Lead Channel Impedance Value: 361 Ohm
Lead Channel Impedance Value: 399 Ohm
Lead Channel Impedance Value: 437 Ohm
Lead Channel Pacing Threshold Amplitude: 0.5 V
Lead Channel Pacing Threshold Amplitude: 1 V
Lead Channel Pacing Threshold Pulse Width: 0.4 ms
Lead Channel Pacing Threshold Pulse Width: 0.4 ms
Lead Channel Sensing Intrinsic Amplitude: 3 mV
Lead Channel Sensing Intrinsic Amplitude: 3 mV
Lead Channel Sensing Intrinsic Amplitude: 3.875 mV
Lead Channel Sensing Intrinsic Amplitude: 3.875 mV
Lead Channel Setting Pacing Amplitude: 2 V
Lead Channel Setting Pacing Amplitude: 2.5 V
Lead Channel Setting Pacing Pulse Width: 1 ms
Lead Channel Setting Sensing Sensitivity: 0.9 mV

## 2022-07-26 ENCOUNTER — Ambulatory Visit (INDEPENDENT_AMBULATORY_CARE_PROVIDER_SITE_OTHER): Payer: Medicare Other | Admitting: Dermatology

## 2022-07-26 DIAGNOSIS — B354 Tinea corporis: Secondary | ICD-10-CM

## 2022-07-26 DIAGNOSIS — Z7189 Other specified counseling: Secondary | ICD-10-CM | POA: Diagnosis not present

## 2022-07-26 DIAGNOSIS — Z85828 Personal history of other malignant neoplasm of skin: Secondary | ICD-10-CM | POA: Diagnosis not present

## 2022-07-26 DIAGNOSIS — Z872 Personal history of diseases of the skin and subcutaneous tissue: Secondary | ICD-10-CM

## 2022-07-26 DIAGNOSIS — D1800 Hemangioma unspecified site: Secondary | ICD-10-CM

## 2022-07-26 DIAGNOSIS — D229 Melanocytic nevi, unspecified: Secondary | ICD-10-CM

## 2022-07-26 DIAGNOSIS — L821 Other seborrheic keratosis: Secondary | ICD-10-CM

## 2022-07-26 DIAGNOSIS — L578 Other skin changes due to chronic exposure to nonionizing radiation: Secondary | ICD-10-CM

## 2022-07-26 DIAGNOSIS — L814 Other melanin hyperpigmentation: Secondary | ICD-10-CM

## 2022-07-26 DIAGNOSIS — Z1283 Encounter for screening for malignant neoplasm of skin: Secondary | ICD-10-CM | POA: Diagnosis not present

## 2022-07-26 DIAGNOSIS — Z86018 Personal history of other benign neoplasm: Secondary | ICD-10-CM

## 2022-07-26 MED ORDER — TERBINAFINE HCL 250 MG PO TABS: 250.0000 mg | ORAL_TABLET | ORAL | 1 refills | Status: DC

## 2022-07-26 NOTE — Patient Instructions (Addendum)
     Melanoma ABCDEs  Melanoma is the most dangerous type of skin cancer, and is the leading cause of death from skin disease.  You are more likely to develop melanoma if you: Have light-colored skin, light-colored eyes, or red or blond hair Spend a lot of time in the sun Tan regularly, either outdoors or in a tanning bed Have had blistering sunburns, especially during childhood Have a close family member who has had a melanoma Have atypical moles or large birthmarks  Early detection of melanoma is key since treatment is typically straightforward and cure rates are extremely high if we catch it early.   The first sign of melanoma is often a change in a mole or a new dark spot.  The ABCDE system is a way of remembering the signs of melanoma.  A for asymmetry:  The two halves do not match. B for border:  The edges of the growth are irregular. C for color:  A mixture of colors are present instead of an even brown color. D for diameter:  Melanomas are usually (but not always) greater than 6mm - the size of a pencil eraser. E for evolution:  The spot keeps changing in size, shape, and color.  Please check your skin once per month between visits. You can use a small mirror in front and a large mirror behind you to keep an eye on the back side or your body.   If you see any new or changing lesions before your next follow-up, please call to schedule a visit.  Please continue daily skin protection including broad spectrum sunscreen SPF 30+ to sun-exposed areas, reapplying every 2 hours as needed when you're outdoors.   Staying in the shade or wearing long sleeves, sun glasses (UVA+UVB protection) and wide brim hats (4-inch brim around the entire circumference of the hat) are also recommended for sun protection.    Due to recent changes in healthcare laws, you may see results of your pathology and/or laboratory studies on MyChart before the doctors have had a chance to review them. We  understand that in some cases there may be results that are confusing or concerning to you. Please understand that not all results are received at the same time and often the doctors may need to interpret multiple results in order to provide you with the best plan of care or course of treatment. Therefore, we ask that you please give us 2 business days to thoroughly review all your results before contacting the office for clarification. Should we see a critical lab result, you will be contacted sooner.   If You Need Anything After Your Visit  If you have any questions or concerns for your doctor, please call our main line at [phone] and press option 4 to reach your doctor's medical assistant. If no one answers, please leave a voicemail as directed and we will return your call as soon as possible. Messages left after 4 pm will be answered the following business day.   You may also send us a message via MyChart. We typically respond to MyChart messages within 1-2 business days.  For prescription refills, please ask your pharmacy to contact our office. Our fax number is [phone].  If you have an urgent issue when the clinic is closed that cannot wait until the next business day, you can page your doctor at the number below.    Please note that while we do our best to be available for urgent issues   outside of office hours, we are not available 24/7.   If you have an urgent issue and are unable to reach us, you may choose to seek medical care at your doctor's office, retail clinic, urgent care center, or emergency room.  If you have a medical emergency, please immediately call 911 or go to the emergency department.  Pager Numbers  - Dr. Kowalski: [phone]  - Dr. Moye: [phone]  - Dr. Stewart: [phone]  In the event of inclement weather, please call our main line at [phone] for an update on the status of any delays or closures.  Dermatology Medication Tips: Please  keep the boxes that topical medications come in in order to help keep track of the instructions about where and how to use these. Pharmacies typically print the medication instructions only on the boxes and not directly on the medication tubes.   If your medication is too expensive, please contact our office at [phone] option 4 or send us a message through MyChart.   We are unable to tell what your co-pay for medications will be in advance as this is different depending on your insurance coverage. However, we may be able to find a substitute medication at lower cost or fill out paperwork to get insurance to cover a needed medication.   If a prior authorization is required to get your medication covered by your insurance company, please allow us 1-2 business days to complete this process.  Drug prices often vary depending on where the prescription is filled and some pharmacies may offer cheaper prices.  The website www.goodrx.com contains coupons for medications through different pharmacies. The prices here do not account for what the cost may be with help from insurance (it may be cheaper with your insurance), but the website can give you the price if you did not use any insurance.  - You can print the associated coupon and take it with your prescription to the pharmacy.  - You may also stop by our office during regular business hours and pick up a GoodRx coupon card.  - If you need your prescription sent electronically to a different pharmacy, notify our office through [facility] MyChart or by phone at [phone] option 4.     Si Usted Necesita Algo Despus de Su Visita  Tambin puede enviarnos un mensaje a travs de MyChart. Por lo general respondemos a los mensajes de MyChart en el transcurso de 1 a 2 das hbiles.  Para renovar recetas, por favor pida a su farmacia que se ponga en contacto con nuestra oficina. Nuestro nmero de fax es el [phone].  Si tiene un asunto urgente  cuando la clnica est cerrada y que no puede esperar hasta el siguiente da hbil, puede llamar/localizar a su doctor(a) al nmero que aparece a continuacin.   Por favor, tenga en cuenta que aunque hacemos todo lo posible para estar disponibles para asuntos urgentes fuera del horario de oficina, no estamos disponibles las 24 horas del da, los 7 das de la semana.   Si tiene un problema urgente y no puede comunicarse con nosotros, puede optar por buscar atencin mdica  en el consultorio de su doctor(a), en una clnica privada, en un centro de atencin urgente o en una sala de emergencias.  Si tiene una emergencia mdica, por favor llame inmediatamente al 911 o vaya a la sala de emergencias.  Nmeros de bper  - Dr. Kowalski: [phone]  - Dra. Moye: [phone]  - Dra. Stewart: [phone]  En caso   de inclemencias del tiempo, por favor llame a nuestra lnea principal al [phone] para una actualizacin sobre el estado de cualquier retraso o cierre.  Consejos para la medicacin en dermatologa: Por favor, guarde las cajas en las que vienen los medicamentos de uso tpico para ayudarle a seguir las instrucciones sobre dnde y cmo usarlos. Las farmacias generalmente imprimen las instrucciones del medicamento slo en las cajas y no directamente en los tubos del medicamento.   Si su medicamento es muy caro, por favor, pngase en contacto con nuestra oficina llamando al [phone] y presione la opcin 4 o envenos un mensaje a travs de MyChart.   No podemos decirle cul ser su copago por los medicamentos por adelantado ya que esto es diferente dependiendo de la cobertura de su seguro. Sin embargo, es posible que podamos encontrar un medicamento sustituto a menor costo o llenar un formulario para que el seguro cubra el medicamento que se considera necesario.   Si se requiere una autorizacin previa para que su compaa de seguros cubra su medicamento, por favor permtanos de 1 a 2  das hbiles para completar este proceso.  Los precios de los medicamentos varan con frecuencia dependiendo del lugar de dnde se surte la receta y alguna farmacias pueden ofrecer precios ms baratos.  El sitio web www.goodrx.com tiene cupones para medicamentos de diferentes farmacias. Los precios aqu no tienen en cuenta lo que podra costar con la ayuda del seguro (puede ser ms barato con su seguro), pero el sitio web puede darle el precio si no utiliz ningn seguro.  - Puede imprimir el cupn correspondiente y llevarlo con su receta a la farmacia.  - Tambin puede pasar por nuestra oficina durante el horario de atencin regular y recoger una tarjeta de cupones de GoodRx.  - Si necesita que su receta se enve electrnicamente a una farmacia diferente, informe a nuestra oficina a travs de MyChart de [place] o por telfono llamando al [phone] y presione la opcin 4.  

## 2022-07-26 NOTE — Progress Notes (Signed)
New Patient Visit  Subjective  DRESEAN BECKEL is a 71 y.o. male who presents for the following: New Patient (Initial Visit) (Patient here to establish care. Tbse. Hx of precancers. ). Brought records in from previous dermatologist.  Reviewed records with patient.  The patient presents for Total-Body Skin Exam (TBSE) for skin cancer screening and mole check.  The patient has spots, moles and lesions to be evaluated, some may be new or changing and the patient has concerns that these could be cancer.  The following portions of the chart were reviewed this encounter and updated as appropriate:   Tobacco  Allergies  Meds  Problems  Med Hx  Surg Hx  Fam Hx     Review of Systems:  No other skin or systemic complaints except as noted in HPI or Assessment and Plan.  Objective  Well appearing patient in no apparent distress; mood and affect are within normal limits.  A full examination was performed including scalp, head, eyes, ears, nose, lips, neck, chest, axillae, abdomen, back, buttocks, bilateral upper extremities, bilateral lower extremities, hands, feet, fingers, toes, fingernails, and toenails. All findings within normal limits unless otherwise noted below.   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 Condition is symptomatic / bothersome to patient. Not to goal.  Start Terbinafine 250 mg tablet - take 1 tablet by mouth 3 x weekly (M-W-F).  12 pills and 1rf  Terbinafine Counseling Terbinafine is an anti-fungal medicine that can be applied to the skin (over the counter) or taken by mouth (prescription) to treat fungal infections. The pill version is often used to treat fungal infections of the nails or scalp. While most people do not have any side effects from taking terbinafine pills, some possible side effects of the medicine can include taste  changes, headache, loss of smell, vision changes, nausea, vomiting, or diarrhea.   Rare side effects can include irritation of the liver, allergic reaction, or decrease in blood counts (which may show up as not feeling well or developing an infection). If you are concerned about any of these side effects, please stop the medicine and call your doctor, or in the case of an emergency such as feeling very unwell, seek immediate medical care.   terbinafine (LAMISIL) 250 MG tablet - back, trunk, legs, feet Take 1 tablet (250 mg total) by mouth 3 (three) times a week. Take M-W-F  Lentigines - Scattered tan macules - Due to sun exposure - Benign-appearing, observe - Recommend daily broad spectrum sunscreen SPF 30+ to sun-exposed areas, reapply every 2 hours as needed. - Call for any changes  Seborrheic Keratoses - Stuck-on, waxy, tan-brown papules and/or plaques  - Benign-appearing - Discussed benign etiology and prognosis. - Observe - Call for any changes  Melanocytic Nevi - Tan-brown and/or pink-flesh-colored symmetric macules and papules - Benign appearing on exam today - Observation - Call clinic for new or changing moles - Recommend daily use of broad spectrum spf 30+ sunscreen to sun-exposed areas.   Hemangiomas - Red papules - Discussed benign nature - Observe - Call for any changes  Actinic Damage - Chronic condition, secondary to cumulative UV/sun exposure - diffuse scaly erythematous macules with underlying dyspigmentation - Recommend daily broad spectrum sunscreen SPF 30+ to sun-exposed areas, reapply every 2 hours as needed.  - Staying in the shade or wearing long sleeves, sun glasses (UVA+UVB protection) and  wide brim hats (4-inch brim around the entire circumference of the hat) are also recommended for sun protection.  - Call for new or changing lesions.  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  - Call if any new or changing lesions are noted between office visits  History of Skin Cancer  Patient reports history of some type of skin cancer Clear. Observe for recurrence.  Call clinic for new or changing lesions.   Recommend regular skin exams, daily broad-spectrum spf 30+ sunscreen use, and photoprotection.     Skin cancer screening performed today. No follow-ups on file.  IRuthell Rummage, CMA, am acting as scribe for Sarina Ser, MD. Documentation: I have reviewed the above documentation for accuracy and completeness, and I agree with the above.  Sarina Ser, MD

## 2022-07-28 ENCOUNTER — Encounter: Payer: Self-pay | Admitting: Dermatology

## 2022-08-13 NOTE — Progress Notes (Signed)
Remote pacemaker transmission.   

## 2022-08-21 ENCOUNTER — Encounter: Payer: Self-pay | Admitting: Dermatology

## 2022-09-18 ENCOUNTER — Encounter: Payer: Medicare Other | Admitting: Internal Medicine

## 2022-09-24 ENCOUNTER — Ambulatory Visit (INDEPENDENT_AMBULATORY_CARE_PROVIDER_SITE_OTHER): Payer: Medicare Other | Admitting: Dermatology

## 2022-09-24 ENCOUNTER — Encounter: Payer: Self-pay | Admitting: Dermatology

## 2022-09-24 DIAGNOSIS — Z85820 Personal history of malignant melanoma of skin: Secondary | ICD-10-CM | POA: Diagnosis not present

## 2022-09-24 DIAGNOSIS — Z79899 Other long term (current) drug therapy: Secondary | ICD-10-CM | POA: Diagnosis not present

## 2022-09-24 DIAGNOSIS — B354 Tinea corporis: Secondary | ICD-10-CM

## 2022-09-24 DIAGNOSIS — N183 Chronic kidney disease, stage 3 unspecified: Secondary | ICD-10-CM | POA: Diagnosis not present

## 2022-09-24 MED ORDER — TERBINAFINE HCL 250 MG PO TABS: 250.0000 mg | ORAL_TABLET | ORAL | 1 refills | Status: DC

## 2022-09-24 NOTE — Progress Notes (Signed)
   Follow-Up Visit   Subjective  Anthony Palmer is a 71 y.o. male who presents for the following: Follow-up (8 weeks f/u tinea corporis patient took Terbinafine 250 mg 3 days weeks for 2 months with a good response. ). Chronic kidney disease stage 3. Patient is tolerating low-dose terbinafine well.    The following portions of the chart were reviewed this encounter and updated as appropriate:   Tobacco  Allergies  Meds  Problems  Med Hx  Surg Hx  Fam Hx     Review of Systems:  No other skin or systemic complaints except as noted in HPI or Assessment and Plan.  Objective  Well appearing patient in no apparent distress; mood and affect are within normal limits.  A focused examination was performed including feet,chest,back. Relevant physical exam findings are noted in the Assessment and Plan.   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 Condition is symptomatic / bothersome to patient. Not to goal. Stage 3 chronic kidney disease    Restart Terbinafine 250 mg tablet - take 1 tablet by mouth 3 x weekly (M-W-F).  12 pills and 1rf (Although terbinafine is hepatically metabolized, will do low dose due to kidney disease) Patient is tolerating terbinafine well without side effect.  He is seeing good results with improvement of the feet and body.   Terbinafine Counseling Terbinafine is an anti-fungal medicine that can be applied to the skin (over the counter) or taken by mouth (prescription) to treat fungal infections. The pill version is often used to treat fungal infections of the nails or scalp. While most people do not have any side effects from taking terbinafine pills, some possible side effects of the medicine can include taste changes, headache, loss of smell, vision changes, nausea, vomiting, or diarrhea.    Rare side effects can include  irritation of the liver, allergic reaction, or decrease in blood counts (which may show up as not feeling well or developing an infection). If you are concerned about any of these side effects, please stop the medicine and call your doctor, or in the case of an emergency such as feeling very unwell, seek immediate medical car  Related Medications terbinafine (LAMISIL) 250 MG tablet Take 1 tablet (250 mg total) by mouth 3 (three) times a week. Take M-W-F  "History of Melanoma" Reviewed patients previous dermatology notes from [facility] in 2018 notes report history of Melanoma, location- not documented, per patient hx of Melanoma on his back removed in 2018 -- Recommend regular full body skin exams - Recommend daily broad spectrum sunscreen SPF 30+ to sun-exposed areas, reapply every 2 hours as needed.  - Call if any new or changing lesions are noted between office visits   Return in about 6 months (around 03/26/2023) for TBSE, recheck tinea .  IMarye Round, CMA, am acting as scribe for Sarina Ser, MD .  Documentation: I have reviewed the above documentation for accuracy and completeness, and I agree with the above.  Sarina Ser, MD

## 2022-09-24 NOTE — Patient Instructions (Signed)
Due to recent changes in healthcare laws, you may see results of your pathology and/or laboratory studies on MyChart before the doctors have had a chance to review them. We understand that in some cases there may be results that are confusing or concerning to you. Please understand that not all results are received at the same time and often the doctors may need to interpret multiple results in order to provide you with the best plan of care or course of treatment. Therefore, we ask that you please give us 2 business days to thoroughly review all your results before contacting the office for clarification. Should we see a critical lab result, you will be contacted sooner.   If You Need Anything After Your Visit  If you have any questions or concerns for your doctor, please call our main line at [phone] and press option 4 to reach your doctor's medical assistant. If no one answers, please leave a voicemail as directed and we will return your call as soon as possible. Messages left after 4 pm will be answered the following business day.   You may also send us a message via MyChart. We typically respond to MyChart messages within 1-2 business days.  For prescription refills, please ask your pharmacy to contact our office. Our fax number is [phone].  If you have an urgent issue when the clinic is closed that cannot wait until the next business day, you can page your doctor at the number below.    Please note that while we do our best to be available for urgent issues outside of office hours, we are not available 24/7.   If you have an urgent issue and are unable to reach us, you may choose to seek medical care at your doctor's office, retail clinic, urgent care center, or emergency room.  If you have a medical emergency, please immediately call 911 or go to the emergency department.  Pager Numbers  - Dr. Kowalski: [phone]  - Dr. Moye: [phone]  - Dr. Stewart:  [phone]  In the event of inclement weather, please call our main line at [phone] for an update on the status of any delays or closures.  Dermatology Medication Tips: Please keep the boxes that topical medications come in in order to help keep track of the instructions about where and how to use these. Pharmacies typically print the medication instructions only on the boxes and not directly on the medication tubes.   If your medication is too expensive, please contact our office at [phone] option 4 or send us a message through MyChart.   We are unable to tell what your co-pay for medications will be in advance as this is different depending on your insurance coverage. However, we may be able to find a substitute medication at lower cost or fill out paperwork to get insurance to cover a needed medication.   If a prior authorization is required to get your medication covered by your insurance company, please allow us 1-2 business days to complete this process.  Drug prices often vary depending on where the prescription is filled and some pharmacies may offer cheaper prices.  The website www.goodrx.com contains coupons for medications through different pharmacies. The prices here do not account for what the cost may be with help from insurance (it may be cheaper with your insurance), but the website can give you the price if you did not use any insurance.  - You can print the associated coupon and take it with   your prescription to the pharmacy.  - You may also stop by our office during regular business hours and pick up a GoodRx coupon card.  - If you need your prescription sent electronically to a different pharmacy, notify our office through [facility] MyChart or by phone at [phone] option 4.     Si Usted Necesita Algo Despus de Su Visita  Tambin puede enviarnos un mensaje a travs de MyChart. Por lo general respondemos a los mensajes de MyChart en el transcurso de 1 a 2  das hbiles.  Para renovar recetas, por favor pida a su farmacia que se ponga en contacto con nuestra oficina. Nuestro nmero de fax es el [phone].  Si tiene un asunto urgente cuando la clnica est cerrada y que no puede esperar hasta el siguiente da hbil, puede llamar/localizar a su doctor(a) al nmero que aparece a continuacin.   Por favor, tenga en cuenta que aunque hacemos todo lo posible para estar disponibles para asuntos urgentes fuera del horario de oficina, no estamos disponibles las 24 horas del da, los 7 das de la semana.   Si tiene un problema urgente y no puede comunicarse con nosotros, puede optar por buscar atencin mdica  en el consultorio de su doctor(a), en una clnica privada, en un centro de atencin urgente o en una sala de emergencias.  Si tiene una emergencia mdica, por favor llame inmediatamente al 911 o vaya a la sala de emergencias.  Nmeros de bper  - Dr. Kowalski: [phone]  - Dra. Moye: [phone]  - Dra. Stewart: [phone]  En caso de inclemencias del tiempo, por favor llame a nuestra lnea principal al [phone] para una actualizacin sobre el estado de cualquier retraso o cierre.  Consejos para la medicacin en dermatologa: Por favor, guarde las cajas en las que vienen los medicamentos de uso tpico para ayudarle a seguir las instrucciones sobre dnde y cmo usarlos. Las farmacias generalmente imprimen las instrucciones del medicamento slo en las cajas y no directamente en los tubos del medicamento.   Si su medicamento es muy caro, por favor, pngase en contacto con nuestra oficina llamando al [phone] y presione la opcin 4 o envenos un mensaje a travs de MyChart.   No podemos decirle cul ser su copago por los medicamentos por adelantado ya que esto es diferente dependiendo de la cobertura de su seguro. Sin embargo, es posible que podamos encontrar un medicamento sustituto a menor costo o llenar un formulario para que el  seguro cubra el medicamento que se considera necesario.   Si se requiere una autorizacin previa para que su compaa de seguros cubra su medicamento, por favor permtanos de 1 a 2 das hbiles para completar este proceso.  Los precios de los medicamentos varan con frecuencia dependiendo del lugar de dnde se surte la receta y alguna farmacias pueden ofrecer precios ms baratos.  El sitio web www.goodrx.com tiene cupones para medicamentos de diferentes farmacias. Los precios aqu no tienen en cuenta lo que podra costar con la ayuda del seguro (puede ser ms barato con su seguro), pero el sitio web puede darle el precio si no utiliz ningn seguro.  - Puede imprimir el cupn correspondiente y llevarlo con su receta a la farmacia.  - Tambin puede pasar por nuestra oficina durante el horario de atencin regular y recoger una tarjeta de cupones de GoodRx.  - Si necesita que su receta se enve electrnicamente a una farmacia diferente, informe a nuestra oficina a travs de MyChart de    o por telfono llamando al [phone] y presione la opcin 4.  

## 2022-10-02 ENCOUNTER — Encounter: Payer: Self-pay | Admitting: Internal Medicine

## 2022-10-02 ENCOUNTER — Ambulatory Visit: Payer: Medicare Other | Attending: Internal Medicine | Admitting: Internal Medicine

## 2022-10-02 VITALS — BP 126/74 | HR 59 | Ht 67.0 in | Wt 215.0 lb

## 2022-10-02 DIAGNOSIS — R001 Bradycardia, unspecified: Secondary | ICD-10-CM | POA: Diagnosis present

## 2022-10-02 DIAGNOSIS — I452 Bifascicular block: Secondary | ICD-10-CM | POA: Diagnosis present

## 2022-10-02 DIAGNOSIS — Z950 Presence of cardiac pacemaker: Secondary | ICD-10-CM | POA: Insufficient documentation

## 2022-10-02 DIAGNOSIS — I453 Trifascicular block: Secondary | ICD-10-CM | POA: Diagnosis present

## 2022-10-02 NOTE — Progress Notes (Signed)
Patient Care Team: [place] Pink, MD as PCP - General (Family Medicine)   HPI  Anthony Palmer is a 71 y.o. 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  The patient denies chest pain, shortness of breath, nocturnal dyspnea, orthopnea or peripheral edema.  There have been no palpitations, lightheadedness or syncope.   He has a history of hypertension diabetes chronic kidney disease.  Working on exercise but weight not changing   The patient denies chest pain, shortness of breath, nocturnal dyspnea, orthopnea or peripheral edema.  There have been no palpitations, lightheadedness or syncope.    Echocardiogram 10/18-normal LV function  Date Cr K  Hgb HgbA1c  10/18 1.8 5.2  6.1  8/19 1.3 3.8  7.3  9/21 1.4 4.4  7.3    7/22 1.5 4.4  7.1  7/23 (CE)  1.6 4.2 15.6          Past Medical History:  Diagnosis Date   Arthritis    "hands, wrists" (10/16/2017)   Bilateral hydronephrosis    a. 08/2017 in setting of bladder outlet obstruction.   Bladder outlet obstruction    a. 08/2017 noted in setting of rising creatinine-->d/c'd w/ foley and plan for outpt urology f/u.   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 08/2017 Echo: EF 60-65%, no rwma, Gr1 DD, nl RV fxn; b. 08/2017 carotid u/s: unremarkable.   Trifascicular block    Trigger finger, acquired    Type II diabetes mellitus ([place])     Past Surgical History:  Procedure Laterality Date   COLONOSCOPY N/A 12/22/2021   Procedure: COLONOSCOPY;  Surgeon: [name], MD;  Location[facility]  ENDOSCOPY;  Service: Endoscopy;  Laterality: N/A;   COLONOSCOPY WITH PROPOFOL N/A 07/23/2016   Procedure: COLONOSCOPY WITH PROPOFOL;  Surgeon: Lollie Sails, MD;  Location[facility] ENDOSCOPY;  Service: Endoscopy;  Laterality: N/A;   COLONOSCOPY WITH PROPOFOL N/A 09/09/2020   Procedure: COLONOSCOPY WITH PROPOFOL;  Surgeon: [name], MD;  Location: [facility] ENDOSCOPY;  Service: Gastroenterology;  Laterality: N/A;   CYSTOSCOPY W/ RETROGRADES Bilateral 11/11/2017   Procedure: CYSTOSCOPY WITH RETROGRADE PYELOGRAM;  Surgeon: Hollice Espy, MD;  Location: [facility] ORS;  Service: Urology;  Laterality: Bilateral;   CYSTOSCOPY WITH FULGERATION N/A 11/13/2017   Procedure: CYSTOSCOPY WITH FULGERATION;  Surgeon: Hollice Espy, MD;  Location: [facility] ORS;  Service: Urology;  Laterality: N/A;   INCISION AND DRAINAGE Left 2013   Hematoma left leg   INSERT / REPLACE / REMOVE PACEMAKER  10/16/2017   PACEMAKER IMPLANT N/A 10/16/2017   Procedure: PACEMAKER IMPLANT;  Surgeon: Deboraha Sprang, MD;  Location: [place] CV LAB;  Service: Cardiovascular;  Laterality: N/A;   SKIN CANCER EXCISION     "off my back"   TRANSURETHRAL RESECTION OF PROSTATE N/A 11/11/2017   Procedure: TRANSURETHRAL RESECTION OF THE PROSTATE (TURP);  Surgeon: Hollice Espy, MD;  Location: [facility] ORS;  Service: Urology;  Laterality: N/A;  Current Outpatient Medications  Medication Sig Dispense Refill   allopurinol (ZYLOPRIM) 300 MG tablet Take 300 mg by mouth daily.     chlorthalidone (HYGROTON) 25 MG tablet TAKE 1 TABLET BY MOUTH EVERY DAY IN THE MORNING  11   Cholecalciferol 50 MCG (2000 UT) TABS Take 2,000 Units by mouth daily.     dapagliflozin propanediol (FARXIGA) 5 MG TABS tablet Take 5 mg by mouth daily.     fluticasone (FLONASE) 50 MCG/ACT nasal spray Place 1 spray into both nostrils in the morning and at bedtime.     glipiZIDE (GLUCOTROL XL) 5 MG 24 hr tablet Take 5 mg by mouth daily.     losartan (COZAAR) 25 MG tablet  Take 25 mg by mouth daily.     lovastatin (MEVACOR) 40 MG tablet Take 40 mg by mouth at bedtime.     Multiple Vitamin (MULTIVITAMIN WITH MINERALS) TABS tablet Take 1 tablet by mouth daily. Taking one a day mens     Probiotic Product (PROBIOTIC-10 PO) Take 1 tablet by mouth daily.     Propylene Glycol (SYSTANE COMPLETE) 0.6 % SOLN Apply 1 drop to eye daily.     terbinafine (LAMISIL) 250 MG tablet Take 1 tablet (250 mg total) by mouth 3 (three) times a week. Take M-W-F 12 tablet 1   No current facility-administered medications for this visit.    No Known Allergies    Review of Systems negative except from HPI and PMH  Physical Exam BP 126/74   Pulse (!) 59   Ht '5\' 7"'$  (1.702 m)   Wt 215 lb (97.5 kg)   SpO2 97%   BMI 33.67 kg/m  Well developed and well nourished in no acute distress HENT normal Neck supple with JVP-flat Clear Device pocket well healed; without hematoma or erythema.  There is no tethering  Regular rate and rhythm, no murmur Abd-soft with active BS No Clubbing cyanosis   edema Skin-warm and dry A & Oriented  Grossly normal sensory and motor function  ECG atrial pacing at 60 Normal 26/14/45 Right bundle branch block left axis deviation Septal Q waves  ECG a pacing @ 61 22/13/43 RBBB/LAD  PRWP Septal q waves  Assessment and  Plan  Syncope-recurrent  Trifascicular block  Pacemaker-Medtronic   Hypertension  Diabetes  Obesity  No interval syncope.  Has lost 30 pounds.  Continue to work on weight loss.  Blood pressure is much improved we will continue chlorthalidone and losartan.  The patient's device was interrogated.  The information was reviewed. No changes were made in the programming.

## 2022-10-02 NOTE — Patient Instructions (Signed)
Medication Instructions:  - Your physician recommends that you continue on your current medications as directed. Please refer to the Current Medication list given to you today.  *If you need a refill on your cardiac medications before your next appointment, please call your pharmacy*   Lab Work: - none ordered  If you have labs (blood work) drawn today and your tests are completely normal, you will receive your results only by: MyChart Message (if you have MyChart) OR A paper copy in the mail If you have any lab test that is abnormal or we need to change your treatment, we will call you to review the results.   Testing/Procedures: - none ordered   Follow-Up: At [facility], you and your health needs are our priority.  As part of our continuing mission to provide you with exceptional heart care, we have created designated Provider Care Teams.  These Care Teams include your primary Cardiologist (physician) and Advanced Practice Providers (APPs -  Physician Assistants and Nurse Practitioners) who all work together to provide you with the care you need, when you need it.  We recommend signing up for the patient portal called "MyChart".  Sign up information is provided on this After Visit Summary.  MyChart is used to connect with patients for Virtual Visits (Telemedicine).  Patients are able to view lab/test results, encounter notes, upcoming appointments, etc.  Non-urgent messages can be sent to your provider as well.   To learn more about what you can do with MyChart, go to [email or web address].    Your next appointment:   1 year(s)  The format for your next appointment:   In Person  Provider:   Khalee Klein, MD    Other Instructions N/a  Important Information About Sugar       

## 2022-10-03 ENCOUNTER — Other Ambulatory Visit: Payer: Self-pay

## 2022-10-03 MED ORDER — COVID-19 MRNA 2023-2024 VACCINE (COMIRNATY) 0.3 ML INJECTION
INTRAMUSCULAR | 0 refills | Status: DC
  Filled 2022-10-04: qty 0.3, 1d supply, fill #0

## 2022-10-04 ENCOUNTER — Other Ambulatory Visit: Payer: Self-pay

## 2022-10-17 ENCOUNTER — Ambulatory Visit (INDEPENDENT_AMBULATORY_CARE_PROVIDER_SITE_OTHER): Payer: Medicare Other

## 2022-10-17 DIAGNOSIS — I452 Bifascicular block: Secondary | ICD-10-CM | POA: Diagnosis not present

## 2022-10-18 LAB — CUP PACEART REMOTE DEVICE CHECK
Battery Remaining Longevity: 119 mo
Battery Voltage: 3.01 V
Brady Statistic AP VP Percent: 0.14 %
Brady Statistic AP VS Percent: 42.49 %
Brady Statistic AS VP Percent: 0.02 %
Brady Statistic AS VS Percent: 57.34 %
Brady Statistic RA Percent Paced: 42.73 %
Brady Statistic RV Percent Paced: 0.17 %
Date Time Interrogation Session: 20231025003423
Implantable Lead Connection Status: 753985
Implantable Lead Connection Status: 753985
Implantable Lead Implant Date: 20181024
Implantable Lead Implant Date: 20181024
Implantable Lead Location: 753859
Implantable Lead Location: 753860
Implantable Lead Model: 3830
Implantable Lead Model: 5076
Implantable Pulse Generator Implant Date: 20181024
Lead Channel Impedance Value: 361 Ohm
Lead Channel Impedance Value: 380 Ohm
Lead Channel Impedance Value: 418 Ohm
Lead Channel Impedance Value: 456 Ohm
Lead Channel Pacing Threshold Amplitude: 0.5 V
Lead Channel Pacing Threshold Amplitude: 1.125 V
Lead Channel Pacing Threshold Pulse Width: 0.4 ms
Lead Channel Pacing Threshold Pulse Width: 0.4 ms
Lead Channel Sensing Intrinsic Amplitude: 3.375 mV
Lead Channel Sensing Intrinsic Amplitude: 3.375 mV
Lead Channel Sensing Intrinsic Amplitude: 4.625 mV
Lead Channel Sensing Intrinsic Amplitude: 4.625 mV
Lead Channel Setting Pacing Amplitude: 1.5 V
Lead Channel Setting Pacing Amplitude: 2 V
Lead Channel Setting Pacing Pulse Width: 1 ms
Lead Channel Setting Sensing Sensitivity: 0.9 mV
Zone Setting Status: 755011
Zone Setting Status: 755011

## 2022-10-30 NOTE — Progress Notes (Signed)
Remote pacemaker transmission.   

## 2022-11-16 ENCOUNTER — Other Ambulatory Visit: Payer: Self-pay | Admitting: Dermatology

## 2022-11-16 DIAGNOSIS — B354 Tinea corporis: Secondary | ICD-10-CM

## 2023-01-16 ENCOUNTER — Ambulatory Visit: Payer: Medicare Other | Attending: Internal Medicine

## 2023-01-16 DIAGNOSIS — I452 Bifascicular block: Secondary | ICD-10-CM | POA: Diagnosis not present

## 2023-01-16 LAB — CUP PACEART REMOTE DEVICE CHECK
Battery Remaining Longevity: 117 mo
Battery Voltage: 3 V
Brady Statistic AP VP Percent: 0.14 %
Brady Statistic AP VS Percent: 44.43 %
Brady Statistic AS VP Percent: 0.03 %
Brady Statistic AS VS Percent: 55.4 %
Brady Statistic RA Percent Paced: 44.65 %
Brady Statistic RV Percent Paced: 0.17 %
Date Time Interrogation Session: 20240123233441
Implantable Lead Connection Status: 753985
Implantable Lead Connection Status: 753985
Implantable Lead Implant Date: 20181024
Implantable Lead Implant Date: 20181024
Implantable Lead Location: 753859
Implantable Lead Location: 753860
Implantable Lead Model: 3830
Implantable Lead Model: 5076
Implantable Pulse Generator Implant Date: 20181024
Lead Channel Impedance Value: 342 Ohm
Lead Channel Impedance Value: 380 Ohm
Lead Channel Impedance Value: 456 Ohm
Lead Channel Impedance Value: 456 Ohm
Lead Channel Pacing Threshold Amplitude: 0.5 V
Lead Channel Pacing Threshold Amplitude: 1.125 V
Lead Channel Pacing Threshold Pulse Width: 0.4 ms
Lead Channel Pacing Threshold Pulse Width: 0.4 ms
Lead Channel Sensing Intrinsic Amplitude: 3.75 mV
Lead Channel Sensing Intrinsic Amplitude: 3.75 mV
Lead Channel Sensing Intrinsic Amplitude: 4.5 mV
Lead Channel Sensing Intrinsic Amplitude: 4.5 mV
Lead Channel Setting Pacing Amplitude: 1.5 V
Lead Channel Setting Pacing Amplitude: 2 V
Lead Channel Setting Pacing Pulse Width: 1 ms
Lead Channel Setting Sensing Sensitivity: 0.9 mV
Zone Setting Status: 755011
Zone Setting Status: 755011

## 2023-02-07 NOTE — Progress Notes (Signed)
Remote pacemaker transmission.   

## 2023-04-08 ENCOUNTER — Ambulatory Visit (INDEPENDENT_AMBULATORY_CARE_PROVIDER_SITE_OTHER): Payer: Medicare Other | Admitting: Dermatology

## 2023-04-08 VITALS — BP 121/76 | HR 60

## 2023-04-08 DIAGNOSIS — L578 Other skin changes due to chronic exposure to nonionizing radiation: Secondary | ICD-10-CM

## 2023-04-08 DIAGNOSIS — L821 Other seborrheic keratosis: Secondary | ICD-10-CM

## 2023-04-08 DIAGNOSIS — Z1283 Encounter for screening for malignant neoplasm of skin: Secondary | ICD-10-CM

## 2023-04-08 DIAGNOSIS — L720 Epidermal cyst: Secondary | ICD-10-CM | POA: Diagnosis not present

## 2023-04-08 DIAGNOSIS — Z85820 Personal history of malignant melanoma of skin: Secondary | ICD-10-CM | POA: Diagnosis not present

## 2023-04-08 DIAGNOSIS — D229 Melanocytic nevi, unspecified: Secondary | ICD-10-CM

## 2023-04-08 DIAGNOSIS — L814 Other melanin hyperpigmentation: Secondary | ICD-10-CM

## 2023-04-08 DIAGNOSIS — L729 Follicular cyst of the skin and subcutaneous tissue, unspecified: Secondary | ICD-10-CM

## 2023-04-08 DIAGNOSIS — D225 Melanocytic nevi of trunk: Secondary | ICD-10-CM | POA: Diagnosis not present

## 2023-04-08 DIAGNOSIS — D1801 Hemangioma of skin and subcutaneous tissue: Secondary | ICD-10-CM

## 2023-04-08 MED ORDER — DOXYCYCLINE MONOHYDRATE 100 MG PO CAPS: 100.0000 mg | ORAL_CAPSULE | Freq: Two times a day (BID) | ORAL | 0 refills | Status: DC

## 2023-04-08 MED ORDER — MUPIROCIN 2 % EX OINT: TOPICAL_OINTMENT | CUTANEOUS | 0 refills | Status: AC

## 2023-04-08 NOTE — Progress Notes (Signed)
Follow-Up Visit   Subjective  Anthony Palmer is a 72 y.o. male who presents for the following: Skin Cancer Screening and Full Body Skin Exam  The patient presents for Total-Body Skin Exam (TBSE) for skin cancer screening and mole check. The patient has spots, moles and lesions to be evaluated, some may be new or changing. He has a history of melanoma of the back treated in 2018. He also has a spot to check in the groin area. He felt it last week, a little tender at first, but improving.    The following portions of the chart were reviewed this encounter and updated as appropriate: medications, allergies, medical history  Review of Systems:  No other skin or systemic complaints except as noted in HPI or Assessment and Plan.  Objective  Well appearing patient in no apparent distress; mood and affect are within normal limits.  A full examination was performed including scalp, head, eyes, ears, nose, lips, neck, chest, axillae, abdomen, back, buttocks, bilateral upper extremities, bilateral lower extremities, hands, feet, fingers, toes, fingernails, and toenails. All findings within normal limits unless otherwise noted below.   Relevant physical exam findings are noted in the Assessment and Plan.                               Assessment & Plan   LENTIGINES, SEBORRHEIC KERATOSES, HEMANGIOMAS - Benign normal skin lesions - Benign-appearing - Call for any changes   MELANOCYTIC NEVI - Tan-brown and/or pink-flesh-colored symmetric macules and papules, see photos - left spinal mid back 5.0 mm speckled med dark brown macule - mid upper abdomen 0.8 cm med dark brown macule - Benign appearing on exam today - Observation - Call clinic for new or changing moles - Recommend daily use of broad spectrum spf 30+ sunscreen to sun-exposed areas.   ACTINIC DAMAGE - Chronic condition, secondary to cumulative UV/sun exposure - diffuse scaly erythematous macules with  underlying dyspigmentation - Recommend daily broad spectrum sunscreen SPF 30+ to sun-exposed areas, reapply every 2 hours as needed.  - Staying in the shade or wearing long sleeves, sun glasses (UVA+UVB protection) and wide brim hats (4-inch brim around the entire circumference of the hat) are also recommended for sun protection.  - Call for new or changing lesions.  SKIN CANCER SCREENING PERFORMED TODAY.  HISTORY OF MELANOMA - back 2018 - No evidence of recurrence today - Recommend regular full body skin exams - Recommend daily broad spectrum sunscreen SPF 30+ to sun-exposed areas, reapply every 2 hours as needed.  - Call if any new or changing lesions are noted between office visits  EPIDERMAL INCLUSION CYST Exam: Subcutaneous nodule at right posterior flank, 1.2 CM  Benign-appearing. Exam most consistent with an epidermal inclusion cyst. Discussed that a cyst is a benign growth that can grow over time and sometimes get irritated or inflamed. Recommend observation if it is not bothersome. Discussed option of surgical excision to remove it if it is growing, symptomatic, or other changes noted. Please call for new or changing lesions so they can be evaluated.  EPIDERMAL INCLUSION CYST, inflamed Exam: 1.2 CM firm pink white nodule  Benign-appearing. Exam most consistent with an epidermal inclusion cyst. Discussed that a cyst is a benign growth that can grow over time and sometimes get irritated or inflamed. Recommend observation if it is not bothersome. Discussed option of surgical excision to remove it if it is growing, symptomatic, or other  changes noted. Please call for new or changing lesions so they can be evaluated.  Start Doxycycline 100 mg take 1 po BID with food x 2 weeks, then decrease to 1 po QD until finished dsp 60 0Rf. Start Mupirocin 2% oinement Apply to AA BID until improved dsp 22g 0Rf.  Doxycycline should be taken with food to prevent nausea. Do not lay down for 30 minutes  after taking. Be cautious with sun exposure and use good sun protection while on this medication. Pregnant women should not take this medication.       Return in about 6 months (around 10/08/2023) for recheck moles, hx melanoma.  ICherlyn Labella, CMA, am acting as scribe for Willeen Niece, MD .   Documentation: I have reviewed the above documentation for accuracy and completeness, and I agree with the above.  Willeen Niece, MD

## 2023-04-08 NOTE — Patient Instructions (Addendum)
Start Doxycycline 100 MG take 1 capsule by mouth twice a day x 2 weeks, then decrease to once a day until finished. Doxycycline should be taken with food to prevent nausea. Do not lay down for 30 minutes after taking. Be cautious with sun exposure and use good sun protection while on this medication. Pregnant women should not take this medication.    Start Mupirocin 2% Ointment Apply to cyst in groin twice daily until improved.   Melanoma ABCDEs  Melanoma is the most dangerous type of skin cancer, and is the leading cause of death from skin disease.  You are more likely to develop melanoma if you: Have light-colored skin, light-colored eyes, or red or blond hair Spend a lot of time in the sun Tan regularly, either outdoors or in a tanning bed Have had blistering sunburns, especially during childhood Have a close family member who has had a melanoma Have atypical moles or large birthmarks  Early detection of melanoma is key since treatment is typically straightforward and cure rates are extremely high if we catch it early.   The first sign of melanoma is often a change in a mole or a new dark spot.  The ABCDE system is a way of remembering the signs of melanoma.  A for asymmetry:  The two halves do not match. B for border:  The edges of the growth are irregular. C for color:  A mixture of colors are present instead of an even brown color. D for diameter:  Melanomas are usually (but not always) greater than 6mm - the size of a pencil eraser. E for evolution:  The spot keeps changing in size, shape, and color.  Please check your skin once per month between visits. You can use a small mirror in front and a large mirror behind you to keep an eye on the back side or your body.   If you see any new or changing lesions before your next follow-up, please call to schedule a visit.  Please continue daily skin protection including broad spectrum sunscreen SPF 30+ to sun-exposed areas, reapplying  every 2 hours as needed when you're outdoors.   Staying in the shade or wearing long sleeves, sun glasses (UVA+UVB protection) and wide brim hats (4-inch brim around the entire circumference of the hat) are also recommended for sun protection.    Due to recent changes in healthcare laws, you may see results of your pathology and/or laboratory studies on MyChart before the doctors have had a chance to review them. We understand that in some cases there may be results that are confusing or concerning to you. Please understand that not all results are received at the same time and often the doctors may need to interpret multiple results in order to provide you with the best plan of care or course of treatment. Therefore, we ask that you please give [place] 2 business days to thoroughly review all your results before contacting the office for clarification. Should we see a critical lab result, you will be contacted sooner.   If You Need Anything After Your Visit  If you have any questions or concerns for your doctor, please call our main line at [phone] and press option 4 to reach your doctor's medical assistant. If no one answers, please leave a voicemail as directed and we will return your call as soon as possible. Messages left after 4 pm will be answered the following business day.   You may also send [place] a message  via MyChart. We typically respond to MyChart messages within 1-2 business days.  For prescription refills, please ask your pharmacy to contact our office. Our fax number is [phone].  If you have an urgent issue when the clinic is closed that cannot wait until the next business day, you can page your doctor at the number below.    Please note that while we do our best to be available for urgent issues outside of office hours, we are not available 24/7.   If you have an urgent issue and are unable to reach [place], you may choose to seek medical care at your doctor's office, retail clinic,  urgent care center, or emergency room.  If you have a medical emergency, please immediately call 911 or go to the emergency department.  Pager Numbers  - Dr. Gwen Pounds: [phone]  - Dr. Neale Burly: [phone]  - Dr. Roseanne Reno: [phone]  In the event of inclement weather, please call our main line at [phone] for an update on the status of any delays or closures.  Dermatology Medication Tips: Please keep the boxes that topical medications come in in order to help keep track of the instructions about where and how to use these. Pharmacies typically print the medication instructions only on the boxes and not directly on the medication tubes.   If your medication is too expensive, please contact our office at [phone] option 4 or send [place] a message through MyChart.   We are unable to tell what your co-pay for medications will be in advance as this is different depending on your insurance coverage. However, we may be able to find a substitute medication at lower cost or fill out paperwork to get insurance to cover a needed medication.   If a prior authorization is required to get your medication covered by your insurance company, please allow [place] 1-2 business days to complete this process.  Drug prices often vary depending on where the prescription is filled and some pharmacies may offer cheaper prices.  The website www.goodrx.com contains coupons for medications through different pharmacies. The prices here do not account for what the cost may be with help from insurance (it may be cheaper with your insurance), but the website can give you the price if you did not use any insurance.  - You can print the associated coupon and take it with your prescription to the pharmacy.  - You may also stop by our office during regular business hours and pick up a GoodRx coupon card.  - If you need your prescription sent electronically to a different pharmacy, notify our office through [facility] or by phone at [phone] option 4.     Si Usted Necesita Algo Despus de Su Visita  Tambin puede enviarnos un mensaje a travs de Clinical cytogeneticist. Por lo general respondemos a los mensajes de MyChart en el transcurso de 1 a 2 das hbiles.  Para renovar recetas, por favor pida a su farmacia que se ponga en contacto con nuestra oficina. Annie Sable de fax es [place] [phone].  Si tiene un asunto urgente cuando la clnica est cerrada y que no puede esperar hasta el siguiente da hbil, puede llamar/localizar a su doctor(a) al nmero que aparece a continuacin.   Por favor, tenga en cuenta que aunque hacemos todo lo posible para estar disponibles para asuntos urgentes fuera del horario de [place], no estamos disponibles las 24 horas del da, los 7 [address]  [address] de la [place].   Si tiene un problema urgente y  no puede comunicarse con nosotros, puede optar por buscar atencin mdica  en el consultorio de su doctor(a), en una clnica privada, en un centro de atencin urgente o en una sala de emergencias.  Si tiene Engineer, drilling, por favor llame inmediatamente al 911 o vaya a la sala de emergencias.  Nmeros de bper  - Dr. Gwen Pounds: [phone]  - Dra. Moye: [phone]  - Dra. Roseanne Reno: [phone]  En caso de inclemencias del [place], por favor llame a Lacy Duverney principal al [phone] para una actualizacin sobre el [place] de cualquier retraso o cierre.  Consejos para la medicacin en dermatologa: Por favor, guarde las cajas en las que vienen los medicamentos de uso tpico para ayudarle a seguir las instrucciones sobre dnde y cmo usarlos. Las farmacias generalmente imprimen las instrucciones del medicamento slo en las cajas y no directamente en los tubos del [place].   Si su medicamento es muy caro, por favor, pngase en contacto con Rolm Gala llamando al [phone] y presione la opcin 4 o envenos un mensaje a travs de Clinical cytogeneticist.   No podemos decirle cul  ser su copago por los medicamentos por adelantado ya que esto es diferente dependiendo de la cobertura de su seguro. Sin embargo, es posible que podamos encontrar un medicamento sustituto a Audiological scientist un formulario para que el seguro cubra el medicamento que se considera necesario.   Si se requiere una autorizacin previa para que su compaa de seguros [place] su medicamento, por favor permtanos de 1 a 2 das hbiles para completar [address]  Los precios de los medicamentos varan con frecuencia dependiendo del Environmental consultant de dnde se surte la receta y alguna farmacias pueden ofrecer precios ms baratos.  El sitio web www.goodrx.com tiene cupones para medicamentos de Health and safety inspector. Los precios aqu no tienen en cuenta lo que podra costar con la ayuda del seguro (puede ser ms barato con su seguro), pero el sitio web puede darle el precio si no utiliz Tourist information centre manager.  - Puede imprimir el cupn correspondiente y llevarlo con su receta a la farmacia.  - Tambin puede pasar por nuestra oficina durante el horario de atencin regular y Education officer, museum una tarjeta de cupones de GoodRx.  - Si necesita que su receta se enve electrnicamente a una farmacia diferente, informe a nuestra oficina a travs de MyChart de [place] o por telfono llamando al [phone] y presione la opcin 4.

## 2023-04-17 ENCOUNTER — Ambulatory Visit (INDEPENDENT_AMBULATORY_CARE_PROVIDER_SITE_OTHER): Payer: Medicare Other

## 2023-04-17 DIAGNOSIS — I452 Bifascicular block: Secondary | ICD-10-CM | POA: Diagnosis not present

## 2023-04-17 LAB — CUP PACEART REMOTE DEVICE CHECK
Battery Remaining Longevity: 115 mo
Battery Voltage: 3 V
Brady Statistic AP VP Percent: 0.08 %
Brady Statistic AP VS Percent: 43.32 %
Brady Statistic AS VP Percent: 0.04 %
Brady Statistic AS VS Percent: 56.56 %
Brady Statistic RA Percent Paced: 43.48 %
Brady Statistic RV Percent Paced: 0.12 %
Date Time Interrogation Session: 20240424003608
Implantable Lead Connection Status: 753985
Implantable Lead Connection Status: 753985
Implantable Lead Implant Date: 20181024
Implantable Lead Implant Date: 20181024
Implantable Lead Location: 753859
Implantable Lead Location: 753860
Implantable Lead Model: 3830
Implantable Lead Model: 5076
Implantable Pulse Generator Implant Date: 20181024
Lead Channel Impedance Value: 342 Ohm
Lead Channel Impedance Value: 380 Ohm
Lead Channel Impedance Value: 437 Ohm
Lead Channel Impedance Value: 437 Ohm
Lead Channel Pacing Threshold Amplitude: 0.5 V
Lead Channel Pacing Threshold Amplitude: 1.125 V
Lead Channel Pacing Threshold Pulse Width: 0.4 ms
Lead Channel Pacing Threshold Pulse Width: 0.4 ms
Lead Channel Sensing Intrinsic Amplitude: 4.125 mV
Lead Channel Sensing Intrinsic Amplitude: 4.125 mV
Lead Channel Sensing Intrinsic Amplitude: 4.5 mV
Lead Channel Sensing Intrinsic Amplitude: 4.5 mV
Lead Channel Setting Pacing Amplitude: 1.5 V
Lead Channel Setting Pacing Amplitude: 2 V
Lead Channel Setting Pacing Pulse Width: 1 ms
Lead Channel Setting Sensing Sensitivity: 0.9 mV
Zone Setting Status: 755011
Zone Setting Status: 755011

## 2023-05-14 NOTE — Progress Notes (Signed)
Remote pacemaker transmission.   

## 2023-06-24 ENCOUNTER — Telehealth: Payer: Self-pay | Admitting: Cardiology

## 2023-06-24 NOTE — Telephone Encounter (Signed)
Leave voice mal to reschedule LEA pt needs 2 hrs slot for this test

## 2023-06-24 NOTE — Telephone Encounter (Signed)
Please disregard made in error °

## 2023-07-17 ENCOUNTER — Ambulatory Visit (INDEPENDENT_AMBULATORY_CARE_PROVIDER_SITE_OTHER): Payer: Medicare Other

## 2023-07-17 DIAGNOSIS — I452 Bifascicular block: Secondary | ICD-10-CM

## 2023-07-18 LAB — CUP PACEART REMOTE DEVICE CHECK
Battery Remaining Longevity: 113 mo
Battery Voltage: 3 V
Brady Statistic AP VP Percent: 0.11 %
Brady Statistic AP VS Percent: 42.51 %
Brady Statistic AS VP Percent: 0.03 %
Brady Statistic RA Percent Paced: 42.73 %
Brady Statistic RV Percent Paced: 0.14 %
Date Time Interrogation Session: 20240724003454
Implantable Lead Connection Status: 753985
Implantable Lead Implant Date: 20181024
Implantable Lead Implant Date: 20181024
Implantable Lead Location: 753859
Implantable Lead Location: 753860
Implantable Lead Model: 3830
Implantable Lead Model: 5076
Implantable Pulse Generator Implant Date: 20181024
Lead Channel Impedance Value: 342 Ohm
Lead Channel Impedance Value: 380 Ohm
Lead Channel Impedance Value: 437 Ohm
Lead Channel Pacing Threshold Amplitude: 0.5 V
Lead Channel Pacing Threshold Pulse Width: 0.4 ms
Lead Channel Pacing Threshold Pulse Width: 0.4 ms
Lead Channel Sensing Intrinsic Amplitude: 4.125 mV
Lead Channel Sensing Intrinsic Amplitude: 4.125 mV
Lead Channel Sensing Intrinsic Amplitude: 4.25 mV
Lead Channel Setting Pacing Amplitude: 1.5 V
Lead Channel Setting Pacing Amplitude: 2 V
Lead Channel Setting Pacing Pulse Width: 1 ms
Lead Channel Setting Sensing Sensitivity: 0.9 mV
Zone Setting Status: 755011
Zone Setting Status: 755011

## 2023-07-30 NOTE — Progress Notes (Signed)
Remote pacemaker transmission.   

## 2023-09-03 ENCOUNTER — Encounter: Payer: Self-pay | Admitting: Internal Medicine

## 2023-09-03 ENCOUNTER — Ambulatory Visit: Payer: Medicare Other | Attending: Internal Medicine | Admitting: Internal Medicine

## 2023-09-03 VITALS — BP 120/78 | HR 60 | Ht 67.0 in | Wt 218.2 lb

## 2023-09-03 DIAGNOSIS — I453 Trifascicular block: Secondary | ICD-10-CM | POA: Diagnosis not present

## 2023-09-03 DIAGNOSIS — Z950 Presence of cardiac pacemaker: Secondary | ICD-10-CM | POA: Diagnosis not present

## 2023-09-03 DIAGNOSIS — I452 Bifascicular block: Secondary | ICD-10-CM | POA: Diagnosis present

## 2023-09-03 DIAGNOSIS — R001 Bradycardia, unspecified: Secondary | ICD-10-CM | POA: Insufficient documentation

## 2023-09-03 NOTE — Progress Notes (Unsigned)
Patient Care Team: Jerl Mina, MD as PCP - General (Family Medicine)   HPI  Anthony Palmer is a 72 y.o. 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  The patient denies chest pain, shortness of breath, nocturnal dyspnea, orthopnea or peripheral edema.  There have been no palpitations, lightheadedness or syncope.   He has a history of hypertension diabetes chronic kidney disease.  Working on exercise but weight not changing   The patient denies chest pain, shortness of breath, nocturnal dyspnea, orthopnea or peripheral edema.  There have been no palpitations, lightheadedness or syncope.    Echocardiogram 10/18-normal LV function  Date Cr K  Hgb HgbA1c  10/18 1.8 5.2  6.1  8/19 1.3 3.8  7.3  9/21 1.4 4.4  7.3    7/22 1.5 4.4  7.1  7/23 (CE)  1.6 4.2 15.6          Past Medical History:  Diagnosis Date   Arthritis    "hands, wrists" (10/16/2017)   Bilateral hydronephrosis    a. 08/2017 in setting of bladder outlet obstruction.   Bladder outlet obstruction    a. 08/2017 noted in setting of rising creatinine-->d/c'd w/ foley and plan for outpt urology f/u.   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 08/2017 Echo: EF 60-65%, no rwma, Gr1 DD, nl RV fxn; b. 08/2017 carotid u/s: unremarkable.   Trifascicular block    Trigger finger, acquired    Type II diabetes mellitus (HCC)     Past Surgical History:  Procedure Laterality Date   COLONOSCOPY N/A 12/22/2021   Procedure: COLONOSCOPY;  Surgeon: Regis Bill, MD;  [facility]  ENDOSCOPY;  Service: Endoscopy;  Laterality: N/A;   COLONOSCOPY WITH PROPOFOL N/A 07/23/2016   Procedure: COLONOSCOPY WITH PROPOFOL;  Surgeon: Christena Deem, MD;  Location: [facility] ENDOSCOPY;  Service: Endoscopy;  Laterality: N/A;   COLONOSCOPY WITH PROPOFOL N/A 09/09/2020   Procedure: COLONOSCOPY WITH PROPOFOL;  Surgeon: Regis Bill, MD;  Location: [facility] ENDOSCOPY;  Service: Gastroenterology;  Laterality: N/A;   CYSTOSCOPY W/ RETROGRADES Bilateral 11/11/2017   Procedure: CYSTOSCOPY WITH RETROGRADE PYELOGRAM;  Surgeon: Vanna Scotland, MD;  Location: [facility] ORS;  Service: Urology;  Laterality: Bilateral;   CYSTOSCOPY WITH FULGERATION N/A 11/13/2017   Procedure: CYSTOSCOPY WITH FULGERATION;  Surgeon: Vanna Scotland, MD;  Location: [facility] ORS;  Service: Urology;  Laterality: N/A;   INCISION AND DRAINAGE Left 2013   Hematoma left leg   INSERT / REPLACE / REMOVE PACEMAKER  10/16/2017   PACEMAKER IMPLANT N/A 10/16/2017   Procedure: PACEMAKER IMPLANT;  Surgeon: Duke Salvia, MD;  Location: [facility] INVASIVE CV LAB;  Service: Cardiovascular;  Laterality: N/A;   SKIN CANCER EXCISION     "off my back"   TRANSURETHRAL RESECTION OF PROSTATE N/A 11/11/2017   Procedure: TRANSURETHRAL RESECTION OF THE PROSTATE (TURP);  Surgeon: Vanna Scotland, MD;  Location: [facility] ORS;  Service: Urology;  Laterality: N/A;  Current Outpatient Medications  Medication Sig Dispense Refill   acetaminophen (TYLENOL) 500 MG tablet Take by mouth as needed for moderate pain.     allopurinol (ZYLOPRIM) 300 MG tablet Take 300 mg by mouth daily.     chlorthalidone (HYGROTON) 25 MG tablet TAKE 1 TABLET BY MOUTH EVERY DAY IN THE MORNING  11   Cholecalciferol 50 MCG (2000 UT) TABS Take 2,000 Units by mouth daily.     dapagliflozin propanediol (FARXIGA) 10 MG TABS tablet Take by mouth daily.     Finerenone (KERENDIA) 10 MG TABS Take by mouth daily at 6 (six) AM.     fluticasone (FLONASE) 50 MCG/ACT nasal spray Place 1 spray into  both nostrils in the morning and at bedtime.     glipiZIDE (GLUCOTROL XL) 5 MG 24 hr tablet Take 5 mg by mouth daily.     losartan (COZAAR) 25 MG tablet Take 25 mg by mouth daily.     lovastatin (MEVACOR) 40 MG tablet Take 40 mg by mouth at bedtime.     Multiple Vitamin (MULTIVITAMIN WITH MINERALS) TABS tablet Take 1 tablet by mouth daily. Taking one a day mens     mupirocin ointment (BACTROBAN) 2 % Apply to affected area twice a day until improved. 22 g 0   Probiotic Product (PROBIOTIC-10 PO) Take 1 tablet by mouth daily.     Propylene Glycol (SYSTANE COMPLETE) 0.6 % SOLN Apply 1 drop to eye daily.     No current facility-administered medications for this visit.    No Known Allergies    Review of Systems negative except from HPI and PMH  Physical Exam BP 120/78   Pulse 60   Ht 5\' 7"  (1.702 m)   Wt 218 lb 3.2 oz (99 kg)   SpO2 97%   BMI 34.17 kg/m  Well developed and well nourished in no acute distress HENT normal Neck supple with JVP-flat Clear Device pocket well healed; without hematoma or erythema.  There is no tethering  Regular rate and rhythm, no *** gallop No ***/*** murmur Abd-soft with active BS No Clubbing cyanosis *** edema Skin-warm and dry A & Oriented  Grossly normal sensory and motor function  ECG atrial pacing at 60 Intervals 26/14/42  Device function is normal. ***Programming changes ***  See Paceart for details awareness  Assessment and  Plan  Syncope-recurrent  Trifascicular block  Pacemaker-Medtronic   Hypertension  Diabetes  Obesity    No interval syncope  Blood pressure is well-controlled on the combination of Kerendia and Farxiga, and losartan.  He remains euvolemic on his chlorthalidone.

## 2023-09-03 NOTE — Patient Instructions (Signed)
Medication Instructions:  The current medical regimen is effective;  continue present plan and medications.  *If you need a refill on your cardiac medications before your next appointment, please call your pharmacy*   Follow-Up: At [facility], you and your health needs are our priority.  As part of our continuing mission to provide you with exceptional heart care, we have created designated Provider Care Teams.  These Care Teams include your primary Cardiologist (physician) and Advanced Practice Providers (APPs -  Physician Assistants and Nurse Practitioners) who all work together to provide you with the care you need, when you need it.  We recommend signing up for the patient portal called "MyChart".  Sign up information is provided on this After Visit Summary.  MyChart is used to connect with patients for Virtual Visits (Telemedicine).  Patients are able to view lab/test results, encounter notes, upcoming appointments, etc.  Non-urgent messages can be sent to your provider as well.   To learn more about what you can do with MyChart, go to [email or web address].    Your next appointment:   12 month(s)  Provider:   Sherryl Manges, MD

## 2023-10-16 ENCOUNTER — Ambulatory Visit (INDEPENDENT_AMBULATORY_CARE_PROVIDER_SITE_OTHER): Payer: Medicare Other

## 2023-10-16 DIAGNOSIS — I452 Bifascicular block: Secondary | ICD-10-CM

## 2023-10-17 LAB — CUP PACEART REMOTE DEVICE CHECK
Battery Remaining Longevity: 109 mo
Battery Voltage: 2.99 V
Brady Statistic AP VP Percent: 0.37 %
Brady Statistic AP VS Percent: 53.39 %
Brady Statistic AS VP Percent: 0.03 %
Brady Statistic AS VS Percent: 46.21 %
Brady Statistic RA Percent Paced: 53.85 %
Brady Statistic RV Percent Paced: 0.4 %
Date Time Interrogation Session: 20241023003536
Implantable Lead Connection Status: 753985
Implantable Lead Connection Status: 753985
Implantable Lead Implant Date: 20181024
Implantable Lead Implant Date: 20181024
Implantable Lead Location: 753859
Implantable Lead Location: 753860
Implantable Lead Model: 3830
Implantable Lead Model: 5076
Implantable Pulse Generator Implant Date: 20181024
Lead Channel Impedance Value: 323 Ohm
Lead Channel Impedance Value: 361 Ohm
Lead Channel Impedance Value: 399 Ohm
Lead Channel Impedance Value: 437 Ohm
Lead Channel Pacing Threshold Amplitude: 0.375 V
Lead Channel Pacing Threshold Amplitude: 1.125 V
Lead Channel Pacing Threshold Pulse Width: 0.4 ms
Lead Channel Pacing Threshold Pulse Width: 0.4 ms
Lead Channel Sensing Intrinsic Amplitude: 2.75 mV
Lead Channel Sensing Intrinsic Amplitude: 2.75 mV
Lead Channel Sensing Intrinsic Amplitude: 3.375 mV
Lead Channel Sensing Intrinsic Amplitude: 3.375 mV
Lead Channel Setting Pacing Amplitude: 1.5 V
Lead Channel Setting Pacing Amplitude: 2 V
Lead Channel Setting Pacing Pulse Width: 1 ms
Lead Channel Setting Sensing Sensitivity: 0.9 mV
Zone Setting Status: 755011
Zone Setting Status: 755011

## 2023-10-24 ENCOUNTER — Ambulatory Visit: Payer: Medicare Other | Admitting: Dermatology

## 2023-10-24 ENCOUNTER — Encounter: Payer: Self-pay | Admitting: Dermatology

## 2023-10-24 DIAGNOSIS — L720 Epidermal cyst: Secondary | ICD-10-CM

## 2023-10-24 DIAGNOSIS — Z1283 Encounter for screening for malignant neoplasm of skin: Secondary | ICD-10-CM | POA: Diagnosis not present

## 2023-10-24 DIAGNOSIS — L817 Pigmented purpuric dermatosis: Secondary | ICD-10-CM

## 2023-10-24 DIAGNOSIS — D1801 Hemangioma of skin and subcutaneous tissue: Secondary | ICD-10-CM

## 2023-10-24 DIAGNOSIS — L814 Other melanin hyperpigmentation: Secondary | ICD-10-CM

## 2023-10-24 DIAGNOSIS — L729 Follicular cyst of the skin and subcutaneous tissue, unspecified: Secondary | ICD-10-CM

## 2023-10-24 DIAGNOSIS — D229 Melanocytic nevi, unspecified: Secondary | ICD-10-CM

## 2023-10-24 DIAGNOSIS — W908XXA Exposure to other nonionizing radiation, initial encounter: Secondary | ICD-10-CM

## 2023-10-24 DIAGNOSIS — L820 Inflamed seborrheic keratosis: Secondary | ICD-10-CM

## 2023-10-24 DIAGNOSIS — L719 Rosacea, unspecified: Secondary | ICD-10-CM

## 2023-10-24 DIAGNOSIS — L578 Other skin changes due to chronic exposure to nonionizing radiation: Secondary | ICD-10-CM | POA: Diagnosis not present

## 2023-10-24 DIAGNOSIS — L821 Other seborrheic keratosis: Secondary | ICD-10-CM

## 2023-10-24 DIAGNOSIS — Z85820 Personal history of malignant melanoma of skin: Secondary | ICD-10-CM

## 2023-10-24 DIAGNOSIS — Z86018 Personal history of other benign neoplasm: Secondary | ICD-10-CM

## 2023-10-24 DIAGNOSIS — Z7189 Other specified counseling: Secondary | ICD-10-CM

## 2023-10-24 NOTE — Progress Notes (Signed)
Follow-Up Visit   Subjective  Anthony Palmer is a 72 y.o. male who presents for the following: Skin Cancer Screening and Full Body Skin Exam hx of Melanoma  The patient presents for Total-Body Skin Exam (TBSE) for skin cancer screening and mole check. The patient has spots, moles and lesions to be evaluated, some may be new or changing and the patient may have concern these could be cancer.    The following portions of the chart were reviewed this encounter and updated as appropriate: medications, allergies, medical history  Review of Systems:  No other skin or systemic complaints except as noted in HPI or Assessment and Plan.  Objective  Well appearing patient in no apparent distress; mood and affect are within normal limits.  A full examination was performed including scalp, head, eyes, ears, nose, lips, neck, chest, axillae, abdomen, back, buttocks, bilateral upper extremities, bilateral lower extremities, hands, feet, fingers, toes, fingernails, and toenails. All findings within normal limits unless otherwise noted below.   Relevant physical exam findings are noted in the Assessment and Plan.  R chest x 1 Stuck on waxy paps with erythema    Assessment & Plan   SKIN CANCER SCREENING PERFORMED TODAY.  ACTINIC DAMAGE - Chronic condition, secondary to cumulative UV/sun exposure - diffuse scaly erythematous macules with underlying dyspigmentation - Recommend daily broad spectrum sunscreen SPF 30+ to sun-exposed areas, reapply every 2 hours as needed.  - Staying in the shade or wearing long sleeves, sun glasses (UVA+UVB protection) and wide brim hats (4-inch brim around the entire circumference of the hat) are also recommended for sun protection.  - Call for new or changing lesions.  LENTIGINES, SEBORRHEIC KERATOSES, HEMANGIOMAS - Benign normal skin lesions - Benign-appearing - Call for any changes  MELANOCYTIC NEVI - Tan-brown and/or pink-flesh-colored symmetric macules and  papules - Benign appearing on exam today - Observation - Call clinic for new or changing moles - Recommend daily use of broad spectrum spf 30+ sunscreen to sun-exposed areas.   EPIDERMAL INCLUSION CYST R low back lat Exam: Subcutaneous nodule at R low back lat 1.5cm  Benign-appearing. Exam most consistent with an epidermal inclusion cyst. Discussed that a cyst is a benign growth that can grow over time and sometimes get irritated or inflamed. Recommend observation if it is not bothersome. Discussed option of surgical excision to remove it if it is growing, symptomatic, or other changes noted. Please call for new or changing lesions so they can be evaluated.  HISTORY OF MELANOMA - No evidence of recurrence today - No lymphadenopathy - Recommend regular full body skin exams - Recommend daily broad spectrum sunscreen SPF 30+ to sun-exposed areas, reapply every 2 hours as needed.  - Call if any new or changing lesions are noted between office visits  - R upper back, txted yrs ago by Dr. Orson Aloe  HISTORY OF DYSPLASTIC NEVUS No evidence of recurrence today Recommend regular full body skin exams Recommend daily broad spectrum sunscreen SPF 30+ to sun-exposed areas, reapply every 2 hours as needed.  Call if any new or changing lesions are noted between office visits  - Upper back, L shoulder  SCHAMBURG'S PURPURA Lower legs Exam: stasis changes involving the ankle and distal lower legs with associated lower leg edema.  Chronic and persistent condition with duration or expected duration over one year. Condition is symptomatic/ bothersome to patient. Not currently at goal.   Stasis in the legs causes chronic leg swelling, which may result in itchy or painful  rashes, skin discoloration, skin texture changes, and sometimes ulceration.  Recommend daily graduated compression hose/stockings- easiest to put on first thing in morning, remove at bedtime.  Elevate legs as much as possible. Avoid  salt/sodium rich foods.  Treatment Plan: Recommend compression socks  ROSACEA Exam Mid face erythema with telangiectasias  Chronic and persistent condition with duration or expected duration over one year. Condition is symptomatic/ bothersome to patient. Not currently at goal.   Rosacea is a chronic progressive skin condition usually affecting the face of adults, causing redness and/or acne bumps. It is treatable but not curable. It sometimes affects the eyes (ocular rosacea) as well. It may respond to topical and/or systemic medication and can flare with stress, sun exposure, alcohol, exercise, topical steroids (including hydrocortisone/cortisone 10) and some foods.  Daily application of broad spectrum spf 30+ sunscreen to face is recommended to reduce flares.  Patient denies grittiness of the eyes  Treatment Plan Mild, no treatment at this time   Inflamed seborrheic keratosis R chest x 1  Symptomatic, irritating, patient would like treated.  Destruction of lesion - R chest x 1 Complexity: simple   Destruction method: cryotherapy   Informed consent: discussed and consent obtained   Timeout:  patient name, date of birth, surgical site, and procedure verified Lesion destroyed using liquid nitrogen: Yes   Region frozen until ice ball extended beyond lesion: Yes   Outcome: patient tolerated procedure well with no complications   Post-procedure details: wound care instructions given     Return in about 1 year (around 10/23/2024) for TBSE.  I, Ardis Rowan, RMA, am acting as scribe for Armida Sans, MD .   Documentation: I have reviewed the above documentation for accuracy and completeness, and I agree with the above.  Armida Sans, MD

## 2023-10-24 NOTE — Patient Instructions (Addendum)

## 2023-11-03 ENCOUNTER — Encounter: Payer: Self-pay | Admitting: Dermatology

## 2023-11-04 NOTE — Progress Notes (Signed)
Remote pacemaker transmission.   

## 2024-01-15 ENCOUNTER — Ambulatory Visit (INDEPENDENT_AMBULATORY_CARE_PROVIDER_SITE_OTHER): Payer: Medicare Other

## 2024-01-15 DIAGNOSIS — R001 Bradycardia, unspecified: Secondary | ICD-10-CM | POA: Diagnosis not present

## 2024-01-15 LAB — CUP PACEART REMOTE DEVICE CHECK
Battery Remaining Longevity: 108 mo
Battery Voltage: 2.99 V
Brady Statistic AP VP Percent: 0.14 %
Brady Statistic AP VS Percent: 47.8 %
Brady Statistic AS VP Percent: 0.03 %
Brady Statistic AS VS Percent: 52.04 %
Brady Statistic RA Percent Paced: 48.03 %
Brady Statistic RV Percent Paced: 0.17 %
Date Time Interrogation Session: 20250121233448
Implantable Lead Connection Status: 753985
Implantable Lead Connection Status: 753985
Implantable Lead Implant Date: 20181024
Implantable Lead Implant Date: 20181024
Implantable Lead Location: 753859
Implantable Lead Location: 753860
Implantable Lead Model: 3830
Implantable Lead Model: 5076
Implantable Pulse Generator Implant Date: 20181024
Lead Channel Impedance Value: 342 Ohm
Lead Channel Impedance Value: 361 Ohm
Lead Channel Impedance Value: 418 Ohm
Lead Channel Impedance Value: 437 Ohm
Lead Channel Pacing Threshold Amplitude: 0.5 V
Lead Channel Pacing Threshold Amplitude: 1 V
Lead Channel Pacing Threshold Pulse Width: 0.4 ms
Lead Channel Pacing Threshold Pulse Width: 0.4 ms
Lead Channel Sensing Intrinsic Amplitude: 3.5 mV
Lead Channel Sensing Intrinsic Amplitude: 3.5 mV
Lead Channel Sensing Intrinsic Amplitude: 3.75 mV
Lead Channel Sensing Intrinsic Amplitude: 3.75 mV
Lead Channel Setting Pacing Amplitude: 1.5 V
Lead Channel Setting Pacing Amplitude: 2 V
Lead Channel Setting Pacing Pulse Width: 1 ms
Lead Channel Setting Sensing Sensitivity: 0.9 mV
Zone Setting Status: 755011
Zone Setting Status: 755011

## 2024-02-24 ENCOUNTER — Encounter: Payer: Self-pay | Admitting: Internal Medicine

## 2024-02-28 NOTE — Progress Notes (Signed)
 Remote pacemaker transmission.

## 2024-04-15 ENCOUNTER — Ambulatory Visit (INDEPENDENT_AMBULATORY_CARE_PROVIDER_SITE_OTHER): Payer: Medicare Other

## 2024-04-15 DIAGNOSIS — R001 Bradycardia, unspecified: Secondary | ICD-10-CM | POA: Diagnosis not present

## 2024-04-16 LAB — CUP PACEART REMOTE DEVICE CHECK
Battery Remaining Longevity: 104 mo
Battery Voltage: 2.99 V
Brady Statistic AP VP Percent: 0.12 %
Brady Statistic AP VS Percent: 48.21 %
Brady Statistic AS VP Percent: 0.03 %
Brady Statistic AS VS Percent: 51.64 %
Brady Statistic RA Percent Paced: 48.59 %
Brady Statistic RV Percent Paced: 0.15 %
Date Time Interrogation Session: 20250423003421
Implantable Lead Connection Status: 753985
Implantable Lead Connection Status: 753985
Implantable Lead Implant Date: 20181024
Implantable Lead Implant Date: 20181024
Implantable Lead Location: 753859
Implantable Lead Location: 753860
Implantable Lead Model: 3830
Implantable Lead Model: 5076
Implantable Pulse Generator Implant Date: 20181024
Lead Channel Impedance Value: 342 Ohm
Lead Channel Impedance Value: 380 Ohm
Lead Channel Impedance Value: 418 Ohm
Lead Channel Impedance Value: 456 Ohm
Lead Channel Pacing Threshold Amplitude: 0.375 V
Lead Channel Pacing Threshold Amplitude: 1.125 V
Lead Channel Pacing Threshold Pulse Width: 0.4 ms
Lead Channel Pacing Threshold Pulse Width: 0.4 ms
Lead Channel Sensing Intrinsic Amplitude: 4 mV
Lead Channel Sensing Intrinsic Amplitude: 4 mV
Lead Channel Sensing Intrinsic Amplitude: 4.125 mV
Lead Channel Sensing Intrinsic Amplitude: 4.125 mV
Lead Channel Setting Pacing Amplitude: 1.5 V
Lead Channel Setting Pacing Amplitude: 2 V
Lead Channel Setting Pacing Pulse Width: 1 ms
Lead Channel Setting Sensing Sensitivity: 0.9 mV
Zone Setting Status: 755011
Zone Setting Status: 755011

## 2024-04-26 ENCOUNTER — Encounter: Payer: Self-pay | Admitting: Internal Medicine

## 2024-05-28 NOTE — Progress Notes (Signed)
 Remote pacemaker transmission.

## 2024-05-28 NOTE — Addendum Note (Signed)
 Addended by: Lott Rouleau A on: 05/28/2024 10:50 AM   Modules accepted: Orders

## 2024-07-15 ENCOUNTER — Ambulatory Visit: Payer: Medicare Other

## 2024-07-15 DIAGNOSIS — R001 Bradycardia, unspecified: Secondary | ICD-10-CM | POA: Diagnosis not present

## 2024-07-16 LAB — CUP PACEART REMOTE DEVICE CHECK
Battery Remaining Longevity: 102 mo
Battery Voltage: 2.99 V
Brady Statistic AP VP Percent: 0.17 %
Brady Statistic AP VS Percent: 50.53 %
Brady Statistic AS VP Percent: 0.02 %
Brady Statistic AS VS Percent: 49.28 %
Brady Statistic RA Percent Paced: 50.96 %
Brady Statistic RV Percent Paced: 0.19 %
Date Time Interrogation Session: 20250723004534
Implantable Lead Connection Status: 753985
Implantable Lead Connection Status: 753985
Implantable Lead Implant Date: 20181024
Implantable Lead Implant Date: 20181024
Implantable Lead Location: 753859
Implantable Lead Location: 753860
Implantable Lead Model: 3830
Implantable Lead Model: 5076
Implantable Pulse Generator Implant Date: 20181024
Lead Channel Impedance Value: 323 Ohm
Lead Channel Impedance Value: 361 Ohm
Lead Channel Impedance Value: 399 Ohm
Lead Channel Impedance Value: 437 Ohm
Lead Channel Pacing Threshold Amplitude: 0.5 V
Lead Channel Pacing Threshold Amplitude: 1 V
Lead Channel Pacing Threshold Pulse Width: 0.4 ms
Lead Channel Pacing Threshold Pulse Width: 0.4 ms
Lead Channel Sensing Intrinsic Amplitude: 3.375 mV
Lead Channel Sensing Intrinsic Amplitude: 3.375 mV
Lead Channel Sensing Intrinsic Amplitude: 3.625 mV
Lead Channel Sensing Intrinsic Amplitude: 3.625 mV
Lead Channel Setting Pacing Amplitude: 1.5 V
Lead Channel Setting Pacing Amplitude: 2 V
Lead Channel Setting Pacing Pulse Width: 1 ms
Lead Channel Setting Sensing Sensitivity: 0.9 mV
Zone Setting Status: 755011
Zone Setting Status: 755011

## 2024-07-19 ENCOUNTER — Ambulatory Visit: Payer: Self-pay | Admitting: Cardiology

## 2024-09-06 NOTE — Progress Notes (Signed)
 Electrophysiology Clinic Note    Date:  09/07/2024  Patient ID:  Anthony Palmer, Anthony Palmer [birth year], MRN [id number] PCP:  Valora Agent, MD  Cardiologist:  None   Electrophysiologist:  Fonda Kitty, MD  Electrophysiology APP:  Keelon Zurn, NP    Discussed the use of AI scribe software for clinical note transcription with the patient, who gave verbal consent to proceed.   Patient Profile    Chief Complaint: routine PPM follow-up  History of Present Illness: Anthony Palmer is a 73 y.o. male with PMH notable for syncope, trifascicular HB s/p PPM, HTN, CKD - 3, HLD, T2DM; seen today for Fonda Kitty, MD for routine electrophysiology followup.   He last saw Dr. Fernande 08/2023 for routine follow-up, doing well without recurrent syncope.   On follow-up today, he is doing very well without cardiac complaints.  He does question whether remote transmission are happening as the device blinks different colors throughout the night. He has not had any syncopal episodes. He denies chest pain, palpitations, dyspnea, PND, orthopnea, nausea, vomiting, dizziness, syncope, edema, weight gain, or early satiety.      Arrhythmia/Device History Medtronic dual chamber PPM, imp 2018; HB, syncope RV lead = BUNDLE OF HIS    ROS:  Please see the history of present illness. All other systems are reviewed and otherwise negative.    Physical Exam    VS:  BP (!) 124/90 (BP Location: Left Arm, Patient Position: Sitting, Cuff Size: Normal)   Pulse 62   Resp 17   Ht 5' 8 (1.727 m)   Wt 217 lb (98.4 kg)   SpO2 99%   BMI 32.99 kg/m  BMI: Body mass index is 32.99 kg/m.      Wt Readings from Last 3 Encounters:  09/07/24 217 lb (98.4 kg)  09/03/23 218 lb 3.2 oz (99 kg)  10/02/22 215 lb (97.5 kg)     GEN- The patient is well appearing, alert and oriented x 3 today.   Lungs- Clear to ausculation bilaterally, normal work of breathing.  Heart- Regular rate and rhythm, no murmurs, rubs or  gallops Extremities- No peripheral edema, warm, dry Skin-  device pocket well-healed, no tethering   Device interrogation done today and reviewed by myself:  Battery 8.2 years Lead thresholds, impedence, sensing stable  No episodes No changes made today   Studies Reviewed   Previous EP, cardiology notes.    EKG is ordered. Personal review of EKG from today shows:    EKG Interpretation Date/Time:  Monday September 07 2024 10:43:21 EDT Ventricular Rate:  60 PR Interval:  248 QRS Duration:  138 QT Interval:  424 QTC Calculation: 424 R Axis:   -60  Text Interpretation: Atrial-paced rhythm with prolonged AV conduction Right bundle branch block Left anterior fascicular block Bifascicular block Confirmed by Rhayne Chatwin [phone]) on 09/07/2024 10:48:19 AM     TTE, 09/22/2017 - Left ventricle: The cavity size was normal. Systolic function was    normal. The estimated ejection fraction was in the range of 60%    to 65%. Wall motion was normal; there were no regional wall    motion abnormalities. Doppler parameters are consistent with    abnormal left ventricular relaxation (grade 1 diastolic    dysfunction).  - Left atrium: The atrium was normal in size.  - Right ventricle: Systolic function was normal.  - Pulmonary arteries: Systolic pressure was within the normal    range.     Assessment and Plan     #)  syncope #) trifascicular block  #) s/p PPM Device functioning well, see paceart for details Stable lead measurements Low VP No recurrence of syncope       Current medicines are reviewed at length with the patient today.   The patient does not have concerns regarding his medicines.  The following changes were made today:  none  Labs/ tests ordered today include:  Orders Placed This Encounter  Procedures   EKG 12-Lead     Disposition: Follow up with Dr. Kennyth or EP APP in 2 years , continue remote monitoring   Signed, Chantal Needle, NP  09/07/24  12:41 PM   Electrophysiology [facility]

## 2024-09-07 ENCOUNTER — Ambulatory Visit: Attending: Cardiology | Admitting: Cardiology

## 2024-09-07 ENCOUNTER — Encounter: Payer: Self-pay | Admitting: Cardiology

## 2024-09-07 VITALS — BP 124/90 | HR 62 | Resp 17 | Ht 68.0 in | Wt 217.0 lb

## 2024-09-07 DIAGNOSIS — R55 Syncope and collapse: Secondary | ICD-10-CM | POA: Diagnosis not present

## 2024-09-07 DIAGNOSIS — I453 Trifascicular block: Secondary | ICD-10-CM | POA: Insufficient documentation

## 2024-09-07 DIAGNOSIS — Z950 Presence of cardiac pacemaker: Secondary | ICD-10-CM | POA: Insufficient documentation

## 2024-09-07 LAB — CUP PACEART INCLINIC DEVICE CHECK
Battery Remaining Longevity: 99 mo
Battery Voltage: 2.99 V
Brady Statistic AP VP Percent: 0.18 %
Brady Statistic AP VS Percent: 49.15 %
Brady Statistic AS VP Percent: 0.03 %
Brady Statistic AS VS Percent: 50.64 %
Brady Statistic RA Percent Paced: 49.51 %
Brady Statistic RV Percent Paced: 0.21 %
Date Time Interrogation Session: 20250915130318
Implantable Lead Connection Status: 753985
Implantable Lead Connection Status: 753985
Implantable Lead Implant Date: 20181024
Implantable Lead Implant Date: 20181024
Implantable Lead Location: 753859
Implantable Lead Location: 753860
Implantable Lead Model: 3830
Implantable Lead Model: 5076
Implantable Pulse Generator Implant Date: 20181024
Lead Channel Impedance Value: 342 Ohm
Lead Channel Impedance Value: 399 Ohm
Lead Channel Impedance Value: 437 Ohm
Lead Channel Impedance Value: 456 Ohm
Lead Channel Pacing Threshold Amplitude: 0.5 V
Lead Channel Pacing Threshold Amplitude: 1 V
Lead Channel Pacing Threshold Pulse Width: 0.4 ms
Lead Channel Pacing Threshold Pulse Width: 0.4 ms
Lead Channel Sensing Intrinsic Amplitude: 4.125 mV
Lead Channel Sensing Intrinsic Amplitude: 4.125 mV
Lead Channel Sensing Intrinsic Amplitude: 4.125 mV
Lead Channel Sensing Intrinsic Amplitude: 4.125 mV
Lead Channel Setting Pacing Amplitude: 1.5 V
Lead Channel Setting Pacing Amplitude: 2 V
Lead Channel Setting Pacing Pulse Width: 1 ms
Lead Channel Setting Sensing Sensitivity: 0.9 mV
Zone Setting Status: 755011
Zone Setting Status: 755011

## 2024-09-07 NOTE — Patient Instructions (Signed)
 Medication Instructions:  Your physician recommends that you continue on your current medications as directed. Please refer to the Current Medication list given to you today.   *If you need a refill on your cardiac medications before your next appointment, please call your pharmacy*  Lab Work: None ordered at this time  If you have labs (blood work) drawn today and your tests are completely normal, you will receive your results only by: MyChart Message (if you have MyChart) OR A paper copy in the mail If you have any lab test that is abnormal or we need to change your treatment, we will call you to review the results.  Testing/Procedures: None ordered at this time   Follow-Up: At [facility], you and your health needs are our priority.  As part of our continuing mission to provide you with exceptional heart care, our providers are all part of one team.  This team includes your primary Cardiologist (physician) and Advanced Practice Providers or APPs (Physician Assistants and Nurse Practitioners) who all work together to provide you with the care you need, when you need it.  Your next appointment:   2 year(s)  Provider:   Fonda Kitty, MD    We recommend signing up for the patient portal called MyChart.  Sign up information is provided on this After Visit Summary.  MyChart is used to connect with patients for Virtual Visits (Telemedicine).  Patients are able to view lab/test results, encounter notes, upcoming appointments, etc.  Non-urgent messages can be sent to your provider as well.   To learn more about what you can do with MyChart, go to [email or web address].

## 2024-09-13 ENCOUNTER — Ambulatory Visit: Payer: Self-pay | Admitting: Cardiology

## 2024-09-25 NOTE — Progress Notes (Signed)
 Remote PPM Transmission

## 2024-10-14 ENCOUNTER — Ambulatory Visit: Payer: Medicare Other

## 2024-10-14 DIAGNOSIS — R001 Bradycardia, unspecified: Secondary | ICD-10-CM | POA: Diagnosis not present

## 2024-10-15 LAB — CUP PACEART REMOTE DEVICE CHECK
Battery Remaining Longevity: 97 mo
Battery Voltage: 2.98 V
Brady Statistic AP VP Percent: 0.16 %
Brady Statistic AP VS Percent: 49.7 %
Brady Statistic AS VP Percent: 0.03 %
Brady Statistic AS VS Percent: 50.12 %
Brady Statistic RA Percent Paced: 50 %
Brady Statistic RV Percent Paced: 0.18 %
Date Time Interrogation Session: 20251022002109
Implantable Lead Connection Status: 753985
Implantable Lead Connection Status: 753985
Implantable Lead Implant Date: 20181024
Implantable Lead Implant Date: 20181024
Implantable Lead Location: 753859
Implantable Lead Location: 753860
Implantable Lead Model: 3830
Implantable Lead Model: 5076
Implantable Pulse Generator Implant Date: 20181024
Lead Channel Impedance Value: 342 Ohm
Lead Channel Impedance Value: 361 Ohm
Lead Channel Impedance Value: 399 Ohm
Lead Channel Impedance Value: 456 Ohm
Lead Channel Pacing Threshold Amplitude: 0.5 V
Lead Channel Pacing Threshold Amplitude: 1.125 V
Lead Channel Pacing Threshold Pulse Width: 0.4 ms
Lead Channel Pacing Threshold Pulse Width: 0.4 ms
Lead Channel Sensing Intrinsic Amplitude: 3.625 mV
Lead Channel Sensing Intrinsic Amplitude: 3.625 mV
Lead Channel Sensing Intrinsic Amplitude: 4.125 mV
Lead Channel Sensing Intrinsic Amplitude: 4.125 mV
Lead Channel Setting Pacing Amplitude: 1.5 V
Lead Channel Setting Pacing Amplitude: 2 V
Lead Channel Setting Pacing Pulse Width: 1 ms
Lead Channel Setting Sensing Sensitivity: 0.9 mV
Zone Setting Status: 755011
Zone Setting Status: 755011

## 2024-10-16 NOTE — Progress Notes (Signed)
 Remote PPM Transmission

## 2024-10-17 ENCOUNTER — Ambulatory Visit: Payer: Self-pay | Admitting: Cardiology

## 2024-10-28 ENCOUNTER — Ambulatory Visit: Payer: Medicare Other | Admitting: Dermatology

## 2025-01-13 ENCOUNTER — Ambulatory Visit

## 2025-01-13 DIAGNOSIS — R001 Bradycardia, unspecified: Secondary | ICD-10-CM

## 2025-01-14 LAB — CUP PACEART REMOTE DEVICE CHECK
Battery Remaining Longevity: 95 mo
Battery Voltage: 2.98 V
Brady Statistic AP VP Percent: 0.07 %
Brady Statistic AP VS Percent: 49.4 %
Brady Statistic AS VP Percent: 0.03 %
Brady Statistic AS VS Percent: 50.5 %
Brady Statistic RA Percent Paced: 49.66 %
Brady Statistic RV Percent Paced: 0.1 %
Date Time Interrogation Session: 20260120233443
Implantable Lead Connection Status: 753985
Implantable Lead Connection Status: 753985
Implantable Lead Implant Date: 20181024
Implantable Lead Implant Date: 20181024
Implantable Lead Location: 753859
Implantable Lead Location: 753860
Implantable Lead Model: 3830
Implantable Lead Model: 5076
Implantable Pulse Generator Implant Date: 20181024
Lead Channel Impedance Value: 342 Ohm
Lead Channel Impedance Value: 361 Ohm
Lead Channel Impedance Value: 399 Ohm
Lead Channel Impedance Value: 456 Ohm
Lead Channel Pacing Threshold Amplitude: 0.5 V
Lead Channel Pacing Threshold Amplitude: 1.125 V
Lead Channel Pacing Threshold Pulse Width: 0.4 ms
Lead Channel Pacing Threshold Pulse Width: 0.4 ms
Lead Channel Sensing Intrinsic Amplitude: 3.625 mV
Lead Channel Sensing Intrinsic Amplitude: 3.625 mV
Lead Channel Sensing Intrinsic Amplitude: 4.125 mV
Lead Channel Sensing Intrinsic Amplitude: 4.125 mV
Lead Channel Setting Pacing Amplitude: 1.5 V
Lead Channel Setting Pacing Amplitude: 2 V
Lead Channel Setting Pacing Pulse Width: 1 ms
Lead Channel Setting Sensing Sensitivity: 0.9 mV
Zone Setting Status: 755011
Zone Setting Status: 755011

## 2025-01-16 NOTE — Progress Notes (Signed)
 Remote PPM Transmission

## 2025-01-18 ENCOUNTER — Ambulatory Visit: Payer: Self-pay | Admitting: Cardiology

## 2025-04-02 ENCOUNTER — Ambulatory Visit: Admit: 2025-04-02

## 2025-04-02 SURGERY — COLONOSCOPY
Anesthesia: General

## 2025-04-14 ENCOUNTER — Ambulatory Visit

## 2025-07-14 ENCOUNTER — Ambulatory Visit

## 2025-10-13 ENCOUNTER — Ambulatory Visit

## 2026-01-12 ENCOUNTER — Ambulatory Visit
